# Patient Record
Sex: Female | Born: 1935 | Race: White | Hispanic: No | Marital: Married | Smoking: Never smoker
Health system: Southern US, Community
[De-identification: ages and names within clinical notes are randomized; demographics above are authoritative.]

## PROBLEM LIST (undated history)

## (undated) DIAGNOSIS — N2581 Secondary hyperparathyroidism of renal origin: Secondary | ICD-10-CM

## (undated) DIAGNOSIS — G2581 Restless legs syndrome: Secondary | ICD-10-CM

## (undated) DIAGNOSIS — I209 Angina pectoris, unspecified: Secondary | ICD-10-CM

## (undated) DIAGNOSIS — D631 Anemia in chronic kidney disease: Secondary | ICD-10-CM

## (undated) DIAGNOSIS — D509 Iron deficiency anemia, unspecified: Secondary | ICD-10-CM

## (undated) DIAGNOSIS — Z860100 Personal history of colon polyps, unspecified: Secondary | ICD-10-CM

## (undated) DIAGNOSIS — M1990 Unspecified osteoarthritis, unspecified site: Secondary | ICD-10-CM

## (undated) DIAGNOSIS — N039 Chronic nephritic syndrome with unspecified morphologic changes: Secondary | ICD-10-CM

## (undated) DIAGNOSIS — K5730 Diverticulosis of large intestine without perforation or abscess without bleeding: Secondary | ICD-10-CM

## (undated) DIAGNOSIS — Z951 Presence of aortocoronary bypass graft: Secondary | ICD-10-CM

## (undated) DIAGNOSIS — F329 Major depressive disorder, single episode, unspecified: Secondary | ICD-10-CM

## (undated) DIAGNOSIS — N186 End stage renal disease: Secondary | ICD-10-CM

## (undated) DIAGNOSIS — F419 Anxiety disorder, unspecified: Secondary | ICD-10-CM

## (undated) DIAGNOSIS — I120 Hypertensive chronic kidney disease with stage 5 chronic kidney disease or end stage renal disease: Secondary | ICD-10-CM

## (undated) DIAGNOSIS — N200 Calculus of kidney: Secondary | ICD-10-CM

## (undated) DIAGNOSIS — C801 Malignant (primary) neoplasm, unspecified: Secondary | ICD-10-CM

## (undated) DIAGNOSIS — F32A Depression, unspecified: Secondary | ICD-10-CM

## (undated) DIAGNOSIS — F319 Bipolar disorder, unspecified: Secondary | ICD-10-CM

## (undated) DIAGNOSIS — I2510 Atherosclerotic heart disease of native coronary artery without angina pectoris: Secondary | ICD-10-CM

## (undated) HISTORY — PX: ABDOMINAL HYSTERECTOMY: SHX81

## (undated) HISTORY — DX: Anxiety disorder, unspecified: F41.9

## (undated) HISTORY — DX: Major depressive disorder, single episode, unspecified: F32.9

## (undated) HISTORY — DX: Depression, unspecified: F32.A

## (undated) HISTORY — PX: EYE SURGERY: SHX253

---

## 2000-09-17 ENCOUNTER — Emergency Department (HOSPITAL_COMMUNITY): Admission: EM | Admit: 2000-09-17 | Discharge: 2000-09-17 | Payer: Self-pay

## 2000-09-17 ENCOUNTER — Encounter: Payer: Self-pay | Admitting: Emergency Medicine

## 2000-11-12 ENCOUNTER — Encounter (HOSPITAL_COMMUNITY): Admission: RE | Admit: 2000-11-12 | Discharge: 2001-02-10 | Payer: Self-pay | Admitting: Nephrology

## 2002-01-18 ENCOUNTER — Encounter: Payer: Self-pay | Admitting: Vascular Surgery

## 2002-01-19 ENCOUNTER — Ambulatory Visit (HOSPITAL_COMMUNITY): Admission: RE | Admit: 2002-01-19 | Discharge: 2002-01-19 | Payer: Self-pay | Admitting: Vascular Surgery

## 2002-01-19 HISTORY — PX: AV FISTULA PLACEMENT: SHX1204

## 2002-03-11 ENCOUNTER — Ambulatory Visit (HOSPITAL_COMMUNITY): Admission: RE | Admit: 2002-03-11 | Discharge: 2002-03-11 | Payer: Self-pay | Admitting: Gastroenterology

## 2002-03-11 ENCOUNTER — Encounter (INDEPENDENT_AMBULATORY_CARE_PROVIDER_SITE_OTHER): Payer: Self-pay | Admitting: *Deleted

## 2003-07-01 ENCOUNTER — Ambulatory Visit (HOSPITAL_COMMUNITY): Admission: RE | Admit: 2003-07-01 | Discharge: 2003-07-01 | Payer: Self-pay | Admitting: Nephrology

## 2004-10-21 DIAGNOSIS — K5730 Diverticulosis of large intestine without perforation or abscess without bleeding: Secondary | ICD-10-CM

## 2004-10-21 HISTORY — DX: Diverticulosis of large intestine without perforation or abscess without bleeding: K57.30

## 2005-04-01 ENCOUNTER — Ambulatory Visit (HOSPITAL_COMMUNITY): Admission: RE | Admit: 2005-04-01 | Discharge: 2005-04-01 | Payer: Self-pay | Admitting: Gastroenterology

## 2005-06-14 ENCOUNTER — Ambulatory Visit (HOSPITAL_COMMUNITY): Admission: RE | Admit: 2005-06-14 | Discharge: 2005-06-14 | Payer: Self-pay | Admitting: Nephrology

## 2005-07-01 ENCOUNTER — Encounter (HOSPITAL_COMMUNITY): Admission: RE | Admit: 2005-07-01 | Discharge: 2005-09-29 | Payer: Self-pay | Admitting: Nephrology

## 2005-10-09 ENCOUNTER — Encounter (HOSPITAL_COMMUNITY): Admission: RE | Admit: 2005-10-09 | Discharge: 2006-01-07 | Payer: Self-pay | Admitting: Nephrology

## 2006-01-20 ENCOUNTER — Encounter (HOSPITAL_COMMUNITY): Admission: RE | Admit: 2006-01-20 | Discharge: 2006-04-20 | Payer: Self-pay | Admitting: Critical Care Medicine

## 2006-12-10 ENCOUNTER — Emergency Department (HOSPITAL_COMMUNITY): Admission: EM | Admit: 2006-12-10 | Discharge: 2006-12-10 | Payer: Self-pay | Admitting: Emergency Medicine

## 2007-02-16 ENCOUNTER — Emergency Department (HOSPITAL_COMMUNITY): Admission: EM | Admit: 2007-02-16 | Discharge: 2007-02-16 | Payer: Self-pay | Admitting: *Deleted

## 2008-12-16 ENCOUNTER — Ambulatory Visit: Payer: Self-pay | Admitting: Vascular Surgery

## 2009-07-21 HISTORY — PX: AV FISTULA PLACEMENT: SHX1204

## 2009-07-28 ENCOUNTER — Ambulatory Visit: Payer: Self-pay | Admitting: Vascular Surgery

## 2009-08-10 ENCOUNTER — Ambulatory Visit: Payer: Self-pay | Admitting: Vascular Surgery

## 2009-08-10 ENCOUNTER — Ambulatory Visit (HOSPITAL_COMMUNITY): Admission: RE | Admit: 2009-08-10 | Discharge: 2009-08-10 | Payer: Self-pay | Admitting: Vascular Surgery

## 2009-09-08 ENCOUNTER — Ambulatory Visit: Payer: Self-pay | Admitting: Vascular Surgery

## 2009-12-08 ENCOUNTER — Ambulatory Visit: Payer: Self-pay | Admitting: Vascular Surgery

## 2009-12-19 HISTORY — PX: ARTERIOVENOUS GRAFT PLACEMENT: SUR1029

## 2010-01-09 ENCOUNTER — Ambulatory Visit (HOSPITAL_COMMUNITY): Admission: RE | Admit: 2010-01-09 | Discharge: 2010-01-09 | Payer: Self-pay | Admitting: Vascular Surgery

## 2010-01-09 ENCOUNTER — Ambulatory Visit: Payer: Self-pay | Admitting: Vascular Surgery

## 2010-02-06 ENCOUNTER — Ambulatory Visit: Payer: Self-pay | Admitting: Vascular Surgery

## 2010-03-02 ENCOUNTER — Ambulatory Visit: Payer: Self-pay | Admitting: Vascular Surgery

## 2010-11-20 ENCOUNTER — Ambulatory Visit: Admit: 2010-11-20 | Payer: Self-pay | Admitting: Vascular Surgery

## 2010-11-20 ENCOUNTER — Ambulatory Visit
Admission: RE | Admit: 2010-11-20 | Discharge: 2010-11-20 | Payer: Self-pay | Source: Home / Self Care | Attending: Vascular Surgery | Admitting: Vascular Surgery

## 2010-11-21 HISTORY — PX: ARTERIOVENOUS GRAFT PLACEMENT: SUR1029

## 2010-11-21 NOTE — Assessment & Plan Note (Addendum)
OFFICE VISIT  Diane [name], Diane [name] DOB:  [birth year]                                       11/20/2010 ZOXWR#:[id number]  The patient presents today for continued discussion regarding AV access. I last saw her in May 2011.  At that time, she had a failed left forearm loop graft.  She has continued to have progressive renal failure and is now approaching hemodialysis, and we are asked to see her for access placement.  She has had prior failed right lower and upper AV fistula, and left forearm loop artificial graft.  She has had prior vein mapping in October 2010 showing that she does not have any other veins suitable for a fistula.  I discussed options with the patient and her husband, and have recommend that we proceed with left upper arm AV graft placement.  She understands that this will fail and average duration is 6 to 9 months between failure.  She remains quite tearful and very emotional regarding this decision to proceed with hemodialysis.  I explained that the access would not hasten her need for dialysis but would hopefully be available if she has initiation of hemodialysis.  We have scheduled this at her convenience on 2/10 at [facility] as an outpatient.    [name], M.D. Electronically Signed  TFE/MEDQ  D:  11/20/2010  T:  11/21/2010  Job:  [id number]  cc:   Fayrene Fearing L. Deterding, M.D. Barry Dienes Eloise Harman, M.D.

## 2010-11-30 ENCOUNTER — Ambulatory Visit (HOSPITAL_COMMUNITY): Payer: Medicare Other

## 2010-11-30 ENCOUNTER — Ambulatory Visit (HOSPITAL_COMMUNITY)
Admission: RE | Admit: 2010-11-30 | Discharge: 2010-11-30 | Disposition: A | Discharge status: Home / Self Care | Payer: Medicare Other | Source: Ambulatory Visit | Attending: Vascular Surgery | Admitting: Vascular Surgery

## 2010-11-30 DIAGNOSIS — I120 Hypertensive chronic kidney disease with stage 5 chronic kidney disease or end stage renal disease: Secondary | ICD-10-CM

## 2010-11-30 DIAGNOSIS — N186 End stage renal disease: Secondary | ICD-10-CM

## 2010-11-30 DIAGNOSIS — N189 Chronic kidney disease, unspecified: Secondary | ICD-10-CM | POA: Insufficient documentation

## 2010-11-30 LAB — POCT I-STAT 4, (NA,K, GLUC, HGB,HCT)
Hemoglobin: 12.2 g/dL (ref 12.0–15.0)
Potassium: 5 mEq/L (ref 3.5–5.1)
Sodium: 145 mEq/L (ref 135–145)

## 2010-11-30 LAB — SURGICAL PCR SCREEN
MRSA, PCR: NEGATIVE
Staphylococcus aureus: NEGATIVE

## 2010-12-03 NOTE — Op Note (Addendum)
  NAMEVALERIE, Diane [name]           ACCOUNT NO.:  [id number]  MEDICAL RECORD NO.:  [id number]           PATIENT TYPE:  O  LOCATION:  SDSC                         FACILITY:  [facility]  PHYSICIAN:  [name], M.D.    DATE OF BIRTH:  08-28-1935  DATE OF PROCEDURE:  11/30/2010 DATE OF DISCHARGE:  11/30/2010                              OPERATIVE REPORT   PREOPERATIVE DIAGNOSIS:  Chronic renal insufficiency.  POSTOPERATIVE DIAGNOSIS:  Chronic renal insufficiency.  PROCEDURE:  Left upper arm arteriovenous Gore-Tex graft placement.  SURGEON:  [name], MD  ASSISTANT:  Nurse.  ANESTHESIA:  MAC.  COMPLICATIONS:  None.  DISPOSITION:  To recovery room stable.  PROCEDURE IN DETAIL:  The patient was taken to the operating room and placed supine on the operating table.  The area of left arm and left axilla were prepped and draped in a usual sterile fashion.  An incision was made over the axillary pulse, carried down to isolate the axillary vein which was of good caliber.  A separate incision was made using local anesthesia just above the antecubital space over the brachial pulse.  The artery was of small caliber, did have good pulse and the patient did have a 1 to 2+ radial pulse at the left wrist.  A tunnel was created in the subcutaneous tissue from the antecubital space to the axilla.  Due to the small caliber of the artery, 4-7 tapered graft was chosen.  The artery was occluded proximal and distally, was opened with blade, and extended longitudinally with Potts scissors.  A small arteriotomy was created.  The graft was spatulated and a 4-mm portion of the graft was sewn end-to-side of the artery with a running 6-0 Prolene suture.  Clamps were removed from the artery.  The graft was flushed with heparinized saline and reoccluded.  Next, the axillary artery was occluded proximally and distally, opened with a 11 blade, and extended longitudinally with Potts scissors.  The graft  was cut to appropriate length and sewn end-to-side of the vein with a running 6-0 Prolene suture.  Clamps were removed and good flow was noted through the graft. The patient did have diminished, but palpable pulse at the left wrist with the graft open and listening with Doppler, there was Doppler flow which augmented with occlusion of the graft.  Wounds were irrigated with saline.  Hemostasis was obtained with electrocautery.  Wounds were closed with 3-0 Vicryl in the subcutaneous and subcuticular tissue.  Benzoin and Steri-Strips were applied.     [name], M.D.     TFE/MEDQ  D:  11/30/2010  T:  12/01/2010  Job:  [id number]  Electronically Signed by Oletha Tolson M.D. on 12/03/2010 07:41:47 PM

## 2010-12-25 ENCOUNTER — Ambulatory Visit (INDEPENDENT_AMBULATORY_CARE_PROVIDER_SITE_OTHER): Payer: MEDICARE | Admitting: Vascular Surgery

## 2010-12-25 DIAGNOSIS — N186 End stage renal disease: Secondary | ICD-10-CM

## 2010-12-26 NOTE — Assessment & Plan Note (Signed)
OFFICE VISIT  Diane, [name] DOB:  1934/11/16                                       12/25/2010 ZOXWR#:[id number]  The patient presents today for follow-up of her left upper arm AV Gore- Tex graft placement on December 02, 2010.  She looks quite good today. She has no steal symptoms.  She has had minimal discomfort with her upper arm graft.  She does report occasional episodes of a numb sensation in her entire left arm.  This is not related to her left hand.  PHYSICAL EXAMINATION:  She does have a 2+ radial pulse.  Her axillary and antecubital incisions are healed quite nicely.  Her graft has an excellent thrill and bruit.  I am quite pleased with her initial follow-up, recommend we see her again on a p.r.n. basis.    [name], M.D. Electronically Signed  TFE/MEDQ  D:  12/25/2010  T:  12/26/2010  Job:  [id number]  cc:   Fayrene Fearing L. Deterding, M.D. Barry Dienes Eloise Harman, M.D.

## 2011-01-11 LAB — POCT I-STAT 4, (NA,K, GLUC, HGB,HCT): HCT: 37 % (ref 36.0–46.0)

## 2011-01-24 LAB — POCT I-STAT 4, (NA,K, GLUC, HGB,HCT)
Glucose, Bld: 136 mg/dL — ABNORMAL HIGH (ref 70–99)
Potassium: 4.2 mEq/L (ref 3.5–5.1)
Sodium: 148 mEq/L — ABNORMAL HIGH (ref 135–145)

## 2011-03-05 NOTE — Procedures (Signed)
LOWER EXTREMITY ARTERIAL EVALUATION-SINGLE LEVEL   INDICATION:  Followup evaluation of bilateral burning and leg pain at  night.   HISTORY:  Diabetes:  Yes.  Cardiac:  No.  Hypertension:  Yes.  Smoking:  No.  Previous Surgery:  Occluded right arm AV fistula.  The patient is not  currently on dialysis.   RESTING SYSTOLIC PRESSURES: (ABI)                          RIGHT                LEFT  Brachial:               124                  118  Anterior tibial:        124                  122  Posterior tibial:       128 (>1.0)           126 (>1.0)  Peroneal:  DOPPLER WAVEFORM ANALYSIS:  Anterior tibial:        Triphasic            Triphasic  Posterior tibial:       Triphasic            Triphasic  Peroneal:   PREVIOUS ABI'S:  Date:  11/26/2005  RIGHT:  >1.0  LEFT:  >1.0   IMPRESSION:  1. Doppler waveforms are triphasic in the tibial arteries bilaterally.  2. ABIs suggest no significant arterial occlusive disease bilaterally.   ___________________________________________  [name], M.D.   MC/MEDQ  D:  12/16/2008  T:  12/16/2008  Job:  [id number]

## 2011-03-05 NOTE — Assessment & Plan Note (Signed)
OFFICE VISIT   CAREN, GARSKE  DOB:  1935-02-22                                       12/08/2009  ZOXWR#:[id number]   Patient presents today for continued follow-up of AV access.  She had a  right upper arm AV fistula creation by myself in October 2010.  She had  a prior Cimino fistula done by myself in 2003.  She fortunately does not  need hemodialysis.  On my last visit in November, she did have a good  flow through the proximal portion of her vein at the antecubital space  and a smaller vein above the elbow.  Since that time, she has gone on to  occlude her upper arm AV fistula.  She pretends to have brachial pulses  bilaterally.  She is right-handed.  She does not know her specific  laboratory data but reports that she is holding stable from a renal  insufficiency standpoint.   PHYSICAL EXAMINATION:  A well-developed and well-nourished white female  appearing stated age.  Blood pressure is 128/75, pulse 78, temperature  97.6.  HEENT is normal.  Her brachial pulse is 2+.  She has 1+ radial  pulses bilaterally.  She has small surface veins bilaterally.  Musculoskeletal is without major deformities or cyanosis.  Neurologic:  No focal weakness or paresthesias.  Skin without ulcers or rashes.   I discussed the options with the patient and her husband.  I feel that  the next option would be a prosthetic Gore-Tex graft.  I would reserve  this for imminent need for hemodialysis.  I would recommend a left  forearm loop graft placement should she progress.     [name], M.D.  Electronically Signed   TFE/MEDQ  D:  12/08/2009  T:  12/11/2009  Job:  [id number]   cc:   Fayrene Fearing L. Deterding, M.D.

## 2011-03-05 NOTE — Assessment & Plan Note (Signed)
OFFICE VISIT   Diane [name]  DOB:  [birth year]                                       02/06/2010  ZOXWR#:[id number]   DATE OF SURGERY:  January 10, 2003   CHIEF COMPLAINT:  Follow-up left forearm loop AV Gore-Tex graft  placement.   HISTORY OF PRESENT ILLNESS:  Patient is a 76 year old woman who is not  yet on hemodialysis, who had a left forearm loop graft placed at the end  of March, approximately 4 weeks ago.  She has been doing well with  occasional numbness in her hand at night, which goes away fairly quickly  with motion of her hand.  She has had some swelling on the medial aspect  of the forearm, which is also hypersensitive.  Otherwise her wounds are  healing well without signs of infection.  There is some swelling around  the medial aspect of the graft, and this area is sensitive but not  tender.  She has been on antibiotics for an infection of the left great  toe and denies any fever or chills.   ASSESSMENT/PLAN:  Functioning arteriovenous Gore-Tex graft in a patient  who is non-hemodialysis with some swelling around 1 limb of the graft,  which does not appear to be infected.  She has an appointment to see Dr.  Darrick Penna in a couple of weeks.  If the swelling is not resolving or she  has any area of redness or increased pain in the area of the Gore-Tex  graft, we will see her back and check for signs of infection.   Diane Goo, PA-C   Quita Skye. Hart Rochester, [name].D.  Electronically Signed   RR/MEDQ  D:  02/06/2010  T:  02/06/2010  Job:  [id number]

## 2011-03-05 NOTE — Procedures (Signed)
CEPHALIC VEIN MAPPING   INDICATION:  Preop evaluation.   HISTORY:  Stage 4 chronic kidney disease, history of right wrist Cimino AV  fistula.   EXAM:  The right cephalic vein is compressible with diameter measurements  ranging from 0.16 to 0.37 cm.   The left cephalic vein is compressible with diameter measurements  ranging from 0.13 to 0.37 cm.   The left basilic vein is compressible with diameter measurements ranging  from 0.24 to 0.59 cm.   See attached worksheet for all measurements.   IMPRESSION:  Patent left basilic vein and bilateral cephalic veins with  diameter measurements described above and on the attached worksheet.   ___________________________________________  [name], M.D.   CH/MEDQ  D:  07/28/2009  T:  07/29/2009  Job:  [id number]

## 2011-03-05 NOTE — Assessment & Plan Note (Signed)
OFFICE VISIT   Diane [name], Diane [name]  DOB:  Apr 30, 1935                                       03/02/2010  ZOXWR#:[id number]   The patient presents today for continued discussion regarding AV access  for hemodialysis.  She is well-known to me from multiple prior  procedures.  This dates back in 2003 when she initially had a right  wrist Cimino AV fistula placement.  She had a long-standing stability of  her renal insufficiency and underwent right upper arm AV fistula  creation by myself in October 2010.  This failed to mature and  subsequently 2 months ago in March she underwent placement of a right  forearm loop graft.  She was seen in our office by our physician  assistant approximately a month ago with some mild steal symptoms which  she was tolerating in her left hand.  She subsequently was noted on  office visit with Dr. Darrick Penna to have an occlusion of her graft.  She  is here today for further discussion.   I had a very long discussion with the patient and her husband present.  As always, she has multiple concerns with multiple prior psychiatric  issues, concern regarding the treatment for iron therapy in the past and  multiple somatic complaints.  She is not on hemodialysis.  I do not have  her current renal function studies, but she reports that her creatinine  is in the low 3s and is stable.   On physical exam, she has obviously occluded her left forearm loop  graft.  I cannot palpate her radial pulse.  She reports that over the  past several weeks the numbness in her hand has resolved and I suspect  this is related to the occlusion of her graft.  I discussed options with  the patient and her husband present.  I do not feel there is any role  for attempted salvage or thrombectomy of her forearm loop graft for  several reasons.  She had early failure of this despite not being used  for access and I suspect she would have ongoing failure of a  forearm  loop graft.  It has been an indeterminant period of time that this has  occluded so it would be difficult to open the graft and she did have  moderate steal when this graft was patent.  I would suggest her next  option would be a left upper arm graft.  She is very emotional and does  not think that she wants to be initiated on hemodialysis and is not  willing to proceed with the graft at this time.  I did explain that if  she went on to severe renal insufficiency acutely she would require  short-term placement of a catheter and then would place a left upper arm  graft as well.  She understands and will continue her discussion with  Dr. Darrick Penna and see [place] on an as-needed basis.     [name], M.D.  Electronically Signed   TFE/MEDQ  D:  03/02/2010  T:  03/02/2010  Job:  [id number]   cc:   Fayrene Fearing L. Deterding, M.D.

## 2011-03-05 NOTE — Assessment & Plan Note (Signed)
OFFICE VISIT   KORRYN, PANCOAST  DOB:  12/28/1934                                       09/08/2009  VHQIO#:[id number]   Patient presents for followup today of her right upper arm AV fistula  creation on 08/10/09.   Her incision is quite well healed.  She had minimal discomfort following  the procedure.  She does have a good early maturation at 1 month out  from her procedure.  The vein at the antecubital space is quite large.  It becomes a smaller caliber just above the elbow.  When compressing the  vein at the upper arm, she does have a good early maturation in size as  well.  She has some very superficial tributary branches coming off this,  but nothing that looks to have competitive flow.   She will continue her arm exercise and see [place] again on an as-needed  basis.   [name], M.D.  Electronically Signed   TFE/MEDQ  D:  09/08/2009  T:  09/11/2009  Job:  [id number]   cc:   Fayrene Fearing L. Deterding, M.D.  Barry Dienes Eloise Harman, M.D.

## 2011-03-05 NOTE — Consult Note (Signed)
NEW PATIENT CONSULTATION   Diane [name], Diane [name]  DOB:  03-01-1935                                       12/16/2008  ZOXWR#:[id number]   The patient presents today for evaluation of itching, burning, stinging,  numbness, and pain in both lower extremities.  This has been quite  difficult for her and has been present for quite some time and is  progressive.  She has been frustrated that she has not been able to have  treatment for this or determine an etiology.  She does not have any  history of tissue loss.  She has had ingrown toenails over the past year  and she has had treatment for these with podiatrist with partial toenail  removal, and no difficulty healing these lesions.  She has many medical  problems.  She does have chronic renal insufficiency and actually had an  AV fistula placed by me with concern regarding imminent dialysis need in  2003.  This has subsequently occluded and she has never been on  dialysis.  She reports that she has had iron toxicity.  Multiple  positive review of systems for constipation, urinary frequency,  dizziness, nervousness, depression, bipolar disorder, joint pain, eye  sight changes, anemia.  She reports allergies to sulfa and Ambien.   PHYSICAL EXAM:  Well-developed, well-nourished white female appearing  stated age of 39.  She has 2+ radial and 2+ dorsalis pedis and posterior  tibial pulses bilaterally.  She has palpable popliteal pulses.  She has  some scattered telangiectasia over both lower extremities, more so at  the level of her ankles.   She underwent noninvasive vascular laboratory studies in our office and  this reveals normal ankle/arm index bilaterally and normal triphasic  wave forms bilaterally.  I had an extremely long discussion with the  patient and her husband present.  She was very frustrated and tearful in  not being able to determine the etiology of her pain.  I explained that,  it does appear to be  neuropathic-type pain, and certainly does not have  any arterial component.  She will continue discussion with Dr. Eloise Harman  and see me again on an as needed basis.   [name], [name].D.  Electronically Signed   TFE/MEDQ  D:  12/16/2008  T:  12/19/2008  Job:  [id number]   cc:   Barry Dienes. Eloise Harman, [name].D.  James L. Deterding, [name].D.

## 2011-03-05 NOTE — Assessment & Plan Note (Signed)
OFFICE VISIT   Diane [name], Diane [name]  DOB:  Dec 29, 1934                                       07/28/2009  WGNFA#:[id number]   This patient presents today for evaluation of AV access.  She has had  progressive renal insufficiency.  We are seeing her to discuss  initiation of access for potential planned dialysis.  She does have a  history of hypertension, anemia, bipolar disease, hyperparathyroidism,  hyperlipidemia.  She had undergone placement of a right wrist Cimino  fistula in 2003 when it was felt that she may be progressing to renal  failure.  Fortunately, her renal function stabilized and she has not  been on hemodialysis.  Her right wrist fistula did not mature and has  occluded.   I had a very long discussion with the patient and her husband present.  I explained options of AV graft, AV fistula and hemodialysis catheter.  She underwent vein mapping today and this showed inadequate vein for  fistula in her left arm.  Her upper arm cephalic vein is of moderate  size by ultrasound.  By physical exam, she does have palpable radial  pulses and moderate-sized antecubital vein.  I have recommend that we  proceed with right upper arm AV fistula creation.  She understands the  potential for non-maturation of this and need for potential additional  procedures to include AV graft.  She understands we will proceed with  this as an outpatient on 08/03/2009 at [facility].   [name], M.D.  Electronically Signed   TFE/MEDQ  D:  07/28/2009  T:  08/01/2009  Job:  [id number]

## 2011-03-08 NOTE — Consult Note (Signed)
NAMEBOYD, LITAKER           ACCOUNT NO.:  [id number]   MEDICAL RECORD NO.:  [id number]          PATIENT TYPE:  EMS   LOCATION:  MAJO                         FACILITY:  [facility]   PHYSICIAN:  Thornton Park. Daphine Deutscher, MD  DATE OF BIRTH:  [birth year]   DATE OF CONSULTATION:  DATE OF DISCHARGE:  02/16/2007                                 CONSULTATION   EMERGENCY ROOM CONSULTATION NOTE:   CHIEF COMPLAINT:  Perirectal abscess.   HISTORY OF PRESENT ILLNESS:  A 76 year old white female with about a  three-day history of evolving pain in her bottom.  She was seen by Dr.  Jarold Motto, or at least contacted his office, and was referred to the  emergency department.  I was called by Dr. Deretha Emory to see the patient.  Patient was seen in Room 17 in the ED and found to have a right  fluctuant area consistent with a right perirectal abscess.  It was about  to start draining.  I went ahead and painted it with Betadine and  infiltrated it with 1% lidocaine.  An 11 blade was then used to cut a  stellate opening in this and then the abscess cavity was probed with  this blade, and pus poured out.  The fluctuant area and the indurated  area went down considerably and I then probed it with a Hemostat.  No  further pus was found and I went ahead and irrigated this with hydrogen  peroxide.   The patient has been given ampicillin and Diflucan to take by Dr. Ivery Quale and I did discuss the followup, and since he has begun  antibiotics, I asked her to contact his office for followup on Friday.  If she is no better on Wednesday, she should contact us or his office.  In addition, told her to sit in a bath tub with warm Epsom salt water  three to four times a day.   IMPRESSION:  Right perirectal abscess, status post incision and  drainage.      Thornton Park Daphine Deutscher, MD  Electronically Signed     MBM/MEDQ  D:  02/16/2007  T:  02/17/2007  Job:  [id number]   cc:   Dr. Ivery Quale

## 2011-03-08 NOTE — Op Note (Signed)
[place]  Patient:    Diane [name], Diane [name] Visit Number: [id number] MRN: [id number]          Service Type: DSU Location: [facility] 2899 22 Attending Physician:  Alyson Locket Dictated by:   [name], M.D. Proc. Date: 01/19/02 Admit Date:  01/19/2002 Discharge Date: 01/19/2002                             Operative Report  PREOPERATIVE DIAGNOSIS:  Progressive renal insufficiency.  POSTOPERATIVE DIAGNOSIS:  Progressive renal insufficiency.  PROCEDURE:  Creation of right wrist Cimino arteriovenous fistula.  SURGEON:  [name], M.D.  ASSISTANT:  Tollie Pizza. Collins, P.A.-C.  ANESTHESIA:  MAC.  COMPLICATIONS:  None.  DISPOSITION:  To recovery room stable.  DESCRIPTION OF PROCEDURE:  The patient was taken to the operating room and placed in the supine position, where the area of the right arm was prepped and draped in the usual sterile fashion.  Using local anesthesia, an incision made between the level of the cephalic vein and the radial artery at the wrist. The radial artery was isolated and was of moderate size.  The cephalic vein was isolated and was ligated distally and was generally divided.  The vein was small to moderate size.  The vein was brought into approximation with the radial artery.  The artery was occluded proximally and distally and was opened with a #11 blade and extended longitudinally with Potts scissors.  The radial artery to cephalic vein anastomosis was then accomplished end-to-side with a running 6-0 Prolene suture.  Clamps were removed, and good thrill was noted. The wounds were irrigated with saline, hemostased with electrocautery.  Wounds were closed with 3-0 Vicryl in the subcutaneous and subcuticular tissue, benzoin and Steri-Strips were applied. Dictated by:   [name], M.D. Attending Physician:  Alyson Locket DD:  01/19/02 TD:  01/20/02 Job: [id number]

## 2011-03-08 NOTE — Procedures (Signed)
[place]  Patient:    Diane [name], Diane [name] Visit Number: [id number] MRN: [id number]          Service Type: END Location: ENDO Attending Physician:  Orland Mustard Dictated by:   Llana Aliment. Randa Evens, M.D. Proc. Date: 03/11/02 Admit Date:  03/11/2002 Discharge Date: 03/11/2002   CC:         Barry Dienes. Eloise Harman, M.D.   Procedure Report  PROCEDURE:  Colonoscopy and coagulation of polyps.  MEDICATIONS:  Fentanyl 150 mcg, Versed 12 mg IV.  SCOPE:  Olympus pediatric video colonoscope.  INDICATION:  Strong family history of colon cancer, two sisters had polyps, one has had cancer.  There is also Crohns disease in the family.  This is done as a test due to the strong family history.  DESCRIPTION OF PROCEDURE:  The procedure had been explained to the patient and consent obtained.  With the patient in the left lateral decubitus position, a digital exam was performed and the pediatric Olympus video colonoscope was inserted and advanced under direct visualization.  The prep was quite good. The patient had extensive diverticular disease.  Some time was taken to pass this area.  Once we passed it we were able to advance rapidly to the cecum. The ileocecal valve and the appendiceal orifice were seen.  The scope was withdrawn and the colon carefully examined.  The ascending colon and descending colon were seen well and were free of polyps.  In the sigmoid colon at 40 cm from the anal verge in the middle of extensive diverticular disease, a 3 mm sessile polyp was encountered.  It was cauterized and removed.  No other polyps were seen in the sigmoid colon and the rectum was free of polyps.  ASSESSMENT: 1. Sigmoid colon polyp removed. 2. Severe diverticular disease.  PLAN:  Will check pathology.  In view of family history, definitely need a repeat in three years as this is likely to be an adenomatous polyp.  Will plan on doing this when the patient so,  of course, the patient can receive Propofol due to her difficulty with sedation.  Routine postpolypectomy instructions. Dictated by:   Llana Aliment. Randa Evens, M.D. Attending Physician:  Orland Mustard DD:  03/11/02 TD:  03/13/02 Job: [id number]

## 2011-03-08 NOTE — Op Note (Signed)
NAME:  Diane [name], Diane [name]           ACCOUNT NO.:  [id number]   MEDICAL RECORD NO.:  [id number]          PATIENT TYPE:  AMB   LOCATION:  ENDO                         FACILITY:  [facility]   PHYSICIAN:  James L. Malon Kindle., M.D.DATE OF BIRTH:  [birth year]   DATE OF PROCEDURE:  04/01/2005  DATE OF DISCHARGE:                                 OPERATIVE REPORT   PROCEDURE:  Colonoscopy.   MEDICATIONS:  1.  Fentanyl 100 mcg.  2.  Versed 10 mg IV.   INDICATIONS:  The patient has a very strong family history of colon cancer,  two sisters who have had polyps, one who has had cancer.  She had a polyp  herself removed three years ago, as well as extensive diverticular disease.  This is done as a three-year followup.   DESCRIPTION OF PROCEDURE:  The procedure was explained to the patient and  consent obtained.  In the left lateral decubitus position, a digital exam  was performed and the scope inserted.  The prep was excellent.  Extensive  diverticular disease of the sigmoid colon.  Finally, we were able to pass  with the patient in the supine position, able to advanced fairly easily to  the cecum.  The ileocecal valve was quite prominent.  Appendiceal orifice  and ileocecal valve seen.  The scope was withdrawn, and the cecum, ascending  colon, transverse, and descending colon were seen well.  Extensive  diverticular disease in the sigmoid colon, but no polyps seen.  The rectum  was free of polyps.  The scope was withdrawn.  The patient tolerated the  procedure well.   ASSESSMENT:  1.  History of colon polyps, with negative colonoscopy at this time.      V12.72.  2.  Diverticulosis, extensive, sigmoid colon.  562.10.   I will recommend yearly hemoccults and repeat colonoscopy in five years.       JLE/MEDQ  D:  04/01/2005  T:  04/01/2005  Job:  [id number]   cc:   Barry Dienes. Eloise Harman, M.D.  [address]  [place]  [place] [address]  Fax: [phone]   Llana Aliment. Malon Kindle., M.D.  [address], [address]  [place]  [place] [address]  Fax: [phone]

## 2012-05-25 ENCOUNTER — Other Ambulatory Visit: Payer: Self-pay | Admitting: Gastroenterology

## 2012-08-07 ENCOUNTER — Ambulatory Visit
Admission: RE | Admit: 2012-08-07 | Discharge: 2012-08-07 | Disposition: A | Discharge status: Home / Self Care | Payer: Medicare Other | Source: Ambulatory Visit | Attending: Cardiovascular Disease | Admitting: Cardiovascular Disease

## 2012-08-07 ENCOUNTER — Other Ambulatory Visit: Payer: Self-pay | Admitting: Cardiovascular Disease

## 2012-08-07 DIAGNOSIS — R079 Chest pain, unspecified: Secondary | ICD-10-CM

## 2012-08-07 DIAGNOSIS — Z01818 Encounter for other preprocedural examination: Secondary | ICD-10-CM

## 2012-08-10 ENCOUNTER — Other Ambulatory Visit: Payer: Self-pay | Admitting: Cardiovascular Disease

## 2012-08-10 ENCOUNTER — Encounter (HOSPITAL_COMMUNITY): Payer: Self-pay | Admitting: Pharmacy Technician

## 2012-08-12 ENCOUNTER — Encounter (HOSPITAL_COMMUNITY)
Admission: RE | Disposition: A | Discharge status: Skilled Nursing Facility | Payer: Self-pay | Source: Ambulatory Visit | Attending: Surgery

## 2012-08-12 ENCOUNTER — Other Ambulatory Visit: Payer: Self-pay

## 2012-08-12 ENCOUNTER — Encounter (HOSPITAL_COMMUNITY): Payer: Self-pay | Admitting: Nephrology

## 2012-08-12 ENCOUNTER — Inpatient Hospital Stay (HOSPITAL_COMMUNITY)
Admission: RE | Admit: 2012-08-12 | Discharge: 2012-08-21 | DRG: 233 | Disposition: A | Discharge status: Skilled Nursing Facility | Payer: Medicare Other | Source: Ambulatory Visit | Attending: Surgery | Admitting: Surgery

## 2012-08-12 DIAGNOSIS — N186 End stage renal disease: Secondary | ICD-10-CM | POA: Diagnosis present

## 2012-08-12 DIAGNOSIS — M25569 Pain in unspecified knee: Secondary | ICD-10-CM | POA: Diagnosis present

## 2012-08-12 DIAGNOSIS — I2510 Atherosclerotic heart disease of native coronary artery without angina pectoris: Secondary | ICD-10-CM | POA: Diagnosis present

## 2012-08-12 DIAGNOSIS — R5381 Other malaise: Secondary | ICD-10-CM | POA: Diagnosis present

## 2012-08-12 DIAGNOSIS — F319 Bipolar disorder, unspecified: Secondary | ICD-10-CM | POA: Diagnosis present

## 2012-08-12 DIAGNOSIS — R9439 Abnormal result of other cardiovascular function study: Secondary | ICD-10-CM | POA: Diagnosis present

## 2012-08-12 DIAGNOSIS — G609 Hereditary and idiopathic neuropathy, unspecified: Secondary | ICD-10-CM | POA: Diagnosis present

## 2012-08-12 DIAGNOSIS — E870 Hyperosmolality and hypernatremia: Secondary | ICD-10-CM | POA: Diagnosis present

## 2012-08-12 DIAGNOSIS — Z992 Dependence on renal dialysis: Secondary | ICD-10-CM

## 2012-08-12 DIAGNOSIS — I959 Hypotension, unspecified: Secondary | ICD-10-CM | POA: Diagnosis present

## 2012-08-12 DIAGNOSIS — R079 Chest pain, unspecified: Principal | ICD-10-CM | POA: Diagnosis present

## 2012-08-12 DIAGNOSIS — D649 Anemia, unspecified: Secondary | ICD-10-CM | POA: Diagnosis present

## 2012-08-12 DIAGNOSIS — I120 Hypertensive chronic kidney disease with stage 5 chronic kidney disease or end stage renal disease: Secondary | ICD-10-CM | POA: Diagnosis present

## 2012-08-12 DIAGNOSIS — Z951 Presence of aortocoronary bypass graft: Secondary | ICD-10-CM

## 2012-08-12 HISTORY — DX: Presence of aortocoronary bypass graft: Z95.1

## 2012-08-12 HISTORY — DX: Bipolar disorder, unspecified: F31.9

## 2012-08-12 HISTORY — DX: Restless legs syndrome: G25.81

## 2012-08-12 HISTORY — DX: Secondary hyperparathyroidism of renal origin: N25.81

## 2012-08-12 HISTORY — DX: Iron deficiency anemia, unspecified: D50.9

## 2012-08-12 HISTORY — DX: Hypertensive chronic kidney disease with stage 5 chronic kidney disease or end stage renal disease: I12.0

## 2012-08-12 HISTORY — DX: Chronic nephritic syndrome with unspecified morphologic changes: N03.9

## 2012-08-12 HISTORY — PX: CARDIAC CATHETERIZATION: SHX172

## 2012-08-12 HISTORY — PX: LEFT AND RIGHT HEART CATHETERIZATION WITH CORONARY/GRAFT ANGIOGRAM: SHX5448

## 2012-08-12 HISTORY — DX: Atherosclerotic heart disease of native coronary artery without angina pectoris: I25.10

## 2012-08-12 HISTORY — DX: Personal history of colonic polyps: Z86.010

## 2012-08-12 HISTORY — DX: Anemia in chronic kidney disease: D63.1

## 2012-08-12 HISTORY — DX: End stage renal disease: N18.6

## 2012-08-12 HISTORY — DX: Personal history of colon polyps, unspecified: Z86.0100

## 2012-08-12 HISTORY — DX: Diverticulosis of large intestine without perforation or abscess without bleeding: K57.30

## 2012-08-12 HISTORY — DX: Angina pectoris, unspecified: I20.9

## 2012-08-12 LAB — CBC
Platelets: 141 10*3/uL — ABNORMAL LOW (ref 150–400)
RBC: 4.15 MIL/uL (ref 3.87–5.11)
WBC: 6.2 10*3/uL (ref 4.0–10.5)

## 2012-08-12 LAB — PROTIME-INR
INR: 1.01 (ref 0.00–1.49)
Prothrombin Time: 13.2 seconds (ref 11.6–15.2)

## 2012-08-12 LAB — APTT: aPTT: 29 seconds (ref 24–37)

## 2012-08-12 LAB — GLUCOSE, CAPILLARY: Glucose-Capillary: 105 mg/dL — ABNORMAL HIGH (ref 70–99)

## 2012-08-12 SURGERY — LEFT AND RIGHT HEART CATHETERIZATION WITH CORONARY/GRAFT ANGIOGRAM

## 2012-08-12 MED ORDER — SODIUM CHLORIDE 0.9 % IV SOLN
125.0000 mg | Freq: Once | INTRAVENOUS | Status: AC
  Administered 2012-08-13: 125 mg via INTRAVENOUS
  Filled 2012-08-12 (×2): qty 10

## 2012-08-12 MED ORDER — SODIUM CHLORIDE 0.9 % IV SOLN: 100.0000 mL | INTRAVENOUS | Status: DC | PRN

## 2012-08-12 MED ORDER — HYDROXYZINE HCL 25 MG PO TABS
25.0000 mg | ORAL_TABLET | Freq: Three times a day (TID) | ORAL | Status: DC | PRN
  Filled 2012-08-12: qty 1

## 2012-08-12 MED ORDER — ACETAMINOPHEN 325 MG PO TABS: 650.0000 mg | ORAL_TABLET | ORAL | Status: DC | PRN

## 2012-08-12 MED ORDER — SORBITOL 70 % SOLN
30.0000 mL | Status: DC | PRN
  Filled 2012-08-12: qty 30

## 2012-08-12 MED ORDER — PARICALCITOL 5 MCG/ML IV SOLN
1.0000 ug | INTRAVENOUS | Status: DC
  Administered 2012-08-13 – 2012-08-20 (×3): 1 ug via INTRAVENOUS
  Filled 2012-08-12 (×5): qty 0.2

## 2012-08-12 MED ORDER — ACETAMINOPHEN 650 MG RE SUPP: 650.0000 mg | Freq: Four times a day (QID) | RECTAL | Status: DC | PRN

## 2012-08-12 MED ORDER — ZOLPIDEM TARTRATE 5 MG PO TABS: 5.0000 mg | ORAL_TABLET | Freq: Every evening | ORAL | Status: DC | PRN

## 2012-08-12 MED ORDER — RENA-VITE PO TABS
1.0000 | ORAL_TABLET | Freq: Every day | ORAL | Status: DC
  Administered 2012-08-12 – 2012-08-20 (×8): 1 via ORAL
  Filled 2012-08-12 (×10): qty 1

## 2012-08-12 MED ORDER — ~~LOC~~ CARDIAC SURGERY, PATIENT & FAMILY EDUCATION
Freq: Once | Status: AC
  Administered 2012-08-12: 21:00:00
  Filled 2012-08-12: qty 1

## 2012-08-12 MED ORDER — ALPRAZOLAM 0.25 MG PO TABS
0.2500 mg | ORAL_TABLET | Freq: Three times a day (TID) | ORAL | Status: DC | PRN
  Administered 2012-08-12 – 2012-08-13 (×4): 0.25 mg via ORAL
  Filled 2012-08-12 (×5): qty 1

## 2012-08-12 MED ORDER — FENTANYL CITRATE 0.05 MG/ML IJ SOLN
INTRAMUSCULAR | Status: AC
  Filled 2012-08-12: qty 2

## 2012-08-12 MED ORDER — ONDANSETRON HCL 4 MG PO TABS: 4.0000 mg | ORAL_TABLET | Freq: Four times a day (QID) | ORAL | Status: DC | PRN

## 2012-08-12 MED ORDER — PENTAFLUOROPROP-TETRAFLUOROETH EX AERO
1.0000 "application " | INHALATION_SPRAY | CUTANEOUS | Status: DC | PRN
  Filled 2012-08-12: qty 103.5

## 2012-08-12 MED ORDER — ALTEPLASE 2 MG IJ SOLR
2.0000 mg | Freq: Once | INTRAMUSCULAR | Status: AC | PRN
  Filled 2012-08-12: qty 2

## 2012-08-12 MED ORDER — ACETAMINOPHEN 325 MG PO TABS: 650.0000 mg | ORAL_TABLET | Freq: Four times a day (QID) | ORAL | Status: DC | PRN

## 2012-08-12 MED ORDER — HEPARIN (PORCINE) IN NACL 2-0.9 UNIT/ML-% IJ SOLN
INTRAMUSCULAR | Status: AC
  Filled 2012-08-12: qty 1000

## 2012-08-12 MED ORDER — LIDOCAINE HCL (PF) 1 % IJ SOLN
INTRAMUSCULAR | Status: AC
  Filled 2012-08-12: qty 30

## 2012-08-12 MED ORDER — GABAPENTIN 100 MG PO CAPS
200.0000 mg | ORAL_CAPSULE | Freq: Every day | ORAL | Status: DC
  Administered 2012-08-12 – 2012-08-20 (×8): 200 mg via ORAL
  Filled 2012-08-12 (×10): qty 2

## 2012-08-12 MED ORDER — MIDAZOLAM HCL 2 MG/2ML IJ SOLN
INTRAMUSCULAR | Status: AC
  Filled 2012-08-12: qty 2

## 2012-08-12 MED ORDER — KETOTIFEN FUMARATE 0.025 % OP SOLN: 1.0000 [drp] | Freq: Two times a day (BID) | OPHTHALMIC | Status: DC

## 2012-08-12 MED ORDER — NITROGLYCERIN 0.4 MG SL SUBL: 0.4000 mg | SUBLINGUAL_TABLET | SUBLINGUAL | Status: DC | PRN

## 2012-08-12 MED ORDER — ISOSORBIDE MONONITRATE ER 30 MG PO TB24
30.0000 mg | ORAL_TABLET | Freq: Every day | ORAL | Status: DC
  Administered 2012-08-12 – 2012-08-13 (×2): 30 mg via ORAL
  Filled 2012-08-12 (×3): qty 1

## 2012-08-12 MED ORDER — SODIUM CHLORIDE 0.9 % IV SOLN: 250.0000 mL | INTRAVENOUS | Status: DC | PRN

## 2012-08-12 MED ORDER — SODIUM CHLORIDE 0.9 % IV SOLN
INTRAVENOUS | Status: DC
  Administered 2012-08-12: 12:00:00 via INTRAVENOUS

## 2012-08-12 MED ORDER — OLOPATADINE HCL 0.1 % OP SOLN
1.0000 [drp] | Freq: Two times a day (BID) | OPHTHALMIC | Status: DC | PRN
  Filled 2012-08-12: qty 5

## 2012-08-12 MED ORDER — LIDOCAINE HCL (PF) 1 % IJ SOLN: 5.0000 mL | INTRAMUSCULAR | Status: DC | PRN

## 2012-08-12 MED ORDER — ONDANSETRON HCL 4 MG/2ML IJ SOLN: 4.0000 mg | Freq: Four times a day (QID) | INTRAMUSCULAR | Status: DC | PRN

## 2012-08-12 MED ORDER — HEPARIN (PORCINE) IN NACL 100-0.45 UNIT/ML-% IJ SOLN
800.0000 [IU]/h | INTRAMUSCULAR | Status: DC
  Administered 2012-08-12: 650 [IU]/h via INTRAVENOUS
  Filled 2012-08-12 (×2): qty 250

## 2012-08-12 MED ORDER — HYDROXYZINE PAMOATE 50 MG PO CAPS
50.0000 mg | ORAL_CAPSULE | Freq: Every day | ORAL | Status: DC
  Administered 2012-08-12 – 2012-08-20 (×5): 50 mg via ORAL
  Filled 2012-08-12 (×10): qty 1

## 2012-08-12 MED ORDER — SODIUM CHLORIDE 0.9 % IV SOLN: INTRAVENOUS | Status: DC

## 2012-08-12 MED ORDER — CALCIUM CARBONATE 1250 MG/5ML PO SUSP
500.0000 mg | Freq: Four times a day (QID) | ORAL | Status: DC | PRN
  Filled 2012-08-12: qty 5

## 2012-08-12 MED ORDER — METOPROLOL TARTRATE 25 MG PO TABS
25.0000 mg | ORAL_TABLET | Freq: Two times a day (BID) | ORAL | Status: DC
  Administered 2012-08-12 – 2012-08-13 (×3): 25 mg via ORAL
  Filled 2012-08-12 (×5): qty 1

## 2012-08-12 MED ORDER — DOCUSATE SODIUM 283 MG RE ENEM
1.0000 | ENEMA | RECTAL | Status: DC | PRN
  Filled 2012-08-12: qty 1

## 2012-08-12 MED ORDER — LIDOCAINE-PRILOCAINE 2.5-2.5 % EX CREA
1.0000 "application " | TOPICAL_CREAM | CUTANEOUS | Status: DC | PRN
  Filled 2012-08-12: qty 5

## 2012-08-12 MED ORDER — HEPARIN SODIUM (PORCINE) 1000 UNIT/ML DIALYSIS: 1000.0000 [IU] | INTRAMUSCULAR | Status: DC | PRN

## 2012-08-12 MED ORDER — TRAMADOL HCL 50 MG PO TABS: 50.0000 mg | ORAL_TABLET | Freq: Four times a day (QID) | ORAL | Status: DC | PRN

## 2012-08-12 MED ORDER — POLYETHYLENE GLYCOL 3350 17 G PO PACK
17.0000 g | PACK | Freq: Every day | ORAL | Status: DC | PRN
  Filled 2012-08-12: qty 1

## 2012-08-12 MED ORDER — ASPIRIN 81 MG PO CHEW
324.0000 mg | CHEWABLE_TABLET | ORAL | Status: AC
  Administered 2012-08-12: 324 mg via ORAL
  Filled 2012-08-12: qty 4

## 2012-08-12 MED ORDER — NITROGLYCERIN 0.2 MG/ML ON CALL CATH LAB
INTRAVENOUS | Status: AC
  Filled 2012-08-12: qty 1

## 2012-08-12 MED ORDER — ZOLPIDEM TARTRATE 5 MG PO TABS
5.0000 mg | ORAL_TABLET | Freq: Every evening | ORAL | Status: DC | PRN
  Filled 2012-08-12: qty 1

## 2012-08-12 MED ORDER — HEPARIN SODIUM (PORCINE) 1000 UNIT/ML DIALYSIS
20.0000 [IU]/kg | INTRAMUSCULAR | Status: DC | PRN
  Filled 2012-08-12: qty 1

## 2012-08-12 MED ORDER — DIAZEPAM 5 MG PO TABS
5.0000 mg | ORAL_TABLET | ORAL | Status: AC
  Administered 2012-08-12: 5 mg via ORAL
  Filled 2012-08-12: qty 1

## 2012-08-12 MED ORDER — SODIUM CHLORIDE 0.9 % IJ SOLN: 3.0000 mL | Freq: Two times a day (BID) | INTRAMUSCULAR | Status: DC

## 2012-08-12 MED ORDER — DEXTROSE-NACL 5-0.45 % IV SOLN
INTRAVENOUS | Status: DC
  Administered 2012-08-12: 09:00:00 via INTRAVENOUS

## 2012-08-12 MED ORDER — SODIUM CHLORIDE 0.9 % IJ SOLN: 3.0000 mL | INTRAMUSCULAR | Status: DC | PRN

## 2012-08-12 MED ORDER — SEVELAMER CARBONATE 800 MG PO TABS
800.0000 mg | ORAL_TABLET | Freq: Every day | ORAL | Status: DC
  Administered 2012-08-13 – 2012-08-21 (×8): 800 mg via ORAL
  Filled 2012-08-12 (×10): qty 1

## 2012-08-12 MED ORDER — CAMPHOR-MENTHOL 0.5-0.5 % EX LOTN
1.0000 "application " | TOPICAL_LOTION | Freq: Three times a day (TID) | CUTANEOUS | Status: DC | PRN
  Filled 2012-08-12: qty 222

## 2012-08-12 NOTE — Progress Notes (Signed)
Full consult note pending. She has severe 3 vessel CAD and I agree it is most amenable to CABG. I spent over an hour talking with her and husband about surgery and she can't decide if she wants to have it done. I told her I would return tomorrow to see what she thinks. I can do it Friday if she wants to proceed.

## 2012-08-12 NOTE — Cardiovascular Report (Signed)
NAMELINDSEE, LABARRE NO.:  [id number]  MEDICAL RECORD NO.:  [id number]  LOCATION:  6529                         FACILITY:  [facility]  PHYSICIAN:  Nicki Guadalajara, M.D.     DATE OF BIRTH:  March 31, 1935  DATE OF PROCEDURE:  08/12/2012 DATE OF DISCHARGE:                           CARDIAC CATHETERIZATION   INDICATIONS:  Ms. Diane [name] is a 77 year old female who has end- stage renal disease, on dialysis, currently receiving this on Tuesday, Thursday, and Saturdays.  She had recently experienced episodes of chest pain as well as tachycardia.  Nuclear perfusion study revealed mild-to- moderate ischemia in the midanterior to anterolateral wall towards the apex suggesting of mid-distal LAD ischemia.  She has also been noted to have heart rates up to 150 beats per minute.  She now presents for definitive diagnostic cardiac catheterization following initiation of antianginal treatment, which has reduced her chest pain symptomatology. She has had difficulty with hypotension during dialysis.  PROCEDURE:  After premedication with Versed 2 mg plus fentanyl 25 mcg, the patient was prepped and draped in usual fashion.  Her right femoral artery was punctured anteriorly and a 5-French sheath was inserted without difficulty.  Diagnostic catheterization was done utilizing 5- [place] Judkins 4 left and right coronary catheters.  The right catheter was also used in attempt to selectively engage the left subclavian artery.  A pigtail catheter, 5-French, was used for RAO ventriculography.  Aortic arch injection was then made to identify the great vessels to reassess potential subclavian occlusion and also this was then pan down to visualize the abdominal aorta.  Hemostasis was obtained by direct manual pressure.  The patient tolerated the procedure well.  HEMODYNAMIC DATA:  Central aortic pressure was 80/44, left ventricular pressure 80/9, post A-wave 15.  ANGIOGRAPHIC DATA:  Left  main coronary artery was angiographically normal and bifurcating into an LAD and left circumflex system.  The LAD had ostial smooth 30-40% narrowing before the first septal perforating artery.  After the septal perforating artery, the LAD gave rise to bifurcating diagonal vessel.  The superficial branch of this diagonal vessel had 99% stenosis with suggestion of possible thrombus with TIMI 2 flow into the superior branch beyond this lesion.  The LAD right after the diagonal takeoff had 90% stenosis.  The LAD extended to and wrapped around the LV apex and had TIMI 3 flow beyond the stenosis.  The circumflex vessel had 70-80% narrowing at the proximal portion of the marginal vessel just after small AV groove takeoff.  The right coronary artery was a large caliber dominant vessel that had 70-80% proximal stenosis followed by 50% mid-stenosis.  There was also 80% ostial PDA stenosis.  An initial scout injection into the subclavian artery seemed to indicate that this was totally occluded and a nubbin.  However, with pigtail catheter in the aortic root, angiography did reveal a patent right innominate, left carotid, and there did appear to be filling of subclavian vessel.  It was hard to discern the previous "nubbin" which was selectively engaged with the right coronary catheter.  RAO ventriculography revealed moderate left ventricular hypertrophy. Ejection fraction was at least 60%.  There was no significant wall motion abnormalities without significant hypokinesis in  the mid distal anterolateral wall concordant with viable myocardium in this ischemic territory documented on nuclear imaging.  IMPRESSION: 1. Moderate left ventricular hypertrophy with normal systolic     function.  Ejection fraction at least 60%. 2. Multivessel coronary artery disease with 30-40% ostial left     anterior descending artery stenosis, 99% stenosis in the superior     branch of the first diagonal vessel with  TIMI 2 flow beyond this     subtotal stenosis, a 90% left anterior descending artery stenosis     just after the septal perforator and first diagonal vessel; 70-80%     circumflex marginal stenosis; 70-80%% proximal right coronary     artery stenosis followed by 50% mid-stenosis, and 80% ostial PDA     stenosis on the dominant right coronary artery. 3. Peripheral vascular disease with vessel tortuosity and question     occlusion/diverticulum region of the aortic arch as described     above.  RECOMMENDATION:  Cardiothoracic surgical consultation will be obtained. The patient does have multivessel CAD.  She is on dialysis, currently getting treatments on Tuesday, Thursday, and Friday.  She does have TIMI 2 flow with subtotal occlusion of her superior branch of her diagonal 1 vessel.  She will be started on anticoagulation pending surgical evaluation.  If the patient is turned down for CABG surgery, consideration for multivessel percutaneous coronary intervention will be undertaken.          ______________________________ Nicki Guadalajara, M.D.     TK/MEDQ  D:  08/12/2012  T:  08/12/2012  Job:  [id number]  cc:   Barry Dienes. Eloise Harman, M.D. Dyke Maes, M.D.

## 2012-08-12 NOTE — Consult Note (Signed)
[facility] Renal Consultation Note    Indication for Consultation:  Management of ESRD/hemodialysis; anemia, hypertension/volume and secondary hyperparathyroidism  HPI: Diane [name] is a 77 y.o. 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 Tresa Endo for several years regarding cardiac issues, but has been recently seen for chest pressure/burning on HD at times and also chest heaviness.  This may happen on dialysis but also at home it occurred several weeks ago while vacuuming.  She also has at times had DOE and orthopnea, though not consistently, tachy arhythmias and palpitations.. These symptoms have improved with the addition of imdur. As part of her evaluation by Dr. Tresa Endo she had an abnormal nuclear perfusion scan 08/05/12 compared with 04/2010 which prompted cardiac cath today (with results as described below.).  She also has occasional dizziness, weakness, severe LE burning and pins/needles.  Her appetite has been good and no N, V, D, fever or chlls.  Past Medical History  Diagnosis Date  . Anemia in chronic kidney disease(285.21)   . Bipolar disorder, unspecified   . Iron deficiency anemia, unspecified   . Secondary hyperparathyroidism (of renal origin)   . Restless legs syndrome (RLS)   . Unspecified hypertensive kidney disease with chronic kidney disease stage V or end stage renal disease   . 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 Personal history of colonic polyps   . Diverticulosis of colon (without mention of hemorrhage) 2006    Colonoscopy Dr. Randa Evens   Past Surgical History  Procedure Date  . Arteriovenous graft placement 11/2010    left upper - AVGG Dr. Arbie Cookey  . Arteriovenous graft placement 12/2009    left lower - AVGG Dr. Arbie Cookey  . Av fistula placement 07/2009    right upper AVF Dr. Arbie Cookey  . Av fistula placement 01/2002   right lower Dr. Arbie Cookey   Family History: Mother died from CVA; Father died from MI.  All brothers and sisters have heart disease; Sister also had CVA  Social History: Married x 55 years with 3 adult children; 6 grandchildren and 5 great grand childrent. She does not smoke or use etoh.   Allergies  Allergen Reactions  . Sulfa Antibiotics Other (See Comments)    Migraine   . Tape Rash    Can only use paper tape    Prior to Admission medications   Medication Sig Start Date End Date Taking? Authorizing Provider  acetaminophen (TYLENOL) 500 MG tablet Take 1,000 mg by mouth every 6 (six) hours as needed. For pain   Yes Historical Provider, MD  b complex-vitamin c-folic acid (NEPHRO-VITE) 0.8 MG TABS Take 0.8 mg by mouth daily.   Yes Historical Provider, MD  clorazepate (TRANXENE) 3.75 MG tablet Take 3.75 mg by mouth daily as needed. For anxiety   Yes Historical Provider, MD  gabapentin (NEURONTIN) 100 MG capsule Take 200 mg by mouth at bedtime.   Yes Historical Provider, MD  hydrOXYzine (VISTARIL) 25 MG capsule Take 50 mg by mouth at bedtime.   Yes Historical Provider, MD  isosorbide mononitrate (IMDUR) 30 MG 24 hr tablet Take 30 mg by mouth daily.   Yes Historical Provider, MD  Ketotifen Fumarate (ALAWAY OP) Place 1 drop into both eyes 2 (two) times daily as needed. For allergies   Yes Historical Provider, MD  metoprolol tartrate (LOPRESSOR) 25 MG tablet Take 25 mg by  mouth 2 (two) times daily. Non dialysis days 2 tabs twice a day. tue thur sat are dialysis days 2 twice daily   Yes Historical Provider, MD  polyethylene glycol (MIRALAX / GLYCOLAX) packet Take 17 g by mouth daily as needed. For constipation   Yes Historical Provider, MD  sevelamer (RENAGEL) 800 MG tablet Take 800 mg by mouth 2 (two) times daily.   Yes Historical Provider, MD  nitroGLYCERIN (NITROSTAT) 0.4 MG SL tablet Place 0.4 mg under the tongue every 5 (five) minutes as needed. For chest pain    Historical Provider, MD    Current Facility-Administered Medications  Medication Dose Route Frequency Provider Last Rate Last Dose  . 0.9 %  sodium chloride infusion   Intravenous Continuous Lennette Bihari, MD 50 mL/hr at 08/12/12 1130    . acetaminophen (TYLENOL) tablet 650 mg  650 mg Oral Q4H PRN Lennette Bihari, MD      . ALPRAZolam Prudy Feeler) tablet 0.25 mg  0.25 mg Oral TID PRN Abelino Derrick, PA   0.25 mg at 08/12/12 1238  . aspirin chewable tablet 324 mg  324 mg Oral Pre-Cath Lennette Bihari, MD   324 mg at 08/12/12 [phone]  . diazepam (VALIUM) tablet 5 mg  5 mg Oral On Call Lennette Bihari, MD   5 mg at 08/12/12 [phone]  . fentaNYL (SUBLIMAZE) 0.05 MG/ML injection           . heparin 2-0.9 UNIT/ML-% infusion           . heparin ADULT infusion 100 units/mL (25000 units/250 mL)  650 Units/hr Intravenous Continuous Dannielle Karvonen Dulaney, PHARMD      . lidocaine (XYLOCAINE) 1 % injection           . midazolam (VERSED) 2 MG/2ML injection           . nitroGLYCERIN (NTG ON-CALL) 0.2 mg/mL injection           . ondansetron (ZOFRAN) injection 4 mg  4 mg Intravenous Q6H PRN Lennette Bihari, MD      . traMADol Janean Sark) tablet 50 mg  50 mg Oral Q6H PRN Abelino Derrick, PA      . zolpidem (AMBIEN) tablet 5 mg  5 mg Oral QHS PRN Abelino Derrick, PA       Labs: no other labs  CBG:  Lab 08/12/12 0731  GLUCAP 105*  Studies/Results: No results found.  ROS: As per HPI otherwise negative  Physical Exam: Filed Vitals:   08/12/12 0726 08/12/12 0854 08/12/12 1115  BP: 101/52  80/31  Pulse: 63 57 54  Temp: 97.4 F (36.3 C)  97.4 F (36.3 C)  TempSrc: Oral  Oral  Resp: 18  11  Height: 5\' 2"  (1.575 m)    Weight: 52.164 kg (115 lb)    SpO2: 98%  98%     General: Talkative, slender, in no acute distress eating lunch. Head: Normocephalic, atraumatic, sclera non-icteric, mucus membranes are moist Neck: Supple. JVD not elevated. Lungs: Clear bilaterally to auscultation without wheezes, rales, or rhonchi. Breathing is  unlabored. Heart: RRR with S1 S2.  Abdomen: Soft, non-tender, non-distended with normoactive bowel sounds. No rebound/guarding. No obvious abdominal masses. Skin:  Warm and dry; no rash. Lower extremities: puffiness of toes; no ischemic changes, no open wounds  Neuro: Alert and oriented X 3. Moves all extremities spontaneously. Psych:  Responds to questions appropriately with a normal affect. Dialysis Access:left upper AVGG + bruit and thrill  Dialysis Orders:  Center: [facility] on TTS .Optilflux 180 EDW 52HD Bath 2K 2.25 Ca   Time 3.5 Heparin 5000. Access left upper AVGG  BFR 400 DFR a 1.5   Zemplar 1 mcg IV/HD Epogen none  Venofer  100 q 2 weeks Other   Hgb 11.5 10/17, iPTH 163 10/17 ferritin 746 36 % sat 10/17  Assessment/Plan: 1.  Multi-vessel CAD s/p cath by Dr. Tresa Endo; for surgical consultation; if not a candidate will multivessel PCI will be considered; on heparin drip. Has been on BB and imdur. 2.  ESRD -  TTS AF - HD orders written for Thursday - tight heparin - check labs in am; had HD yesterday. 3.  Hypertension/volume  - metoprolol decreased yesterday to 25 mg 2 tab bid non HD days and 1 BID on HD days; BP quite low today - hold if low. CXR 10/18 NAD except bronchitic changes.  Pre HD wt 11/19 was 53.1.  Post wt was 52.3 with BP drop into the 90s from sitting BP 113/69 and standing BP 133/65 pre HD with pulse in 70s during treatment. 4.  Anemia  - Hgb has been stable in the 11s and not warranted ESA.  She has been on q 2 week IV Fe - will dose x 1; check CBC in am. 5.  Metabolic bone disease -  Well controlled on low dose zemplar and renvela 6. Peripheral neuropathy - continue neurontin 7. Hx bipolar d/o - on chronic tranxene 8. Other - RN to contact Cardiology PA for med rec.  Sheffield Slider, PA-C [facility] Beeper [phone] 08/12/2012, 2:14 PM   Patient seen and examined and agree with assessment and plan as above. 77 yo with hx of CP with exertion and at  times on dialysis, also with abnormal cardiac radionuclide study, was admitted for elective heart catheterization which was done this morning.  Study showed significant 3 vessel CAD- surgery has been consulted and IV heparin started. For dialysis tomorrow. Recommendations as above. Vinson Moselle  MD [employer] [phone] pgr    [phone] cell 08/12/2012, 5:11 PM

## 2012-08-12 NOTE — CV Procedure (Signed)
Cardiac Catheterization  Diane [name], 77 y.o., female  Full note dictated; see diagram in chart  DICTATION # [id number], [id number]  Ao: 80/44 LV: 80/9/15  LM: nl LAD: 30 - 40% ostial, 99% superior branch of Dx1 with Timi 2 flow, 90% LAD post Dx1 LCX; 80% OM1 RCA: 70 - 80% prox, 50% mid, 80% PDA at ostium  Mod LVH, EF 60%  Aortic arch performed, see dictation.  Will obtain surgical consult for possible CABG, if turned down then consider multivessel PCI.  Will heparinize for Timi2 flow with subtotal DX.  Lennette Bihari, MD, [facility] 08/12/2012 10:15 AM

## 2012-08-12 NOTE — H&P (Signed)
  Updated H&P:  See complete dictated note from 08/07/2012. Pt now presents for cath and possible PCI. No recurrent chest pain on increased medical therapy since office visit, but low BP with dialysis yesterday. Labs reviewed. Discussed procedure with patient and family. Plan this am. Trisa Cranor A 08/12/2012 8:36 AM

## 2012-08-12 NOTE — Progress Notes (Signed)
ANTICOAGULATION CONSULT NOTE - Initial Consult  Pharmacy Consult for Heparin Indication: Multivessel CAD awaiting TCTS consult  Allergies  Allergen Reactions  . Sulfa Antibiotics Other (See Comments)    Migraine   . Tape Rash    Can only use paper tape     Patient Measurements: Height: 5\' 2"  (157.5 cm) Weight: 115 lb (52.164 kg) IBW/kg (Calculated) : 50.1  Heparin Dosing Weight: 52.2kg  Vital Signs: Temp: 97.4 F (36.3 C) (10/23 1115) Temp src: Oral (10/23 1115) BP: 80/31 mmHg (10/23 1115) Pulse Rate: 54  (10/23 1115)  Labs: No results found for this basename: HGB:2,HCT:3,PLT:3,APTT:3,LABPROT:3,INR:3,HEPARINUNFRC:3,CREATININE:3,CKTOTAL:3,CKMB:3,TROPONINI:3 in the last 72 hours  CrCl is unknown because no creatinine reading has been taken.   Medical History: No past medical history on file.  Medications:  Prescriptions prior to admission  Medication Sig Dispense Refill  . acetaminophen (TYLENOL) 500 MG tablet Take 1,000 mg by mouth every 6 (six) hours as needed. For pain      . b complex-vitamin c-folic acid (NEPHRO-VITE) 0.8 MG TABS Take 0.8 mg by mouth daily.      . clorazepate (TRANXENE) 3.75 MG tablet Take 3.75 mg by mouth daily as needed. For anxiety      . gabapentin (NEURONTIN) 100 MG capsule Take 200 mg by mouth at bedtime.      . hydrOXYzine (VISTARIL) 25 MG capsule Take 50 mg by mouth at bedtime.      . isosorbide mononitrate (IMDUR) 30 MG 24 hr tablet Take 30 mg by mouth daily.      Marland Kitchen Ketotifen Fumarate (ALAWAY OP) Place 1 drop into both eyes 2 (two) times daily as needed. For allergies      . metoprolol tartrate (LOPRESSOR) 25 MG tablet Take 25 mg by mouth 2 (two) times daily. Non dialysis days 2 tabs twice a day. tue thur sat are dialysis days 2 twice daily      . polyethylene glycol (MIRALAX / GLYCOLAX) packet Take 17 g by mouth daily as needed. For constipation      . sevelamer (RENAGEL) 800 MG tablet Take 800 mg by mouth 2 (two) times daily.      .  nitroGLYCERIN (NITROSTAT) 0.4 MG SL tablet Place 0.4 mg under the tongue every 5 (five) minutes as needed. For chest pain        Assessment: 77yof s/p cath to start heparin for multivessel CAD while awaiting TCTS consult for possible CABG. Per MD orders, start heparin 6 hours post sheath pull - sheath pulled ~ 1040. Patient reports no bleeding and not on any anticoagulants. - Baseline labs ordered - follow-up - Heparin weight: 52.2kg  Goal of Therapy:  Heparin level 0.3-0.7 units/ml Monitor platelets by anticoagulation protocol: Yes   Plan:  1. Start heparin drip 650 units/hr (6.5 ml/hr) @ 1645 tonight 2. Heparin level 8 hours after heparin initiation 3. Daily heparin level and CBC 4. Follow-up surgical vs PCI plans 5. CBC, INR, APTT now  Cleon Dew [phone] 08/12/2012,12:07 PM

## 2012-08-13 ENCOUNTER — Encounter (HOSPITAL_COMMUNITY): Payer: Medicare Other

## 2012-08-13 ENCOUNTER — Encounter (HOSPITAL_COMMUNITY): Payer: Self-pay

## 2012-08-13 ENCOUNTER — Inpatient Hospital Stay (HOSPITAL_COMMUNITY): Payer: Medicare Other

## 2012-08-13 DIAGNOSIS — Z01810 Encounter for preprocedural cardiovascular examination: Secondary | ICD-10-CM

## 2012-08-13 DIAGNOSIS — I2510 Atherosclerotic heart disease of native coronary artery without angina pectoris: Secondary | ICD-10-CM

## 2012-08-13 DIAGNOSIS — Z992 Dependence on renal dialysis: Secondary | ICD-10-CM | POA: Diagnosis present

## 2012-08-13 DIAGNOSIS — M25569 Pain in unspecified knee: Secondary | ICD-10-CM | POA: Diagnosis present

## 2012-08-13 DIAGNOSIS — N186 End stage renal disease: Secondary | ICD-10-CM | POA: Diagnosis present

## 2012-08-13 LAB — RENAL FUNCTION PANEL
Albumin: 3.6 g/dL (ref 3.5–5.2)
BUN: 47 mg/dL — ABNORMAL HIGH (ref 6–23)
CO2: 20 mEq/L (ref 19–32)
Calcium: 9.4 mg/dL (ref 8.4–10.5)
Chloride: 109 mEq/L (ref 96–112)
Creatinine, Ser: 4.23 mg/dL — ABNORMAL HIGH (ref 0.50–1.10)
GFR calc Af Amer: 11 mL/min — ABNORMAL LOW (ref >90)
GFR calc non Af Amer: 9 mL/min — ABNORMAL LOW (ref >90)
Glucose, Bld: 134 mg/dL — ABNORMAL HIGH (ref 70–99)
Phosphorus: 4.1 mg/dL (ref 2.3–4.6)
Potassium: 4.7 mEq/L (ref 3.5–5.1)
Sodium: 142 mEq/L (ref 135–145)

## 2012-08-13 LAB — CBC
HCT: 32.7 % — ABNORMAL LOW (ref 36.0–46.0)
MCHC: 33.3 g/dL (ref 30.0–36.0)
Platelets: 145 10*3/uL — ABNORMAL LOW (ref 150–400)
RDW: 13.8 % (ref 11.5–15.5)
WBC: 7.7 10*3/uL (ref 4.0–10.5)

## 2012-08-13 LAB — GLUCOSE, CAPILLARY: Glucose-Capillary: 127 mg/dL — ABNORMAL HIGH (ref 70–99)

## 2012-08-13 LAB — HEPARIN LEVEL (UNFRACTIONATED)
Heparin Unfractionated: 0.24 IU/mL — ABNORMAL LOW (ref 0.30–0.70)
Heparin Unfractionated: >2 IU/mL — ABNORMAL HIGH (ref 0.30–0.70)

## 2012-08-13 LAB — SURGICAL PCR SCREEN: Staphylococcus aureus: NEGATIVE

## 2012-08-13 LAB — HEPATITIS B SURFACE ANTIGEN: Hepatitis B Surface Ag: NEGATIVE

## 2012-08-13 MED ORDER — DARBEPOETIN ALFA-POLYSORBATE 60 MCG/0.3ML IJ SOLN
60.0000 ug | INTRAMUSCULAR | Status: DC
  Administered 2012-08-13 – 2012-08-20 (×2): 60 ug via INTRAVENOUS
  Filled 2012-08-13 (×2): qty 0.3

## 2012-08-13 MED ORDER — DIAZEPAM 5 MG PO TABS
5.0000 mg | ORAL_TABLET | Freq: Once | ORAL | Status: AC
  Administered 2012-08-14: 5 mg via ORAL
  Filled 2012-08-13: qty 1

## 2012-08-13 MED ORDER — DARBEPOETIN ALFA-POLYSORBATE 60 MCG/0.3ML IJ SOLN
INTRAMUSCULAR | Status: AC
  Filled 2012-08-13: qty 0.3

## 2012-08-13 MED ORDER — EPINEPHRINE HCL 1 MG/ML IJ SOLN
0.5000 ug/min | INTRAVENOUS | Status: DC
  Filled 2012-08-13: qty 4

## 2012-08-13 MED ORDER — SODIUM CHLORIDE 0.9 % IV SOLN
INTRAVENOUS | Status: DC
  Administered 2012-08-14 (×3): via INTRAVENOUS

## 2012-08-13 MED ORDER — DEXMEDETOMIDINE HCL IN NACL 400 MCG/100ML IV SOLN
0.1000 ug/kg/h | INTRAVENOUS | Status: AC
  Administered 2012-08-14: 0.2 ug/kg/h via INTRAVENOUS
  Filled 2012-08-13: qty 100

## 2012-08-13 MED ORDER — PLASMA-LYTE 148 IV SOLN
INTRAVENOUS | Status: AC
  Administered 2012-08-14: 09:00:00
  Filled 2012-08-13 (×2): qty 2.5

## 2012-08-13 MED ORDER — METOPROLOL TARTRATE 12.5 MG HALF TABLET
12.5000 mg | ORAL_TABLET | Freq: Once | ORAL | Status: DC
  Filled 2012-08-13: qty 1

## 2012-08-13 MED ORDER — NITROGLYCERIN IN D5W 200-5 MCG/ML-% IV SOLN
2.0000 ug/min | INTRAVENOUS | Status: AC
  Administered 2012-08-14: 16.66 ug/min via INTRAVENOUS
  Filled 2012-08-13: qty 250

## 2012-08-13 MED ORDER — METOPROLOL TARTRATE 12.5 MG HALF TABLET
12.5000 mg | ORAL_TABLET | Freq: Once | ORAL | Status: AC
  Administered 2012-08-14: 12.5 mg via ORAL
  Filled 2012-08-13: qty 1

## 2012-08-13 MED ORDER — PHENYLEPHRINE HCL 10 MG/ML IJ SOLN
30.0000 ug/min | INTRAVENOUS | Status: AC
  Administered 2012-08-14: 25 ug/min via INTRAVENOUS
  Filled 2012-08-13: qty 2

## 2012-08-13 MED ORDER — CHLORHEXIDINE GLUCONATE 4 % EX LIQD: 60.0000 mL | Freq: Once | CUTANEOUS | Status: DC

## 2012-08-13 MED ORDER — SODIUM CHLORIDE 0.9 % IV SOLN
INTRAVENOUS | Status: AC
  Administered 2012-08-14: 69.8 mL/h via INTRAVENOUS
  Filled 2012-08-13 (×2): qty 40

## 2012-08-13 MED ORDER — PARICALCITOL 5 MCG/ML IV SOLN
INTRAVENOUS | Status: AC
  Administered 2012-08-13: 10:00:00 1 ug via INTRAVENOUS
  Filled 2012-08-13: qty 1

## 2012-08-13 MED ORDER — POTASSIUM CHLORIDE 2 MEQ/ML IV SOLN
80.0000 meq | INTRAVENOUS | Status: DC
  Filled 2012-08-13: qty 40

## 2012-08-13 MED ORDER — MAGNESIUM SULFATE 50 % IJ SOLN
40.0000 meq | INTRAMUSCULAR | Status: DC
  Filled 2012-08-13: qty 10

## 2012-08-13 MED ORDER — DOPAMINE-DEXTROSE 3.2-5 MG/ML-% IV SOLN
2.0000 ug/kg/min | INTRAVENOUS | Status: DC
  Filled 2012-08-13: qty 250

## 2012-08-13 MED ORDER — DIAZEPAM 5 MG PO TABS: 5.0000 mg | ORAL_TABLET | Freq: Once | ORAL | Status: DC

## 2012-08-13 MED ORDER — CEFUROXIME SODIUM 1.5 G IJ SOLR
1.5000 g | INTRAMUSCULAR | Status: AC
  Administered 2012-08-14: .75 g via INTRAVENOUS
  Administered 2012-08-14: 1.5 g via INTRAVENOUS
  Filled 2012-08-13: qty 1.5

## 2012-08-13 MED ORDER — DEXTROSE 5 % IV SOLN
750.0000 mg | INTRAVENOUS | Status: DC
  Filled 2012-08-13: qty 750

## 2012-08-13 MED ORDER — BISACODYL 5 MG PO TBEC: 5.0000 mg | DELAYED_RELEASE_TABLET | Freq: Once | ORAL | Status: DC

## 2012-08-13 MED ORDER — CLORAZEPATE DIPOTASSIUM 3.75 MG PO TABS
3.7500 mg | ORAL_TABLET | Freq: Every day | ORAL | Status: DC
  Administered 2012-08-13 – 2012-08-20 (×6): 3.75 mg via ORAL
  Filled 2012-08-13 (×6): qty 1

## 2012-08-13 MED ORDER — SODIUM CHLORIDE 0.9 % IV SOLN
INTRAVENOUS | Status: AC
  Administered 2012-08-14: 2 [IU]/h via INTRAVENOUS
  Filled 2012-08-13 (×2): qty 1

## 2012-08-13 MED ORDER — BISACODYL 5 MG PO TBEC
5.0000 mg | DELAYED_RELEASE_TABLET | Freq: Once | ORAL | Status: AC
  Administered 2012-08-13: 5 mg via ORAL
  Filled 2012-08-13: qty 1

## 2012-08-13 MED ORDER — CHLORHEXIDINE GLUCONATE 4 % EX LIQD
60.0000 mL | Freq: Once | CUTANEOUS | Status: DC
  Filled 2012-08-13: qty 60

## 2012-08-13 MED ORDER — VANCOMYCIN HCL 1000 MG IV SOLR
1000.0000 mg | INTRAVENOUS | Status: AC
  Administered 2012-08-14: 1000 mg via INTRAVENOUS
  Filled 2012-08-13: qty 1000

## 2012-08-13 NOTE — Progress Notes (Signed)
Received patient from PFT department with Rapid Response nurse with patient.  Patient apparently went unresponsive in Respiratory dept. While waiting to do PFT's.  Patient currently awake, alert and oriented.  Very tearful at this time and anxious.  Dr. Rennis Golden made aware of patient in unit as well as Dr. Laneta Simmers and Dr. Lowell Guitar.  Orders received for patient to be in Stepdown unit per Dr. Rennis Golden.  Trnasfer placed in computer.  There currently are no SDU beds, so patient will remain on 2300 at this time.

## 2012-08-13 NOTE — Consult Note (Signed)
[address]            Diane [name] [address]          [phone]      Reason for Consult: Severe multi-vessel coronary disease Referring Physician:  Nicki Guadalajara, MD  Diane [name] is an 77 y.o. female.  HPI:  The patient is a 77 year old woman with end-stage renal disease due to interstitial nephritis apparently felt to be due to lithium toxicity used to treat her bipolar disorder. She has been on hemodialysis since February of 2013. She recently began having substernal chest pressure and hypotension on hemodialysis. She said she has also had some episodes of chest heaviness at home while working around her house. She was started on Imdur and has not had any chest discomfort since then. She had an abnormal nuclear perfusion scan on 08/05/2012 and therefore underwent cardiac catheterization showing severe multivessel coronary disease as mentioned below with a left ventricular ejection fraction of 60%.   Past Medical History  Diagnosis Date  . Anemia in chronic kidney disease(285.21)   . Bipolar disorder, unspecified   . Iron deficiency anemia, unspecified   . Secondary hyperparathyroidism (of renal origin)   . Restless legs syndrome (RLS)   . Unspecified hypertensive kidney disease with chronic kidney disease stage V or end stage renal disease   . 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 Personal history of colonic polyps   . Diverticulosis of colon (without mention of hemorrhage) 2006    Colonoscopy Dr. Randa Evens  . Coronary artery disease   . Anginal pain     Past Surgical History  Procedure Date  . Arteriovenous graft placement 11/2010    left upper - AVGG Dr. Arbie Cookey  . Arteriovenous graft placement 12/2009    left lower - AVGG Dr. Arbie Cookey  . Av fistula placement 07/2009    right upper AVF Dr. Arbie Cookey  . Av fistula placement 01/2002    right lower Dr. Arbie Cookey  . Cardiac catheterization  08/12/2012  . Abdominal hysterectomy     History reviewed. No pertinent family history.  Social History:  reports that she has never smoked. She has never used smokeless tobacco. She reports that she does not drink alcohol or use illicit drugs.  Allergies:  Allergies  Allergen Reactions  . Sulfa Antibiotics Other (See Comments)    Migraine   . Tape Rash    Can only use paper tape     Medications:  I have reviewed the patient's current medications. Prior to Admission:  Prescriptions prior to admission  Medication Sig Dispense Refill  . acetaminophen (TYLENOL) 500 MG tablet Take 1,000 mg by mouth every 6 (six) hours as needed. For pain      . b complex-vitamin c-folic acid (NEPHRO-VITE) 0.8 MG TABS Take 0.8 mg by mouth daily.      . clorazepate (TRANXENE) 3.75 MG tablet Take 3.75 mg by mouth daily as needed. For anxiety      . gabapentin (NEURONTIN) 100 MG capsule Take 200 mg by mouth at bedtime.      . hydrOXYzine (VISTARIL) 25 MG capsule Take 50 mg by mouth at bedtime.      . isosorbide mononitrate (IMDUR) 30 MG 24 hr tablet Take 30 mg by mouth daily.      Marland Kitchen Ketotifen Fumarate (ALAWAY OP) Place  1 drop into both eyes 2 (two) times daily as needed. For allergies      . metoprolol tartrate (LOPRESSOR) 25 MG tablet Take 25 mg by mouth 2 (two) times daily. Non dialysis days 2 tabs twice a day. tue thur sat are dialysis days 2 twice daily      . polyethylene glycol (MIRALAX / GLYCOLAX) packet Take 17 g by mouth daily as needed. For constipation      . sevelamer (RENAGEL) 800 MG tablet Take 800 mg by mouth 2 (two) times daily.      . nitroGLYCERIN (NITROSTAT) 0.4 MG SL tablet Place 0.4 mg under the tongue every 5 (five) minutes as needed. For chest pain       Scheduled:   . clorazepate  3.75 mg Oral QHS  . darbepoetin      . darbepoetin (ARANESP) injection - DIALYSIS  60 mcg Intravenous Q Thu-HD  . ferric gluconate (FERRLECIT/NULECIT) IV  125 mg Intravenous Once in dialysis  .  gabapentin  200 mg Oral QHS  . going for heart surgery book   Does not apply Once  . hydrOXYzine  50 mg Oral QHS  . isosorbide mononitrate  30 mg Oral Daily  . metoprolol tartrate  25 mg Oral BID  . multivitamin  1 tablet Oral QHS  . paricalcitol  1 mcg Intravenous Q T,Th,Sa-HD  . sevelamer  800 mg Oral Q breakfast  . DISCONTD: ketotifen  1 drop Both Eyes BID   Continuous:   . sodium chloride 50 mL/hr at 08/12/12 1130  . heparin 800 Units/hr (08/13/12 0333)   JYN:WGNFAO chloride, sodium chloride, acetaminophen, acetaminophen, ALPRAZolam, alteplase, calcium carbonate (dosed in mg elemental calcium), camphor-menthol, docusate sodium, heparin, hydrOXYzine, lidocaine, lidocaine-prilocaine, nitroGLYCERIN, olopatadine, ondansetron (ZOFRAN) IV, ondansetron, pentafluoroprop-tetrafluoroeth, polyethylene glycol, sorbitol, traMADol, zolpidem, DISCONTD: acetaminophen, DISCONTD: heparin DISCONTD: ondansetron (ZOFRAN) IV, DISCONTD: zolpidem  Results for orders placed during the hospital encounter of 08/12/12 (from the past 48 hour(s))  GLUCOSE, CAPILLARY     Status: Abnormal   Collection Time   08/12/12  7:31 AM      Component Value Range Comment   Glucose-Capillary 105 (*) 70 - 99 mg/dL   CBC     Status: Abnormal   Collection Time   08/12/12  2:13 PM      Component Value Range Comment   WBC 6.2  4.0 - 10.5 [name]/uL    RBC 4.15  3.87 - 5.11 MIL/uL    Hemoglobin 12.5  12.0 - 15.0 g/dL    HCT 13.0  86.5 - 78.4 %    MCV 90.8  78.0 - 100.0 fL    [name] 30.1  26.0 - 34.0 pg    MCHC 33.2  30.0 - 36.0 g/dL    RDW 69.6  29.5 - 28.4 %    Platelets 141 (*) 150 - 400 [name]/uL   PROTIME-INR     Status: Normal   Collection Time   08/12/12  2:13 PM      Component Value Range Comment   Prothrombin Time 13.2  11.6 - 15.2 seconds    INR 1.01  0.00 - 1.49   APTT     Status: Normal   Collection Time   08/12/12  2:13 PM      Component Value Range Comment   aPTT 29  24 - 37 seconds   HEPARIN LEVEL (UNFRACTIONATED)      Status: Abnormal   Collection Time   08/13/12  1:18 AM  Component Value Range Comment   Heparin Unfractionated 0.24 (*) 0.30 - 0.70 IU/mL   CBC     Status: Abnormal   Collection Time   08/13/12  1:18 AM      Component Value Range Comment   WBC 7.7  4.0 - 10.5 [name]/uL    RBC 3.62 (*) 3.87 - 5.11 MIL/uL    Hemoglobin 10.9 (*) 12.0 - 15.0 g/dL    HCT 78.2 (*) 95.6 - 46.0 %    MCV 90.3  78.0 - 100.0 fL    [name] 30.1  26.0 - 34.0 pg    MCHC 33.3  30.0 - 36.0 g/dL    RDW 21.3  08.6 - 57.8 %    Platelets 145 (*) 150 - 400 [name]/uL   RENAL FUNCTION PANEL     Status: Abnormal   Collection Time   08/13/12  7:07 AM      Component Value Range Comment   Sodium 142  135 - 145 mEq/L    Potassium 4.7  3.5 - 5.1 mEq/L    Chloride 109  96 - 112 mEq/L    CO2 20  19 - 32 mEq/L    Glucose, Bld 134 (*) 70 - 99 mg/dL    BUN 47 (*) 6 - 23 mg/dL    Creatinine, Ser 4.69 (*) 0.50 - 1.10 mg/dL    Calcium 9.4  8.4 - 62.9 mg/dL    Phosphorus 4.1  2.3 - 4.6 mg/dL    Albumin 3.6  3.5 - 5.2 g/dL    GFR calc non Af Amer 9 (*) >90 mL/min    GFR calc Af Amer 11 (*) >90 mL/min     No results found.  Review of Systems  Constitutional: Positive for malaise/fatigue. Negative for fever, chills, weight loss and diaphoresis.  Eyes: Negative.   Respiratory: Positive for shortness of breath.   Cardiovascular: Positive for chest pain, palpitations and orthopnea. Negative for claudication, leg swelling and PND.  Gastrointestinal: Negative.   Genitourinary: Negative.   Musculoskeletal: Positive for joint pain.       Right knee pain after hitting it recently.  Skin: Negative.   Neurological: Positive for tingling. Negative for dizziness, focal weakness, seizures, loss of consciousness and weakness.       She has what sounds like peripheral neuropathy in legs and feet with burning pain and tingling.  Endo/Heme/Allergies: Negative.   Psychiatric/Behavioral:       Bipolar disorder   Blood pressure 106/54, pulse 80,  temperature 97.7 F (36.5 C), temperature source Oral, resp. rate 12, height 5\' 2"  (1.575 m), weight 51.1 kg (112 lb 10.5 oz), SpO2 96.00%. Physical Exam  Constitutional: She is oriented to person, place, and time. She appears well-developed and well-nourished. No distress.  HENT:  Head: Normocephalic and atraumatic.  Mouth/Throat: Oropharynx is clear and moist. No oropharyngeal exudate.  Eyes: Conjunctivae normal and EOM are normal. Pupils are equal, round, and reactive to light.  Neck: Normal range of motion. Neck supple. No JVD present. No tracheal deviation present. No thyromegaly present.  Cardiovascular: Normal rate, regular rhythm and intact distal pulses.  Exam reveals gallop. Exam reveals no friction rub.   No murmur heard. Respiratory: Effort normal and breath sounds normal. No respiratory distress.  GI: Soft. Bowel sounds are normal. She exhibits no mass. There is no tenderness.  Musculoskeletal: Normal range of motion. She exhibits no edema and no tenderness.  Lymphadenopathy:    She has no cervical adenopathy.  Neurological: She is alert  and oriented to person, place, and time. She has normal strength. No cranial nerve deficit or sensory deficit.  Skin: Skin is warm and dry.  Psychiatric:       Anxious and emotional. Can't make a decision.   CARDIAC CATH:  Ao: 80/44 LV: 80/9/15  LM: nl LAD: 30 - 40% ostial, 99% superior branch of Dx1 with Timi 2 flow, 90% LAD post Dx1 LCX; 80% OM1 RCA: 70 - 80% prox, 50% mid, 80% PDA at ostium  Mod LVH, EF 60%  Aortic arch performed, see dictation.  Assessment/Plan:  She has severe multivessel coronary disease with anginal symptoms occurring with hemodialysis as well as with exertion. I think coronary bypass graft surgery is the best treatment to prevent further ischemia and infarction and improve her quality of life. I spent over one hour discussing the surgery with the patient and her husband including alternatives, benefits, and  risks. She has a hard time focusing and was very emotional and could not make a decision about surgery. I told her that I could do her surgery on Friday following dialysis on Thursday. She will think about that overnight and will let me know on Thursday.  Diane [name] 08/13/2012, 12:03 PM

## 2012-08-13 NOTE — Progress Notes (Addendum)
ANTICOAGULATION CONSULT NOTE - Follow Up Consult  Pharmacy Consult for Heparin Indication: Multivessel CAD awaiting TCTS consult  Allergies  Allergen Reactions  . Sulfa Antibiotics Other (See Comments)    Migraine   . Tape Rash    Can only use paper tape     Patient Measurements: Height: 5\' 2"  (157.5 cm) Weight: 112 lb 10.5 oz (51.1 kg) IBW/kg (Calculated) : 50.1  Heparin Dosing Weight: 52.2 kg  Vital Signs: Temp: 98.7 F (37.1 C) (10/24 1413) Temp src: Oral (10/24 1413) BP: 100/51 mmHg (10/24 1500) Pulse Rate: 87  (10/24 1500)  Labs:  Basename 08/13/12 1519 08/13/12 1246 08/13/12 0707 08/13/12 0118 08/12/12 1413  HGB -- -- -- 10.9* 12.5  HCT -- -- -- 32.7* 37.7  PLT -- -- -- 145* 141*  APTT -- -- -- -- 29  LABPROT -- -- -- -- 13.2  INR -- -- -- -- 1.01  HEPARINUNFRC 0.32 >2.00* -- 0.24* --  CREATININE -- -- 4.23* -- --  CKTOTAL -- -- -- -- --  CKMB -- -- -- -- --  TROPONINI -- -- -- -- --    Estimated Creatinine Clearance: 9.1 ml/min (by C-G formula based on Cr of 4.23).   Medications:  Infusions:    . sodium chloride 10 mL/hr (08/13/12 1545)  . heparin 800 Units/hr (08/13/12 1500)  . DISCONTD: sodium chloride 50 mL/hr at 08/12/12 1130   Assessment: Diane [name] on IV heparin awaiting CABG in AM. Heparin level upon repeat is therapeutic (unsure if prior level may have been skewed by hemodialysis and change in distribution level) on current rate of 800 units/hr.   Goal of Therapy:  Heparin level 0.3-0.7 units/ml Monitor platelets by anticoagulation protocol: Yes   Plan:  1. Continue heparin at 800 units/hr.  2. Follow up after CABG 10.25.13  Okey Regal, PharmD [phone]

## 2012-08-13 NOTE — Progress Notes (Signed)
Responded to code blue in the pulmonary lab.  Apparently in preparation for PFT's, Mrs. Diane [name] slumped over in the chair and was minimally responsive. She did have a pulse but a low blood pressure. She was transported over to 3300. Blood sugar was adequate. She seemed to recover with oxygen and trendelenburg positioning. Hopefully we can re-attempt PFT's later today. Plan for OR tomorrow may be delayed. I would favor her being on 2300 pending surgery.  Chrystie Nose, MD, [facility] Attending Cardiologist [facility]

## 2012-08-13 NOTE — Progress Notes (Signed)
1 Day Post-Op Procedure(s) (LRB): LEFT AND RIGHT HEART CATHETERIZATION WITH CORONARY/GRAFT ANGIOGRAM () Subjective: Had HD this am and still a little tired.  Objective: Vital signs in last 24 hours: Temp:  [97.2 F (36.2 C)-98.3 F (36.8 C)] 97.7 F (36.5 C) (10/24 1125) Pulse Rate:  [74-88] 80  (10/24 1125) Cardiac Rhythm:  [-] Normal sinus rhythm (10/24 1034) Resp:  [12-23] 12  (10/24 1125) BP: (84-149)/(40-88) 106/54 mmHg (10/24 1125) SpO2:  [94 %-99 %] 96 % (10/24 1125) Weight:  [51.1 kg (112 lb 10.5 oz)-52.1 kg (114 lb 13.8 oz)] 51.1 kg (112 lb 10.5 oz) (10/24 1033)  Hemodynamic parameters for last 24 hours:    Intake/Output from previous day: 10/23 0701 - 10/24 0700 In: 491.7 [P.O.:120; I.V.:371.7] Out: 1300 [Urine:1300] Intake/Output this shift: Total I/O In: -  Out: 701 [Urine:700; Stool:1]  General appearance: alert and cooperative Heart: regular rate and rhythm, S1, S2 normal, no murmur, click, rub or gallop Lungs: clear to auscultation bilaterally  Lab Results:  Basename 08/13/12 0118 08/12/12 1413  WBC 7.7 6.2  HGB 10.9* 12.5  HCT 32.7* 37.7  PLT 145* 141*   BMET:  Basename 08/13/12 0707  NA 142  K 4.7  CL 109  CO2 20  GLUCOSE 134*  BUN 47*  CREATININE 4.23*  CALCIUM 9.4    PT/INR:  Basename 08/12/12 1413  LABPROT 13.2  INR 1.01   ABG No results found for this basename: phart, pco2, po2, hco3, tco2, acidbasedef, o2sat   CBG (last 3)   Basename 08/12/12 0731  GLUCAP 105*    Assessment/Plan: S/P Procedure(s) (LRB): LEFT AND RIGHT HEART CATHETERIZATION WITH CORONARY/GRAFT ANGIOGRAM () She has decided to proceed with CABG tomorrow. I discussed the operative procedure with the patient and family including alternatives, benefits and risks; including but not limited to bleeding, blood transfusion, infection, stroke, myocardial infarction, graft failure, heart block requiring a permanent pacemaker, organ dysfunction, and death.  Cleda Daub understands and agrees to proceed.  We will schedule surgery for tomorrow.  [name] 08/13/2012

## 2012-08-13 NOTE — Progress Notes (Signed)
Subjective:  On hd, no cos/Considering CABG surgery=" I think I will have it" Objective Vital signs in last 24 hours: Filed Vitals:   08/13/12 0830 08/13/12 0900 08/13/12 0914 08/13/12 0916  BP: 104/51 93/50 99/51  104/52  Pulse: 75 82    Temp:      TempSrc:      Resp:      Height:      Weight:      SpO2:       Weight change:   Intake/Output Summary (Last 24 hours) at 08/13/12 0924 Last data filed at 08/13/12 0606  Gross per 24 hour  Intake 491.67 ml  Output   1300 ml  Net -808.33 ml   Labs: Basic Metabolic Panel:  Lab 08/13/12 1610  NA 142  K 4.7  CL 109  CO2 20  GLUCOSE 134*  BUN 47*  CREATININE 4.23*  CALCIUM 9.4  ALB --  PHOS 4.1   Liver Function Tests:  Lab 08/13/12 0707  AST --  ALT --  ALKPHOS --  BILITOT --  PROT --  ALBUMIN 3.6   No results found for this basename: LIPASE:3,AMYLASE:3 in the last 168 hours No results found for this basename: AMMONIA:3 in the last 168 hours CBC:  Lab 08/13/12 0118 08/12/12 1413  WBC 7.7 6.2  NEUTROABS -- --  HGB 10.9* 12.5  HCT 32.7* 37.7  MCV 90.3 90.8  PLT 145* 141*   Cardiac Enzymes: No results found for this basename: CKTOTAL:5,CKMB:5,CKMBINDEX:5,TROPONINI:5 in the last 168 hours CBG:  Lab 08/12/12 0731  GLUCAP 105*    Iron Studies: No results found for this basename: IRON,TIBC,TRANSFERRIN,FERRITIN in the last 72 hours Studies/Results: No results found. Medications:    . sodium chloride 50 mL/hr at 08/12/12 1130  . heparin 800 Units/hr (08/13/12 0333)  . DISCONTD: sodium chloride    . DISCONTD: dextrose 5 % and 0.45% NaCl 50 mL/hr at 08/12/12 0839      . darbepoetin (ARANESP) injection - DIALYSIS  60 mcg Intravenous Q Thu-HD  . ferric gluconate (FERRLECIT/NULECIT) IV  125 mg Intravenous Once in dialysis  . gabapentin  200 mg Oral QHS  . going for heart surgery book   Does not apply Once  . hydrOXYzine  50 mg Oral QHS  . isosorbide mononitrate  30 mg Oral Daily  . metoprolol tartrate  25  mg Oral BID  . multivitamin  1 tablet Oral QHS  . paricalcitol      . paricalcitol  1 mcg Intravenous Q T,Th,Sa-HD  . sevelamer  800 mg Oral Q breakfast  . DISCONTD: ketotifen  1 drop Both Eyes BID  . DISCONTD: sodium chloride  3 mL Intravenous Q12H   I  have reviewed scheduled and prn medications.  Physical Exam: General: Alert, , NAD on hd Heart: RRR, no rub, mo Murmur Lungs: CTA bilaterally Abdomen: soft, nontender Extremities: Dialysis Access: no pedal edema , L U A AVGG patent on hd   Dialysis Orders: Center: [facility] on TTS .Optilflux 180  EDW 52HD Bath 2K 2.25 Ca Time 3.5 Heparin 5000. Access left upper AVGG BFR 400 DFR a 1.5  Zemplar 1 mcg IV/HD Epogen none Venofer 100 q 2 weeks  Other Hgb 11.5 10/17, iPTH 163 10/17 ferritin 746 36 % sat 10/17   Assessment/Plan:  1. Multi-vessel CAD s/p cath by Dr. Tresa Endo; Dr. Laneta Simmers recommending CABG / Pt. Considering it , On heparin.Imdur 30 mg, Metoprolol 25 mg bid 2. ESRD - TTS AF -on  HD now-  tight heparin -4.7 k  3. Hypertension/volume - She is below her outpt. edw 52kg  This am 51.1 at 6;38 am with nad on cxr /  on hd this am changed to no uf /now on  metoprolol  25  tab bid / BP drop into the 80 s on hd  4. Anemia - Hgb  10.9 / with CAD start low dose Aranesp / She has been on q 2 week IV Fe - will dose x 1; check CBC in am. 5. Metabolic bone disease CA 9.4 and Phos 3.6 /on zemplar 1 mcg  And as outpt. renvela 800mg  breakfast and supper  , in hosp. continue  Only with breakfast  6. Peripheral neuropathy - continue neurontin 200mg  hs 7. Hx bipolar d/o - ON CHRONIC TRANXENE 3.75mg  "every night  And Sometimes Only  When I need to take extra tab in day if I need it  For Nervousness."    Lenny Pastel, PA-C [facility] Beeper [phone] 08/13/2012,9:24 AM  LOS: 1 day   Patient seen and examined and agree with assessment and plan as above.  Vinson Moselle  MD [facility] [phone] pgr    [phone]  cell 08/13/2012, 2:37 PM

## 2012-08-13 NOTE — Progress Notes (Signed)
Pt. Seen and examined. Agree with the NP/PA-C note as written.  Plan CABG per Dr. Laneta Simmers tomorrow. Appreciate TCTS assistance.   Chrystie Nose, MD, [facility] Attending Cardiologist [facility]

## 2012-08-13 NOTE — Progress Notes (Signed)
ANTICOAGULATION CONSULT NOTE  Pharmacy Consult for Heparin Indication: Multivessel CAD awaiting TCTS consult  Allergies  Allergen Reactions  . Sulfa Antibiotics Other (See Comments)    Migraine   . Tape Rash    Can only use paper tape     Patient Measurements: Height: 5\' 2"  (157.5 cm) Weight: 114 lb 13.8 oz (52.1 kg) IBW/kg (Calculated) : 50.1  Heparin Dosing Weight: 52.2kg  Vital Signs: Temp: 98.3 F (36.8 C) (10/24 0015) Temp src: Oral (10/24 0015) BP: 96/55 mmHg (10/24 0015) Pulse Rate: 80  (10/24 0015)  Labs:  Basename 08/13/12 0118 08/12/12 1413  HGB 10.9* 12.5  HCT 32.7* 37.7  PLT 145* 141*  APTT -- 29  LABPROT -- 13.2  INR -- 1.01  HEPARINUNFRC 0.24* --  CREATININE -- --  CKTOTAL -- --  CKMB -- --  TROPONINI -- --    CrCl is unknown because no creatinine reading has been taken.  Assessment: 77 yo female with CAD, awaiting possible CABG, for Heparin.  Goal of Therapy:  Heparin level 0.3-0.7 units/ml Monitor platelets by anticoagulation protocol: Yes   Plan:  Increase Heparin 800 units/hr Check heparin level in 8 hours.  Katyana Trolinger, Gary Fleet 08/13/2012,3:31 AM

## 2012-08-13 NOTE — Progress Notes (Signed)
[facility]  Subjective: No CP.  She states that she feel about a week and a half ago and injured her right knee.   She has been walking on it but does have some pain with bending.  Objective: Vital signs in last 24 hours: Temp:  [97.3 F (36.3 C)-98.3 F (36.8 C)] 97.3 F (36.3 C) (10/24 1610) Pulse Rate:  [54-88] 82  (10/24 0900) Resp:  [11-23] 16  (10/24 0730) BP: (80-149)/(31-88) 104/52 mmHg (10/24 0916) SpO2:  [94 %-99 %] 99 % (10/24 9604) Weight:  [51.1 kg (112 lb 10.5 oz)-52.1 kg (114 lb 13.8 oz)] 51.1 kg (112 lb 10.5 oz) (10/24 5409) Last BM Date: 08/11/12  Intake/Output from previous day: 10/23 0701 - 10/24 0700 In: 491.7 [P.O.:120; I.V.:371.7] Out: 1300 [Urine:1300] Intake/Output this shift:    Medications Current Facility-Administered Medications  Medication Dose Route Frequency Provider Last Rate Last Dose  . 0.9 %  sodium chloride infusion   Intravenous Continuous Lennette Bihari, MD 50 mL/hr at 08/12/12 1130    . 0.9 %  sodium chloride infusion  100 mL Intravenous PRN Sheffield Slider, PA      . 0.9 %  sodium chloride infusion  100 mL Intravenous PRN Sheffield Slider, PA      . acetaminophen (TYLENOL) tablet 650 mg  650 mg Oral Q6H PRN Sheffield Slider, PA       Or  . acetaminophen (TYLENOL) suppository 650 mg  650 mg Rectal Q6H PRN Sheffield Slider, PA      . ALPRAZolam Prudy Feeler) tablet 0.25 mg  0.25 mg Oral TID PRN Abelino Derrick, PA   0.25 mg at 08/12/12 2154  . alteplase (CATHFLO ACTIVASE) injection 2 mg  2 mg Intracatheter Once PRN Sheffield Slider, PA      . calcium carbonate (dosed in mg elemental calcium) suspension 500 mg of elemental calcium  500 mg of elemental calcium Oral Q6H PRN Sheffield Slider, PA      . camphor-menthol [facility]) lotion 1 application  1 application Topical Q8H PRN Sheffield Slider, PA       And  . hydrOXYzine (ATARAX/VISTARIL) tablet 25 mg  25 mg Oral Q8H PRN Sheffield Slider, PA      .  darbepoetin (ARANESP) injection 60 mcg  60 mcg Intravenous Q Thu-HD Donald Pore, PA      . docusate sodium [facility]) enema 283 mg  1 enema Rectal PRN Sheffield Slider, PA      . ferric gluconate (NULECIT) 125 mg in sodium chloride 0.9 % 100 mL IVPB  125 mg Intravenous Once in dialysis Sheffield Slider, PA   125 mg at 08/13/12 0755  . gabapentin (NEURONTIN) capsule 200 mg  200 mg Oral QHS Eda Paschal [place], [place]   200 mg at 08/12/12 2150  . going for heart surgery book   Does not apply Once Lennette Bihari, MD      . heparin ADULT infusion 100 units/mL (25000 units/250 mL)  800 Units/hr Intravenous Continuous Lennette Bihari, MD 8 mL/hr at 08/13/12 0333 800 Units/hr at 08/13/12 0333  . heparin injection 1,000 Units  20 Units/kg Dialysis PRN Sheffield Slider, PA      . hydrOXYzine (VISTARIL) capsule 50 mg  50 mg Oral QHS Eda Paschal [place], [place]   50 mg at 08/12/12 2152  . isosorbide mononitrate (IMDUR) 24 hr tablet 30 mg  30 mg Oral Daily Abelino Derrick,  PA   30 mg at 08/12/12 1730  . lidocaine (XYLOCAINE) 1 % injection 5 mL  5 mL Intradermal PRN Sheffield Slider, PA      . lidocaine-prilocaine (EMLA) cream 1 application  1 application Topical PRN Sheffield Slider, PA      . metoprolol tartrate (LOPRESSOR) tablet 25 mg  25 mg Oral BID Eda Paschal [place], [place]   25 mg at 08/12/12 2154  . multivitamin (RENA-VIT) tablet 1 tablet  1 tablet Oral QHS Sheffield Slider, PA   1 tablet at 08/12/12 2152  . nitroGLYCERIN (NITROSTAT) SL tablet 0.4 mg  0.4 mg Sublingual Q5 min PRN Abelino Derrick, PA      . olopatadine (PATANOL) 0.1 % ophthalmic solution 1 drop  1 drop Both Eyes BID PRN Lennette Bihari, MD      . ondansetron [facility]) tablet 4 mg  4 mg Oral Q6H PRN Sheffield Slider, PA       Or  . ondansetron (ZOFRAN) injection 4 mg  4 mg Intravenous Q6H PRN Sheffield Slider, PA      . paricalcitol (ZEMPLAR) 5 MCG/ML injection           . paricalcitol (ZEMPLAR) injection 1 mcg  1 mcg Intravenous Q T,Th,Sa-HD Sheffield Slider,  PA      . pentafluoroprop-tetrafluoroeth (GEBAUERS) aerosol 1 application  1 application Topical PRN Sheffield Slider, PA      . polyethylene glycol (MIRALAX / GLYCOLAX) packet 17 g  17 g Oral Daily PRN Abelino Derrick, PA      . sevelamer (RENVELA) tablet 800 mg  800 mg Oral Q breakfast Luke K [place], [place]      . sorbitol 70 % solution 30 mL  30 mL Oral PRN Sheffield Slider, PA      . traMADol Janean Sark) tablet 50 mg  50 mg Oral Q6H PRN Abelino Derrick, PA      . zolpidem (AMBIEN) tablet 5 mg  5 mg Oral QHS PRN Sheffield Slider, PA      . DISCONTD: 0.9 %  sodium chloride infusion  250 mL Intravenous PRN Lennette Bihari, MD      . DISCONTD: 0.9 %  sodium chloride infusion   Intravenous Continuous Lennette Bihari, MD      . DISCONTD: acetaminophen (TYLENOL) tablet 650 mg  650 mg Oral Q4H PRN Lennette Bihari, MD      . DISCONTD: dextrose 5 %-0.45 % sodium chloride infusion   Intravenous Continuous Lennette Bihari, MD 50 mL/hr at 08/12/12 9604    . DISCONTD: heparin injection 1,000 Units  1,000 Units Dialysis PRN Sheffield Slider, PA      . DISCONTD: ketotifen (ZADITOR) 0.025 % ophthalmic solution 1 drop  1 drop Both Eyes BID Eda Paschal [place], [place]      . DISCONTD: ondansetron (ZOFRAN) injection 4 mg  4 mg Intravenous Q6H PRN Lennette Bihari, MD      . DISCONTD: sodium chloride 0.9 % injection 3 mL  3 mL Intravenous Q12H Lennette Bihari, MD      . DISCONTD: sodium chloride 0.9 % injection 3 mL  3 mL Intravenous PRN Lennette Bihari, MD      . DISCONTD: zolpidem (AMBIEN) tablet 5 mg  5 mg Oral QHS PRN Abelino Derrick, PA        PE: General appearance: alert, cooperative and no distress Lungs: clear to auscultation bilaterally Heart: regular rate  and rhythm, S1, S2 normal, no murmur, click, rub or gallop Extremities: mild edema in the right knee. Pulses: 2+ and symmetric Skin: faint bruising in the right knee. Neurologic: Grossly normal  Lab Results:   Basename 08/13/12 0118 08/12/12 1413  WBC 7.7 6.2  HGB  10.9* 12.5  HCT 32.7* 37.7  PLT 145* 141*   BMET  Basename 08/13/12 0707  NA 142  K 4.7  CL 109  CO2 20  GLUCOSE 134*  BUN 47*  CREATININE 4.23*  CALCIUM 9.4   PT/INR  Basename 08/12/12 1413  LABPROT 13.2  INR 1.01   Studies/Results: Cardiac Catheterization  Cleda Daub, 77 y.o., female  Full note dictated; see diagram in chart  DICTATION # [id number], [id number]  Ao: 80/44  LV: 80/9/15  LM: nl  LAD: 30 - 40% ostial, 99% superior branch of Dx1 with Tim[place] 2 flow, 90% LAD post Dx1  LCX; 80% OM1  RCA: 70 - 80% prox, 50% mid, 80% PDA at ostium  Mod LVH, EF 60%  Aortic arch performed, see dictation.  Will obtain surgical consult for possible CABG, if turned down then consider multivessel PCI. Will heparinize for Timi2 flow with subtotal DX.  Lennette Bihari, MD, [facility]  08/12/2012  Assessment/Plan  Principal Problem:  *CAD (coronary artery disease): Severe three vessel Active Problems:  ESRD (end stage renal disease) on dialysis  Plan:  S/P left heart cath revealing severe three vessel disease.  Dr. Laneta Simmers discussing CABG with patient.  Mildly hypotensive this AM.  In HD.   The patient is very apprehensive about having the surgery.  It appears the that her husband is pushing for her.  I listened to her for about 20 min as she became tearful and explained how she has no help at home and even when she was having CP while washing the floor, her husband would not help finish.  Bottom line is she likely will need short term SNF after the procedure so she gets adequate rest.  Ice for knee.  I do not think it requires an xray but it may slow her ambulation after CABG.   LOS: 1 day    Erionna Strum 08/13/2012 9:33 AM

## 2012-08-13 NOTE — Significant Event (Signed)
Rapid Response Event Note  Overview: Time Called: 1330 Arrival Time: 1332 Event Type: Hypotension;Respiratory  Initial Focused Assessment: Called to Respiratory Department.  Patient waiting for Pulmonary Function Testing.  Per respiratory therapist.  Patient alert and oriented upon arrive, pt complained of being tired, hungry and cold.  A few minutes later patient slumped over in wheelchair.  Patient became unresponsive with periods of apnea.  RT began bagging pt and called Code Blue.  Upon arrival patient assisted to bed, pt mildly responsive and able to breath without assistance.  Placed on Zoll monitor: SR 70s.  RR 16 BP 90/50, O2 sat 100% on NRB.  NS started to PIV. 100cc infused. Attending notified.  Patient transferred to 2309.  Interventions:   Event Summary: Name of Physician Notified: Dr Rennis Golden at 1345    at    Outcome: Transferred (Comment) (2300)  Event End Time: 1445  Marcellina Millin

## 2012-08-13 NOTE — Progress Notes (Addendum)
VASCULAR LAB PRELIMINARY  PRELIMINARY  PRELIMINARY  PRELIMINARY  Pre-op Cardiac Surgery  Carotid Findings:  Right - No evidence of ICA stenosis. Left - There is a 40% to 59% proximal ICA stenosis lowest end of scale by velocity. Plaque morphology does not support the increase. Probably due to tortuosity. Bilateral vertebral artery flow is antegrade.     Upper Extremity Right Left  Brachial Pressures 99 Triphasic Restricted Limb Triphasic  Radial Waveforms Triphasic Triphasic  Ulnar Waveforms Triphasic Triphasic  Palmar Arch (Allen's Test) Abnormal Normal   Findings:  Doppler waveforms remained normal with radial compression and obliterated with ulnar compression on the right. Left Doppler waveforms remained normal with both radial and ulnar compressions.    Lower  Extremity Right Left  Dorsalis Pedis    Anterior Tibial    Posterior Tibial    Ankle/Brachial Indices      Findings:  Pedal pulses were palpable bilaterally.   Jacqualyn Sedgwick, RVS 08/13/2012, 4:20 PM  BIGGS, SANDRA, RVT 08/13/2012, 4:20 PM

## 2012-08-13 NOTE — Code Documentation (Signed)
Responded to Code in Respiratory.  During PFT's, Diane [name] slumped over in the chair and was minimally responsive.   Patient was actively being treated when I arrived.  Pulse was present and patient was in NSR.  Patient was bagged and subsequently improved.  Patient was then transported to cardiac ICU.

## 2012-08-14 ENCOUNTER — Inpatient Hospital Stay (HOSPITAL_COMMUNITY): Payer: Medicare Other | Admitting: Certified Registered Nurse Anesthetist

## 2012-08-14 ENCOUNTER — Inpatient Hospital Stay (HOSPITAL_COMMUNITY): Payer: Medicare Other

## 2012-08-14 ENCOUNTER — Encounter (HOSPITAL_COMMUNITY): Payer: Self-pay | Admitting: Certified Registered Nurse Anesthetist

## 2012-08-14 ENCOUNTER — Encounter (HOSPITAL_COMMUNITY)
Admission: RE | Disposition: A | Discharge status: Skilled Nursing Facility | Payer: Self-pay | Source: Ambulatory Visit | Attending: Surgery

## 2012-08-14 DIAGNOSIS — I2510 Atherosclerotic heart disease of native coronary artery without angina pectoris: Secondary | ICD-10-CM

## 2012-08-14 HISTORY — PX: CORONARY ARTERY BYPASS GRAFT: SHX141

## 2012-08-14 HISTORY — PX: ENDOVEIN HARVEST OF GREATER SAPHENOUS VEIN: SHX5059

## 2012-08-14 LAB — POCT I-STAT 4, (NA,K, GLUC, HGB,HCT)
Glucose, Bld: 128 mg/dL — ABNORMAL HIGH (ref 70–99)
Glucose, Bld: 118 mg/dL — ABNORMAL HIGH (ref 70–99)
Glucose, Bld: 95 mg/dL (ref 70–99)
Glucose, Bld: 92 mg/dL (ref 70–99)
HCT: 32 % — ABNORMAL LOW (ref 36.0–46.0)
HCT: 19 % — ABNORMAL LOW (ref 36.0–46.0)
HCT: 23 % — ABNORMAL LOW (ref 36.0–46.0)
Hemoglobin: 10.9 g/dL — ABNORMAL LOW (ref 12.0–15.0)
Hemoglobin: 6.5 g/dL — CL (ref 12.0–15.0)
Hemoglobin: 8.8 g/dL — ABNORMAL LOW (ref 12.0–15.0)
Hemoglobin: 10.9 g/dL — ABNORMAL LOW (ref 12.0–15.0)
Potassium: 3.5 mEq/L (ref 3.5–5.1)
Potassium: 4.5 mEq/L (ref 3.5–5.1)
Potassium: 5.3 mEq/L — ABNORMAL HIGH (ref 3.5–5.1)
Sodium: 145 mEq/L (ref 135–145)
Sodium: 142 mEq/L (ref 135–145)

## 2012-08-14 LAB — CBC
HCT: 31.3 % — ABNORMAL LOW (ref 36.0–46.0)
MCH: 31 pg (ref 26.0–34.0)
MCH: 31 pg (ref 26.0–34.0)
MCHC: 34.4 g/dL (ref 30.0–36.0)
MCHC: 34.8 g/dL (ref 30.0–36.0)
MCV: 91.1 fL (ref 78.0–100.0)
MCV: 88.9 fL (ref 78.0–100.0)
Platelets: 145 10*3/uL — ABNORMAL LOW (ref 150–400)
Platelets: 127 10*3/uL — ABNORMAL LOW (ref 150–400)
RBC: 3.92 MIL/uL (ref 3.87–5.11)
RBC: 3.9 MIL/uL (ref 3.87–5.11)
RDW: 13.9 % (ref 11.5–15.5)
RDW: 14.4 % (ref 11.5–15.5)
WBC: 12.9 10*3/uL — ABNORMAL HIGH (ref 4.0–10.5)

## 2012-08-14 LAB — ABO/RH: ABO/RH(D): A POS

## 2012-08-14 LAB — BASIC METABOLIC PANEL
BUN: 19 mg/dL (ref 6–23)
GFR calc Af Amer: 15 mL/min — ABNORMAL LOW (ref >90)
GFR calc non Af Amer: 13 mL/min — ABNORMAL LOW (ref >90)
Potassium: 3.1 mEq/L — ABNORMAL LOW (ref 3.5–5.1)
Sodium: 144 mEq/L (ref 135–145)

## 2012-08-14 LAB — URINALYSIS, ROUTINE W REFLEX MICROSCOPIC
Glucose, UA: 250 mg/dL — AB
Ketones, ur: NEGATIVE mg/dL
Nitrite: NEGATIVE
Protein, ur: 30 mg/dL — AB
pH: 7.5 (ref 5.0–8.0)

## 2012-08-14 LAB — HEPARIN LEVEL (UNFRACTIONATED): Heparin Unfractionated: 0.6 IU/mL (ref 0.30–0.70)

## 2012-08-14 LAB — POCT I-STAT 3, ART BLOOD GAS (G3+)
Bicarbonate: 19.7 mEq/L — ABNORMAL LOW (ref 20.0–24.0)
pCO2 arterial: 31 mmHg — ABNORMAL LOW (ref 35.0–45.0)
pH, Arterial: 7.411 (ref 7.350–7.450)
pH, Arterial: 7.408 (ref 7.350–7.450)
pO2, Arterial: 63 mmHg — ABNORMAL LOW (ref 80.0–100.0)

## 2012-08-14 LAB — URINE MICROSCOPIC-ADD ON

## 2012-08-14 LAB — PROTIME-INR: Prothrombin Time: 15.9 seconds — ABNORMAL HIGH (ref 11.6–15.2)

## 2012-08-14 LAB — GLUCOSE, CAPILLARY

## 2012-08-14 LAB — CREATININE, SERUM: Creatinine, Ser: 3.1 mg/dL — ABNORMAL HIGH (ref 0.50–1.10)

## 2012-08-14 LAB — PREPARE RBC (CROSSMATCH)

## 2012-08-14 LAB — PLATELET COUNT: Platelets: 90 10*3/uL — ABNORMAL LOW (ref 150–400)

## 2012-08-14 SURGERY — CORONARY ARTERY BYPASS GRAFTING (CABG)
Anesthesia: General | Site: Thigh | Wound class: Clean

## 2012-08-14 MED ORDER — METOPROLOL TARTRATE 12.5 MG HALF TABLET
12.5000 mg | ORAL_TABLET | Freq: Two times a day (BID) | ORAL | Status: DC
  Filled 2012-08-14 (×3): qty 1

## 2012-08-14 MED ORDER — THROMBIN 20000 UNITS EX KIT
PACK | CUTANEOUS | Status: DC | PRN
  Administered 2012-08-14: 20000 [IU] via TOPICAL

## 2012-08-14 MED ORDER — INSULIN REGULAR BOLUS VIA INFUSION
0.0000 [IU] | Freq: Three times a day (TID) | INTRAVENOUS | Status: DC
  Filled 2012-08-14: qty 10

## 2012-08-14 MED ORDER — PANTOPRAZOLE SODIUM 40 MG PO TBEC
40.0000 mg | DELAYED_RELEASE_TABLET | Freq: Every day | ORAL | Status: DC
  Administered 2012-08-16 – 2012-08-21 (×6): 40 mg via ORAL
  Filled 2012-08-14 (×7): qty 1

## 2012-08-14 MED ORDER — SODIUM CHLORIDE 0.9 % IJ SOLN: 3.0000 mL | INTRAMUSCULAR | Status: DC | PRN

## 2012-08-14 MED ORDER — HEMOSTATIC AGENTS (NO CHARGE) OPTIME
TOPICAL | Status: DC | PRN
  Administered 2012-08-14: 1 via TOPICAL

## 2012-08-14 MED ORDER — ALBUMIN HUMAN 5 % IV SOLN
250.0000 mL | INTRAVENOUS | Status: AC | PRN
  Administered 2012-08-14: 250 mL via INTRAVENOUS

## 2012-08-14 MED ORDER — PROPOFOL 10 MG/ML IV BOLUS
INTRAVENOUS | Status: DC | PRN
  Administered 2012-08-14: 20 mg via INTRAVENOUS

## 2012-08-14 MED ORDER — BISACODYL 10 MG RE SUPP: 10.0000 mg | Freq: Every day | RECTAL | Status: DC

## 2012-08-14 MED ORDER — ACETAMINOPHEN 500 MG PO TABS
1000.0000 mg | ORAL_TABLET | Freq: Four times a day (QID) | ORAL | Status: DC
  Administered 2012-08-15 – 2012-08-17 (×9): 1000 mg via ORAL
  Filled 2012-08-14 (×2): qty 2
  Filled 2012-08-14: qty 1
  Filled 2012-08-14 (×5): qty 2
  Filled 2012-08-14: qty 12
  Filled 2012-08-14 (×5): qty 2

## 2012-08-14 MED ORDER — 0.9 % SODIUM CHLORIDE (POUR BTL) OPTIME
TOPICAL | Status: DC | PRN
  Administered 2012-08-14: 6000 mL

## 2012-08-14 MED ORDER — HEMOSTATIC AGENTS (NO CHARGE) OPTIME
TOPICAL | Status: DC | PRN
  Administered 2012-08-14: 3 via TOPICAL

## 2012-08-14 MED ORDER — PHENYLEPHRINE HCL 10 MG/ML IJ SOLN
INTRAMUSCULAR | Status: DC | PRN
  Administered 2012-08-14 (×2): 40 ug via INTRAVENOUS

## 2012-08-14 MED ORDER — PHENYLEPHRINE HCL 10 MG/ML IJ SOLN
0.0000 ug/min | INTRAVENOUS | Status: DC
  Filled 2012-08-14: qty 2

## 2012-08-14 MED ORDER — MIDAZOLAM HCL 5 MG/5ML IJ SOLN
INTRAMUSCULAR | Status: DC | PRN
  Administered 2012-08-14: 2 mg via INTRAVENOUS
  Administered 2012-08-14: 4 mg via INTRAVENOUS
  Administered 2012-08-14: 5 mg via INTRAVENOUS
  Administered 2012-08-14: 4 mg via INTRAVENOUS
  Administered 2012-08-14: 5 mg via INTRAVENOUS

## 2012-08-14 MED ORDER — MORPHINE SULFATE 2 MG/ML IJ SOLN: 1.0000 mg | INTRAMUSCULAR | Status: AC | PRN

## 2012-08-14 MED ORDER — ACETAMINOPHEN 10 MG/ML IV SOLN
1000.0000 mg | Freq: Once | INTRAVENOUS | Status: AC
  Administered 2012-08-14: 1000 mg via INTRAVENOUS

## 2012-08-14 MED ORDER — ARTIFICIAL TEARS OP OINT
TOPICAL_OINTMENT | OPHTHALMIC | Status: DC | PRN
  Administered 2012-08-14: 1 via OPHTHALMIC

## 2012-08-14 MED ORDER — NITROGLYCERIN IN D5W 200-5 MCG/ML-% IV SOLN: 0.0000 ug/min | INTRAVENOUS | Status: DC

## 2012-08-14 MED ORDER — PROTAMINE SULFATE 10 MG/ML IV SOLN
INTRAVENOUS | Status: DC | PRN
  Administered 2012-08-14: 20 mg via INTRAVENOUS
  Administered 2012-08-14: 50 mg via INTRAVENOUS
  Administered 2012-08-14: 30 mg via INTRAVENOUS
  Administered 2012-08-14: 20 mg via INTRAVENOUS
  Administered 2012-08-14: 10 mg via INTRAVENOUS
  Administered 2012-08-14: 50 mg via INTRAVENOUS
  Administered 2012-08-14: 30 mg via INTRAVENOUS

## 2012-08-14 MED ORDER — SODIUM CHLORIDE 0.9 % IV SOLN: 250.0000 mL | INTRAVENOUS | Status: DC

## 2012-08-14 MED ORDER — VECURONIUM BROMIDE 10 MG IV SOLR
INTRAVENOUS | Status: DC | PRN
  Administered 2012-08-14: 5 mg via INTRAVENOUS
  Administered 2012-08-14: 10 mg via INTRAVENOUS
  Administered 2012-08-14: 5 mg via INTRAVENOUS

## 2012-08-14 MED ORDER — MAGNESIUM SULFATE 40 MG/ML IJ SOLN: 4.0000 g | Freq: Once | INTRAMUSCULAR | Status: DC

## 2012-08-14 MED ORDER — MORPHINE SULFATE 2 MG/ML IJ SOLN
2.0000 mg | INTRAMUSCULAR | Status: DC | PRN
  Administered 2012-08-15 (×2): 2 mg via INTRAVENOUS
  Filled 2012-08-14: qty 2
  Filled 2012-08-14: qty 1

## 2012-08-14 MED ORDER — SODIUM CHLORIDE 0.9 % IJ SOLN
3.0000 mL | Freq: Two times a day (BID) | INTRAMUSCULAR | Status: DC
  Administered 2012-08-15 – 2012-08-17 (×5): 3 mL via INTRAVENOUS

## 2012-08-14 MED ORDER — LACTATED RINGERS IV SOLN: INTRAVENOUS | Status: DC

## 2012-08-14 MED ORDER — ASPIRIN 81 MG PO CHEW: 324.0000 mg | CHEWABLE_TABLET | Freq: Every day | ORAL | Status: DC

## 2012-08-14 MED ORDER — SODIUM CHLORIDE 0.45 % IV SOLN: INTRAVENOUS | Status: DC

## 2012-08-14 MED ORDER — FAMOTIDINE IN NACL 20-0.9 MG/50ML-% IV SOLN
20.0000 mg | Freq: Two times a day (BID) | INTRAVENOUS | Status: AC
  Administered 2012-08-14 (×2): 20 mg via INTRAVENOUS
  Filled 2012-08-14: qty 50

## 2012-08-14 MED ORDER — FENTANYL CITRATE 0.05 MG/ML IJ SOLN
INTRAMUSCULAR | Status: DC | PRN
  Administered 2012-08-14: 100 ug via INTRAVENOUS
  Administered 2012-08-14: 250 ug via INTRAVENOUS
  Administered 2012-08-14 (×2): 100 ug via INTRAVENOUS
  Administered 2012-08-14: 250 ug via INTRAVENOUS
  Administered 2012-08-14: 150 ug via INTRAVENOUS
  Administered 2012-08-14: 250 ug via INTRAVENOUS
  Administered 2012-08-14 (×3): 100 ug via INTRAVENOUS

## 2012-08-14 MED ORDER — VANCOMYCIN HCL IN DEXTROSE 1-5 GM/200ML-% IV SOLN
1000.0000 mg | Freq: Once | INTRAVENOUS | Status: AC
  Administered 2012-08-14: 1000 mg via INTRAVENOUS
  Filled 2012-08-14: qty 200

## 2012-08-14 MED ORDER — OXYCODONE HCL 5 MG PO TABS
5.0000 mg | ORAL_TABLET | ORAL | Status: DC | PRN
  Administered 2012-08-17 – 2012-08-21 (×6): 5 mg via ORAL
  Filled 2012-08-14: qty 2
  Filled 2012-08-14 (×5): qty 1

## 2012-08-14 MED ORDER — DEXTROSE 5 % IV SOLN
1.5000 g | Freq: Two times a day (BID) | INTRAVENOUS | Status: DC
  Administered 2012-08-14: 1.5 g via INTRAVENOUS
  Filled 2012-08-14 (×2): qty 1.5

## 2012-08-14 MED ORDER — BISACODYL 5 MG PO TBEC
10.0000 mg | DELAYED_RELEASE_TABLET | Freq: Every day | ORAL | Status: DC
  Administered 2012-08-15 – 2012-08-17 (×3): 10 mg via ORAL
  Filled 2012-08-14 (×3): qty 2

## 2012-08-14 MED ORDER — SODIUM CHLORIDE 0.9 % IV SOLN
INTRAVENOUS | Status: DC
  Filled 2012-08-14: qty 1

## 2012-08-14 MED ORDER — SODIUM CHLORIDE 0.9 % IV SOLN: INTRAVENOUS | Status: DC

## 2012-08-14 MED ORDER — ROCURONIUM BROMIDE 100 MG/10ML IV SOLN
INTRAVENOUS | Status: DC | PRN
  Administered 2012-08-14: 40 mg via INTRAVENOUS

## 2012-08-14 MED ORDER — THROMBIN 20000 UNITS EX SOLR
CUTANEOUS | Status: AC
  Filled 2012-08-14: qty 20000

## 2012-08-14 MED ORDER — ONDANSETRON HCL 4 MG/2ML IJ SOLN: 4.0000 mg | Freq: Four times a day (QID) | INTRAMUSCULAR | Status: DC | PRN

## 2012-08-14 MED ORDER — POTASSIUM CHLORIDE 10 MEQ/50ML IV SOLN: 10.0000 meq | INTRAVENOUS | Status: AC

## 2012-08-14 MED ORDER — INSULIN ASPART 100 UNIT/ML ~~LOC~~ SOLN
0.0000 [IU] | SUBCUTANEOUS | Status: DC
  Administered 2012-08-14: 4 [IU] via SUBCUTANEOUS
  Administered 2012-08-14 – 2012-08-15 (×2): 2 [IU] via SUBCUTANEOUS
  Administered 2012-08-15: 4 [IU] via SUBCUTANEOUS
  Administered 2012-08-16: 2 [IU] via SUBCUTANEOUS

## 2012-08-14 MED ORDER — CEFUROXIME SODIUM 750 MG IJ SOLR
750.0000 mg | Freq: Two times a day (BID) | INTRAMUSCULAR | Status: DC
  Filled 2012-08-14 (×2): qty 750

## 2012-08-14 MED ORDER — LACTATED RINGERS IV SOLN: 500.0000 mL | Freq: Once | INTRAVENOUS | Status: AC | PRN

## 2012-08-14 MED ORDER — HEPARIN SODIUM (PORCINE) 1000 UNIT/ML IJ SOLN
INTRAMUSCULAR | Status: DC | PRN
  Administered 2012-08-14: 10000 [IU] via INTRAVENOUS
  Administered 2012-08-14: 5000 [IU] via INTRAVENOUS
  Administered 2012-08-14: 4000 [IU] via INTRAVENOUS

## 2012-08-14 MED ORDER — ACETAMINOPHEN 160 MG/5ML PO SOLN
975.0000 mg | Freq: Four times a day (QID) | ORAL | Status: DC
  Administered 2012-08-15: 975 mg
  Filled 2012-08-14: qty 40.6

## 2012-08-14 MED ORDER — ASPIRIN EC 325 MG PO TBEC
325.0000 mg | DELAYED_RELEASE_TABLET | Freq: Every day | ORAL | Status: DC
  Administered 2012-08-15 – 2012-08-17 (×3): 325 mg via ORAL
  Filled 2012-08-14 (×3): qty 1

## 2012-08-14 MED ORDER — MIDAZOLAM HCL 2 MG/2ML IJ SOLN
2.0000 mg | INTRAMUSCULAR | Status: DC | PRN
  Administered 2012-08-14: 2 mg via INTRAVENOUS
  Filled 2012-08-14: qty 2

## 2012-08-14 MED ORDER — METOPROLOL TARTRATE 25 MG/10 ML ORAL SUSPENSION
12.5000 mg | Freq: Two times a day (BID) | ORAL | Status: DC
  Filled 2012-08-14 (×3): qty 5

## 2012-08-14 MED ORDER — DEXMEDETOMIDINE HCL IN NACL 200 MCG/50ML IV SOLN: 0.1000 ug/kg/h | INTRAVENOUS | Status: DC

## 2012-08-14 MED ORDER — DOCUSATE SODIUM 100 MG PO CAPS
200.0000 mg | ORAL_CAPSULE | Freq: Every day | ORAL | Status: DC
  Administered 2012-08-15 – 2012-08-17 (×3): 200 mg via ORAL
  Filled 2012-08-14 (×3): qty 2

## 2012-08-14 MED ORDER — LACTATED RINGERS IV SOLN
INTRAVENOUS | Status: DC | PRN
  Administered 2012-08-14: 08:00:00 via INTRAVENOUS

## 2012-08-14 MED ORDER — METOPROLOL TARTRATE 1 MG/ML IV SOLN
2.5000 mg | INTRAVENOUS | Status: DC | PRN
  Administered 2012-08-16: 5 mg via INTRAVENOUS
  Administered 2012-08-16: 2.5 mg via INTRAVENOUS
  Filled 2012-08-14 (×2): qty 5

## 2012-08-14 MED FILL — Medication: Qty: 1 | Status: AC

## 2012-08-14 SURGICAL SUPPLY — 98 items
ATTRACTOMAT 16X20 MAGNETIC DRP (DRAPES) ×3 IMPLANT
BAG DECANTER FOR FLEXI CONT (MISCELLANEOUS) ×3 IMPLANT
BANDAGE ELASTIC 4 VELCRO ST LF (GAUZE/BANDAGES/DRESSINGS) ×3 IMPLANT
BANDAGE ELASTIC 6 VELCRO ST LF (GAUZE/BANDAGES/DRESSINGS) ×3 IMPLANT
BANDAGE GAUZE ELAST BULKY 4 IN (GAUZE/BANDAGES/DRESSINGS) ×3 IMPLANT
BASKET HEART (ORDER IN 25'S) (MISCELLANEOUS) ×1
BASKET HEART (ORDER IN 25S) (MISCELLANEOUS) ×2 IMPLANT
BLADE STERNUM SYSTEM 6 (BLADE) ×3 IMPLANT
CANISTER SUCTION 2500CC (MISCELLANEOUS) ×3 IMPLANT
CATH ROBINSON RED A/P 18FR (CATHETERS) ×6 IMPLANT
CATH THORACIC 28FR (CATHETERS) ×3 IMPLANT
CATH THORACIC 28FR RT ANG (CATHETERS) IMPLANT
CATH THORACIC 36FR (CATHETERS) ×3 IMPLANT
CATH THORACIC 36FR RT ANG (CATHETERS) ×3 IMPLANT
CLIP RETRACTION 3.0MM CORONARY (MISCELLANEOUS) ×1 IMPLANT
CLIP TI MEDIUM 24 (CLIP) IMPLANT
CLIP TI WIDE RED SMALL 24 (CLIP) IMPLANT
CLOTH BEACON ORANGE TIMEOUT ST (SAFETY) ×3 IMPLANT
COVER SURGICAL LIGHT HANDLE (MISCELLANEOUS) ×3 IMPLANT
CRADLE DONUT ADULT HEAD (MISCELLANEOUS) ×3 IMPLANT
DRAPE CARDIOVASCULAR INCISE (DRAPES) ×3
DRAPE SLUSH MACHINE 52X66 (DRAPES) IMPLANT
DRAPE SLUSH/WARMER DISC (DRAPES) ×1 IMPLANT
DRAPE SRG 135X102X78XABS (DRAPES) ×2 IMPLANT
DRSG COVADERM 4X14 (GAUZE/BANDAGES/DRESSINGS) ×3 IMPLANT
ELECT CAUTERY BLADE 6.4 (BLADE) ×3 IMPLANT
ELECT REM PT RETURN 9FT ADLT (ELECTROSURGICAL) ×6
ELECTRODE REM PT RTRN 9FT ADLT (ELECTROSURGICAL) ×4 IMPLANT
GLOVE BIO SURGEON STRL SZ 6 (GLOVE) ×1 IMPLANT
GLOVE BIO SURGEON STRL SZ 6.5 (GLOVE) ×1 IMPLANT
GLOVE BIO SURGEON STRL SZ7 (GLOVE) ×2 IMPLANT
GLOVE BIO SURGEON STRL SZ7.5 (GLOVE) IMPLANT
GLOVE BIOGEL PI IND STRL 6 (GLOVE) IMPLANT
GLOVE BIOGEL PI IND STRL 6.5 (GLOVE) IMPLANT
GLOVE BIOGEL PI IND STRL 7.0 (GLOVE) IMPLANT
GLOVE BIOGEL PI INDICATOR 6 (GLOVE)
GLOVE BIOGEL PI INDICATOR 6.5 (GLOVE)
GLOVE BIOGEL PI INDICATOR 7.0 (GLOVE)
GLOVE EUDERMIC 7 POWDERFREE (GLOVE) ×6 IMPLANT
GLOVE ORTHO TXT STRL SZ7.5 (GLOVE) IMPLANT
GOWN PREVENTION PLUS XLARGE (GOWN DISPOSABLE) ×3 IMPLANT
GOWN STRL NON-REIN LRG LVL3 (GOWN DISPOSABLE) ×12 IMPLANT
HEMOSTAT POWDER SURGIFOAM 1G (HEMOSTASIS) ×9 IMPLANT
HEMOSTAT SURGICEL 2X14 (HEMOSTASIS) ×3 IMPLANT
INSERT FOGARTY 61MM (MISCELLANEOUS) IMPLANT
INSERT FOGARTY XLG (MISCELLANEOUS) IMPLANT
KIT BASIN OR (CUSTOM PROCEDURE TRAY) ×3 IMPLANT
KIT CATH CPB BARTLE (MISCELLANEOUS) ×3 IMPLANT
KIT ROOM TURNOVER OR (KITS) ×3 IMPLANT
KIT SUCTION CATH 14FR (SUCTIONS) ×3 IMPLANT
KIT VASOVIEW W/TROCAR VH 2000 (KITS) ×3 IMPLANT
NS IRRIG 1000ML POUR BTL (IV SOLUTION) ×15 IMPLANT
PACK OPEN HEART (CUSTOM PROCEDURE TRAY) ×3 IMPLANT
PAD ARMBOARD 7.5X6 YLW CONV (MISCELLANEOUS) ×6 IMPLANT
PENCIL BUTTON HOLSTER BLD 10FT (ELECTRODE) ×3 IMPLANT
PUNCH AORTIC ROTATE 4.0MM (MISCELLANEOUS) IMPLANT
PUNCH AORTIC ROTATE 4.5MM 8IN (MISCELLANEOUS) ×3 IMPLANT
PUNCH AORTIC ROTATE 5MM 8IN (MISCELLANEOUS) IMPLANT
SPONGE GAUZE 4X4 12PLY (GAUZE/BANDAGES/DRESSINGS) ×6 IMPLANT
SPONGE INTESTINAL PEANUT (DISPOSABLE) IMPLANT
SPONGE LAP 18X18 X RAY DECT (DISPOSABLE) IMPLANT
SPONGE LAP 4X18 X RAY DECT (DISPOSABLE) ×3 IMPLANT
SUT BONE WAX W31G (SUTURE) ×3 IMPLANT
SUT MNCRL AB 4-0 PS2 18 (SUTURE) ×2 IMPLANT
SUT PROLENE 3 0 SH DA (SUTURE) IMPLANT
SUT PROLENE 3 0 SH1 36 (SUTURE) ×3 IMPLANT
SUT PROLENE 4 0 RB 1 (SUTURE)
SUT PROLENE 4 0 SH DA (SUTURE) IMPLANT
SUT PROLENE 4-0 RB1 .5 CRCL 36 (SUTURE) IMPLANT
SUT PROLENE 5 0 C 1 36 (SUTURE) IMPLANT
SUT PROLENE 6 0 C 1 30 (SUTURE) ×2 IMPLANT
SUT PROLENE 7 0 BV 1 (SUTURE) IMPLANT
SUT PROLENE 7 0 BV1 MDA (SUTURE) ×5 IMPLANT
SUT PROLENE 8 0 BV175 6 (SUTURE) IMPLANT
SUT SILK  1 MH (SUTURE)
SUT SILK 1 MH (SUTURE) IMPLANT
SUT STEEL 6MS V (SUTURE) ×2 IMPLANT
SUT STEEL STERNAL CCS#1 18IN (SUTURE) IMPLANT
SUT STEEL SZ 6 DBL 3X14 BALL (SUTURE) IMPLANT
SUT VIC AB 1 CTX 36 (SUTURE) ×6
SUT VIC AB 1 CTX36XBRD ANBCTR (SUTURE) ×4 IMPLANT
SUT VIC AB 2-0 CT1 27 (SUTURE) ×3
SUT VIC AB 2-0 CT1 TAPERPNT 27 (SUTURE) IMPLANT
SUT VIC AB 2-0 CTX 27 (SUTURE) IMPLANT
SUT VIC AB 3-0 SH 27 (SUTURE)
SUT VIC AB 3-0 SH 27X BRD (SUTURE) IMPLANT
SUT VIC AB 3-0 X1 27 (SUTURE) IMPLANT
SUT VICRYL 4-0 PS2 18IN ABS (SUTURE) IMPLANT
SUTURE E-PAK OPEN HEART (SUTURE) ×3 IMPLANT
SYSTEM SAHARA CHEST DRAIN ATS (WOUND CARE) ×3 IMPLANT
TAPE CLOTH SOFT 2X10 (GAUZE/BANDAGES/DRESSINGS) ×1 IMPLANT
TOWEL OR 17X24 6PK STRL BLUE (TOWEL DISPOSABLE) ×3 IMPLANT
TOWEL OR 17X26 10 PK STRL BLUE (TOWEL DISPOSABLE) ×3 IMPLANT
TRAY FOLEY IC TEMP SENS 14FR (CATHETERS) ×3 IMPLANT
TUBE SUCT INTRACARD DLP 20F (MISCELLANEOUS) ×3 IMPLANT
TUBING INSUFFLATION 10FT LAP (TUBING) ×3 IMPLANT
UNDERPAD 30X30 INCONTINENT (UNDERPADS AND DIAPERS) ×3 IMPLANT
WATER STERILE IRR 1000ML POUR (IV SOLUTION) ×6 IMPLANT

## 2012-08-14 NOTE — Anesthesia Postprocedure Evaluation (Signed)
  Anesthesia Post-op Note  Patient: Diane [name]  Procedure(s) Performed: Procedure(s) (LRB) with comments: CORONARY ARTERY BYPASS GRAFTING (CABG) (N/A) - times five, using left internal mammary ENDOVEIN HARVEST OF GREATER SAPHENOUS VEIN (Bilateral)  Patient Location: ICU  Anesthesia Type: General  Level of Consciousness: Patient remains intubated per anesthesia plan  Airway and Oxygen Therapy: Patient remains intubated per anesthesia plan and Patient placed on Ventilator (see vital sign flow sheet for setting)  Post-op Pain: none  Post-op Assessment: Post-op Vital signs reviewed, Patient's Cardiovascular Status Stable, Respiratory Function Stable, Patent Airway, No signs of Nausea or vomiting and Pain level controlled  Post-op Vital Signs: stable  Complications: No apparent anesthesia complications

## 2012-08-14 NOTE — Progress Notes (Signed)
Subjective:  SP CAGB x5  on Vent. Objective Vital signs in last 24 hours: Filed Vitals:   08/14/12 1415 08/14/12 1430 08/14/12 1445 08/14/12 1500  BP:      Pulse: 82 81 79 77  Temp: 96.3 F (35.7 C) 96.4 F (35.8 C) 96.4 F (35.8 C) 96.4 F (35.8 C)  TempSrc:      Resp: 12 12 12 12   Height:      Weight:      SpO2: 100% 100% 100% 100%   Weight change: -1.064 kg (-2 lb 5.5 oz)  Intake/Output Summary (Last 24 hours) at 08/14/12 1507 Last data filed at 08/14/12 1400  Gross per 24 hour  Intake 2949.31 ml  Output   4285 ml  Net -1335.69 ml   Labs: Basic Metabolic Panel:  Lab 08/14/12 8413 08/14/12 1146 08/14/12 1052 08/14/12 0445 08/13/12 0707  NA 146* 145 142 -- --  K 4.3 5.0 5.3* -- --  CL -- -- -- 107 109  CO2 -- -- -- 25 20  GLUCOSE 136* 150* 92 -- --  BUN -- -- -- 19 47*  CREATININE -- -- -- 3.27* 4.23*  CALCIUM -- -- -- 9.6 9.4  ALB -- -- -- -- --  PHOS -- -- -- -- 4.1   Liver Function Tests:  Lab 08/13/12 0707  AST --  ALT --  ALKPHOS --  BILITOT --  PROT --  ALBUMIN 3.6   No results found for this basename: LIPASE:3,AMYLASE:3 in the last 168 hours No results found for this basename: AMMONIA:3 in the last 168 hours CBC:  Lab 08/14/12 1300 08/14/12 1146 08/14/12 1052 08/14/12 1044 08/14/12 0445 08/13/12 0118 08/12/12 1413  WBC 17.0* -- -- -- 12.9* 7.7 --  NEUTROABS -- -- -- -- -- -- --  HGB 12.110.9* 8.8* 7.8* -- -- -- --  HCT 35.2*32.0* 26.0* 23.0* -- -- -- --  MCV 90.3 -- -- -- 91.1 90.3 90.8  PLT 127* -- -- 90* 145* -- --   Cardiac Enzymes: No results found for this basename: CKTOTAL:5,CKMB:5,CKMBINDEX:5,TROPONINI:5 in the last 168 hours CBG:  Lab 08/13/12 1345 08/12/12 0731  GLUCAP 127* 105*    Iron Studies: No results found for this basename: IRON,TIBC,TRANSFERRIN,FERRITIN in the last 72 hours Studies/Results: Dg Chest Portable 1 View  08/14/2012  *RADIOLOGY REPORT*  Clinical Data: Status post CABG.  PORTABLE CHEST - 1 VIEW   Comparison: Two-view chest 08/07/2012.  Findings: The patient is status post median sternotomy for CABG. Endotracheal tube terminates 4 cm above the carina, in satisfactory position.  The heart size is normal.  A Swan-Ganz catheter enters via a right IJ sheath.  The tip is in the main pulmonary outflow tract.  A left-sided chest tube in mediastinal drains are in place. A small left pleural effusion is evident.  There is no significant pneumothorax.  IMPRESSION:  1.  Status post median sternotomy for CABG. 2.  Small left pleural effusion. 3.  Support apparatus as above.   Original Report Authenticated By: Jamesetta Orleans. MATTERN, M.D.    Medications:    . sodium chloride 500 mL (08/14/12 1315)  . sodium chloride    . sodium chloride    . dexmedetomidine Stopped (08/14/12 1400)  . insulin (NOVOLIN-R) infusion 0.7 Units/hr (08/14/12 1315)  . lactated ringers    . nitroGLYCERIN Stopped (08/14/12 1315)  . phenylephrine (NEO-SYNEPHRINE) Adult infusion Stopped (08/14/12 1315)  . DISCONTD: sodium chloride    . DISCONTD: heparin 800 Units/hr (08/13/12 1900)      .  acetaminophen  1,000 mg Intravenous Once  . acetaminophen  1,000 mg Oral Q6H   Or  . acetaminophen (TYLENOL) oral liquid 160 mg/5 mL  975 mg Per Tube Q6H  . aminocaproic acid (AMICAR) for OHS   Intravenous To OR  . aspirin EC  325 mg Oral Daily   Or  . aspirin  324 mg Per Tube Daily  . bisacodyl  10 mg Oral Daily   Or  . bisacodyl  10 mg Rectal Daily  . bisacodyl  5 mg Oral Once  . cefUROXime (ZINACEF)  IV  1.5 g Intravenous To OR  . cefUROXime (ZINACEF)  IV  1.5 g Intravenous Q12H  . clorazepate  3.75 mg Oral QHS  . darbepoetin (ARANESP) injection - DIALYSIS  60 mcg Intravenous Q Thu-HD  . dexmedetomidine  0.1-0.7 mcg/kg/hr Intravenous To OR  . diazepam  5 mg Oral Once  . docusate sodium  200 mg Oral Daily  . famotidine (PEPCID) IV  20 mg Intravenous Q12H  . gabapentin  200 mg Oral QHS  . heparin-papaverine-plasmalyte  irrigation   Irrigation To OR  . hydrOXYzine  50 mg Oral QHS  . insulin (NOVOLIN-R) infusion   Intravenous To OR  . insulin regular  0-10 Units Intravenous TID WC  . magnesium sulfate  4 g Intravenous Once  . metoprolol tartrate  12.5 mg Oral BID   Or  . metoprolol tartrate  12.5 mg Per Tube BID  . metoprolol tartrate  12.5 mg Oral Once  . multivitamin  1 tablet Oral QHS  . nitroGLYCERIN  2-200 mcg/min Intravenous To OR  . pantoprazole  40 mg Oral Daily  . paricalcitol  1 mcg Intravenous Q T,Th,Sa-HD  . phenylephrine (NEO-SYNEPHRINE) Adult infusion  30-200 mcg/min Intravenous To OR  . potassium chloride  10 mEq Intravenous Q1 Hr x 3  . sevelamer  800 mg Oral Q breakfast  . sodium chloride  3 mL Intravenous Q12H  . vancomycin  1,000 mg Intravenous To OR  . vancomycin  1,000 mg Intravenous Once  . DISCONTD: cefUROXime (ZINACEF)  IV  750 mg Intravenous To OR  . DISCONTD: chlorhexidine  60 mL Topical Once  . DISCONTD: DOPamine  2-20 mcg/kg/min Intravenous To OR  . DISCONTD: epinephrine  0.5-20 mcg/min Intravenous To OR  . DISCONTD: isosorbide mononitrate  30 mg Oral Daily  . DISCONTD: magnesium sulfate  40 mEq Other To OR  . DISCONTD: metoprolol tartrate  25 mg Oral BID  . DISCONTD: potassium chloride  80 mEq Other To OR   I  have reviewed scheduled and prn medications.  Physical Exam: General: Intubated no response to voice Heart: RRR, no rub or murmur, Sternal dressing dry and clean Lungs: CTA Abdomen: bs SL decr. soft Extremities: Dialysis Access: no pedal edema, surgical dressing dry bilat. Lower legs/  Pos. Bruit  L U A AVF  Dialysis Orders: Center: [facility] on TTS .Optilflux 180  EDW 52HD Bath 2K 2.25 Ca Time 3.5 Heparin 5000. Access left upper AVGG BFR 400 DFR a 1.5  Zemplar 1 mcg IV/HD Epogen none Venofer 100 q 2 weeks  Other Hgb 11.5 10/17, iPTH 163 10/17 ferritin 746 36 % sat 10/17  Assessment/Plan:  1. Multi-vessel CAD s/p cath by Dr. Tresa Endo; and CABG X 5 today  (10/25) per Dr. Laneta Simmers stable post op 2. ESRD - TTS( [facility]) K 4.3 after replacement iv  Prior 3.1. Next HD as needed, evaluate daily.  3. Hypertension/volume -Yesterday post wt  51.1( below her outpt. edw 52kg) Hemodynamically stable    BP=108/61.On Metoprolol 12.5 mg bid po 4. Anemia - Hgb 10.9 / with CAD started low dose Aranesp yesterday with HD / She has been on q 2 week IV Fe - will dose x 1 q weekly in hospital 5. Metabolic bone disease CA 9.6 and Phos 4.1 /on zemplar 1 mcg q hd And as outpt. renvela 800mg  breakfast and supper , in hosp. continue Only with breakfast  When taking po meds again 6. Peripheral neuropathy - continue neurontin 200mg  hs 7. Hx bipolar d/o - ON CHRONIC TRANXENE 3.75mg  "every night And Sometimes during Day  if I need it For Nervousness." / continue later as needed when awake Lenny Pastel, PA-C [facility]per [phone] 08/14/2012,3:07 PM  LOS: 2 days   Patient seen and examined and agree with assessment and plan as above with additions as indicated. Pt underwent 5 vessel CABG today. Hemodynamically stable post-op, I/O 2533+/1200 - today so far (making urine which will help volume status). Evaluate daily as to need for next dialysis. Vinson Moselle  MD [employer] [phone] pgr    [phone] cell 08/14/2012, 5:35 PM

## 2012-08-14 NOTE — OR Nursing (Signed)
Off pump call to [employer] and 1st call to SICU charge nurse.

## 2012-08-14 NOTE — Anesthesia Procedure Notes (Addendum)
Procedure Name: Intubation Date/Time: 08/14/2012 7:50 AM Performed by: Margaree Mackintosh Pre-anesthesia Checklist: Patient identified, Timeout performed, Emergency Drugs available, Suction available and Patient being monitored Patient Re-evaluated:Patient Re-evaluated prior to inductionOxygen Delivery Method: Circle system utilized Preoxygenation: Pre-oxygenation with 100% oxygen Intubation Type: IV induction Ventilation: Mask ventilation without difficulty Laryngoscope Size: Mac and 3 Grade View: Grade I Tube type: Oral Tube size: 7.5 mm Number of attempts: 1 Airway Equipment and Method: Stylet Placement Confirmation: ETT inserted through vocal cords under direct vision,  positive ETCO2 and breath sounds checked- equal and bilateral Secured at: 20 cm Tube secured with: Tape Dental Injury: Teeth and Oropharynx as per pre-operative assessment    Procedures: Right IJ Theone Murdoch Catheter Insertion:[id number] The patient was identified and consent obtained.  TO was performed, and full barrier precautions were used.  The skin was anesthetized with lidocaine-4cc plain with 25g needle.  Once the vein was located with the 22 ga. needle using ultrasound guidance , the wire was inserted into the vein.  The wire location was confirmed with ultrasound.  The tissue was dilated and the 8.5 [place] cordis catheter was carefully inserted then Theone Murdoch catheter.  The patient tolerated the procedure well.   CE

## 2012-08-14 NOTE — Progress Notes (Signed)
When Rt entered room vent on 60%. Per RN, Dr Laneta Simmers increased fio2 due to desat. Pt sating 100%, decreased fio2  to 50%.

## 2012-08-14 NOTE — Anesthesia Preprocedure Evaluation (Signed)
Anesthesia Evaluation  Patient identified by MRN, date of birth, ID band Patient awake    Reviewed: Allergy & Precautions, H&P , NPO status , Patient's Chart, lab work & pertinent test results  Airway Mallampati: I TM Distance: >3 FB Neck ROM: Full    Dental   Pulmonary  breath sounds clear to auscultation        Cardiovascular hypertension, + angina + CAD Rhythm:Regular Rate:Normal     Neuro/Psych Bipolar Disorder    GI/Hepatic   Endo/Other    Renal/GU ESRF     Musculoskeletal   Abdominal   Peds  Hematology   Anesthesia Other Findings   Reproductive/Obstetrics                           Anesthesia Physical Anesthesia Plan  ASA: III  Anesthesia Plan: General   Post-op Pain Management:    Induction: Intravenous  Airway Management Planned: Oral ETT  Additional Equipment: Arterial line and PA Cath  Intra-op Plan:   Post-operative Plan: Post-operative intubation/ventilation  Informed Consent: I have reviewed the patients History and Physical, chart, labs and discussed the procedure including the risks, benefits and alternatives for the proposed anesthesia with the patient or authorized representative who has indicated his/her understanding and acceptance.     Plan Discussed with: Surgeon and CRNA  Anesthesia Plan Comments:         Anesthesia Quick Evaluation

## 2012-08-14 NOTE — Transfer of Care (Signed)
Immediate Anesthesia Transfer of Care Note  Patient: Diane [name]  Procedure(s) Performed: Procedure(s) (LRB) with comments: CORONARY ARTERY BYPASS GRAFTING (CABG) (N/A) - times five, using left internal mammary ENDOVEIN HARVEST OF GREATER SAPHENOUS VEIN (Bilateral)  Patient Location: ICU  Anesthesia Type: General  Level of Consciousness: sedated and Patient remains intubated per anesthesia plan  Airway & Oxygen Therapy: Patient remains intubated per anesthesia plan and Patient placed on Ventilator (see vital sign flow sheet for setting)  Post-op Assessment: report to 2309 RN   Post vital signs: Reviewed and stable  Complications: No apparent anesthesia complications

## 2012-08-14 NOTE — Brief Op Note (Signed)
08/12/2012 - 08/14/2012  10:51 AM  PATIENT:  Diane [name]  77 y.o. female  PRE-OPERATIVE DIAGNOSIS:  CAD  POST-OPERATIVE DIAGNOSIS:  coronary artery disease  PROCEDURE:  Procedure(s) (LRB) with comments:  CORONARY ARTERY BYPASS GRAFTING x5 -LIMA to LAD -SVG to Diagonal 1 -SVG to OM1 -Sequential SVG to PDA, PLB   ENDOVEIN HARVEST OF GREATER SAPHENOUS VEIN RIGHT AND LEFT THIGH  SURGEON:  Surgeon(s) and Role:    * [name], MD - Primary  PHYSICIAN ASSISTANT: Erin Barrett PA-C  ANESTHESIA:   general  EBL:  Total I/O In: 900 [I.V.:900] Out: 275 [Urine:275]  BLOOD ADMINISTERED:1 Unit CC PRBC and  CC CELLSAVER  DRAINS: Left pleural chest tube, mediastinal chest drains   LOCAL MEDICATIONS USED:  NONE  SPECIMEN:  No Specimen  DISPOSITION OF SPECIMEN:  N/A  COUNTS:  YES  TOURNIQUET:  * No tourniquets in log *  DICTATION: .Dragon Dictation  PLAN OF CARE: Admit to inpatient   PATIENT DISPOSITION:  ICU - intubated and hemodynamically stable.   Delay start of Pharmacological VTE agent (>24hrs) due to surgical blood loss or risk of bleeding: yes

## 2012-08-14 NOTE — Preoperative (Signed)
Beta Blockers   Reason not to administer Beta Blockers:Not Applicable 

## 2012-08-14 NOTE — Progress Notes (Signed)
Patient ID: Diane [name], female   DOB: 1935-10-19, 77 y.o.   MRN: [id number]   SICU Afternoon rounds:  Hemodynamically stable. CI = 2.1  Still asleep on vent  Urine output ok.  ESRD on HD  CT output low  CBC    Component Value Date/Time   WBC 17.0* 08/14/2012 1300   RBC 3.90 08/14/2012 1300   HGB 12.1 08/14/2012 1300   HGB 10.9* 08/14/2012 1300   HCT 35.2* 08/14/2012 1300   HCT 32.0* 08/14/2012 1300   PLT 127* 08/14/2012 1300   MCV 90.3 08/14/2012 1300   [name] 31.0 08/14/2012 1300   MCHC 34.4 08/14/2012 1300   RDW 14.1 08/14/2012 1300    BMET    Component Value Date/Time   NA 146* 08/14/2012 1300   K 4.3 08/14/2012 1300   CL 107 08/14/2012 0445   CO2 25 08/14/2012 0445   GLUCOSE 136* 08/14/2012 1300   BUN 19 08/14/2012 0445   CREATININE 3.27* 08/14/2012 0445   CALCIUM 9.6 08/14/2012 0445   GFRNONAA 13* 08/14/2012 0445   GFRAA 15* 08/14/2012 0445    A/P: stable. Continue present course.

## 2012-08-14 NOTE — OR Nursing (Signed)
2nd call to SICU charge phone, rang to desk, talked Bill. Going to room 2309

## 2012-08-14 NOTE — Consult Note (Signed)
ANTIBIOTIC CONSULT NOTE   Pharmacy Consult for Vancomycin, Zinacef Indication: Post-op CABG x 5 in patient with history of ESRD.  Allergen  . Sulfa Antibiotics  . Tape    Patient Measurements: Height: 5\' 2"  (157.5 cm) Weight: 112 lb 10.5 oz (51.1 kg) IBW/kg (Calculated) : 50.1    Vital Signs: Temp: 95.7 F (35.4 C) (10/25 1615) Temp src: Oral (10/25 1300) BP: 123/62 mmHg (10/25 1600) Pulse Rate: 72  (10/25 1615) Intake/Output from previous day: 10/24 0701 - 10/25 0700 In: 418.9 [I.V.:418.9] Out: 2551 [Urine:2550; Stool:1] Intake/Output from this shift: Total I/O In: 2533.8 [I.V.:1710.8; Blood:823] Out: 2855 [Urine:1200; Blood:1555; Chest Tube:100]  Labs:  Basename 08/14/12 1300 08/14/12 1146 08/14/12 1052 08/14/12 1044 08/14/12 0445 08/13/12 0707 08/13/12 0118  WBC 17.0* -- -- -- 12.9* -- 7.7  HGB 12.110.9* 8.8* 7.8* -- -- -- --  PLT 127* -- -- 90* 145* -- --  CREATININE -- -- -- -- 3.27* 4.23* --   Estimated Creatinine Clearance: 11.8 ml/min (by C-G formula based on Cr of 3.27).    Microbiology: Recent Results (from the past 720 hour(s))  SURGICAL PCR SCREEN     Status: Normal   Collection Time   08/13/12  3:08 PM      Component Value Range Status Comment   MRSA, PCR NEGATIVE  NEGATIVE Final    Staphylococcus aureus NEGATIVE  NEGATIVE Final     Anti-infectives     Start     Dose/Rate Route Frequency Ordered Stop   08/15/12 0300   cefUROXime (ZINACEF) injection 750 mg        750 mg Intramuscular Every 12 hours 08/14/12 1645 08/16/12 1459   08/14/12 1900   vancomycin (VANCOCIN) IVPB 1000 mg/200 mL premix        1,000 mg 200 mL/hr over 60 Minutes Intravenous  Once 08/14/12 1301     08/14/12 1500   cefUROXime (ZINACEF) 1.5 g in dextrose 5 % 50 mL IVPB  Status:  Discontinued        1.5 g 100 mL/hr over 30 Minutes Intravenous Every 12 hours 08/14/12 1301 08/14/12 1646   08/14/12 0400   vancomycin (VANCOCIN) 1,000 mg in sodium chloride 0.9 % 250 mL IVPB          1,000 mg 250 mL/hr over 60 Minutes Intravenous To Surgery 08/13/12 1444 08/14/12 0740   08/14/12 0400   cefUROXime (ZINACEF) 1.5 g in dextrose 5 % 50 mL IVPB        1.5 g 100 mL/hr over 30 Minutes Intravenous To Surgery 08/13/12 1443 08/14/12 1130   08/14/12 0400   cefUROXime (ZINACEF) 750 mg in dextrose 5 % 50 mL IVPB  Status:  Discontinued        750 mg 100 mL/hr over 30 Minutes Intravenous To Surgery 08/13/12 1443 08/14/12 1301          Assessment:  76 y/o female with a history of ESRD now Day of Surgery following CABG x 5.  Vancomycin and Zinacef have been ordered post-op.  Current doses Vancomycin 1 gm IV x 1 tonight.  Zinacef 1.5 gm IV q 12 hours x 4 doses total.  Goal of Therapy:   Empiric post-op antibiotic coverage  Renal adjustment of antibiotics.  Plan:   Continue Vancomycin x 1 as ordered.  Change Zinacef to 750 mg IV q 12 hours x 3 more doses.  [Patient already received 1st of 4 Zinacef doses post-op.]  Mckinlee Dunk, Elisha Headland, Pharm.D.  08/14/2012 4:58 PM

## 2012-08-15 ENCOUNTER — Inpatient Hospital Stay (HOSPITAL_COMMUNITY): Payer: Medicare Other

## 2012-08-15 LAB — CBC
HCT: 32.9 % — ABNORMAL LOW (ref 36.0–46.0)
MCH: 30.7 pg (ref 26.0–34.0)
MCHC: 34.6 g/dL (ref 30.0–36.0)
MCHC: 33.4 g/dL (ref 30.0–36.0)
MCV: 88.8 fL (ref 78.0–100.0)
Platelets: 105 10*3/uL — ABNORMAL LOW (ref 150–400)
Platelets: 97 10*3/uL — ABNORMAL LOW (ref 150–400)
RDW: 14.7 % (ref 11.5–15.5)
RDW: 15.8 % — ABNORMAL HIGH (ref 11.5–15.5)
WBC: 13.1 10*3/uL — ABNORMAL HIGH (ref 4.0–10.5)

## 2012-08-15 LAB — CREATININE, SERUM
Creatinine, Ser: 4.05 mg/dL — ABNORMAL HIGH (ref 0.50–1.10)
GFR calc Af Amer: 11 mL/min — ABNORMAL LOW (ref >90)
GFR calc non Af Amer: 10 mL/min — ABNORMAL LOW (ref >90)

## 2012-08-15 LAB — GLUCOSE, CAPILLARY: Glucose-Capillary: 181 mg/dL — ABNORMAL HIGH (ref 70–99)

## 2012-08-15 LAB — URINE CULTURE: Colony Count: NO GROWTH

## 2012-08-15 LAB — BASIC METABOLIC PANEL
CO2: 18 mEq/L — ABNORMAL LOW (ref 19–32)
Calcium: 9.2 mg/dL (ref 8.4–10.5)
Chloride: 122 mEq/L — ABNORMAL HIGH (ref 96–112)
Creatinine, Ser: 3.57 mg/dL — ABNORMAL HIGH (ref 0.50–1.10)
GFR calc non Af Amer: 12 mL/min — ABNORMAL LOW (ref >90)
GFR calc non Af Amer: 11 mL/min — ABNORMAL LOW (ref >90)
Glucose, Bld: 132 mg/dL — ABNORMAL HIGH (ref 70–99)
Potassium: 4.8 mEq/L (ref 3.5–5.1)
Sodium: 152 mEq/L — ABNORMAL HIGH (ref 135–145)
Sodium: 153 mEq/L — ABNORMAL HIGH (ref 135–145)

## 2012-08-15 LAB — POCT I-STAT, CHEM 8
BUN: 19 mg/dL (ref 6–23)
Calcium, Ion: 1.14 mmol/L (ref 1.13–1.30)
Calcium, Ion: 1.36 mmol/L — ABNORMAL HIGH (ref 1.13–1.30)
Chloride: 121 mEq/L — ABNORMAL HIGH (ref 96–112)
Chloride: 125 mEq/L — ABNORMAL HIGH (ref 96–112)
Creatinine, Ser: 3.1 mg/dL — ABNORMAL HIGH (ref 0.50–1.10)
Creatinine, Ser: 3.7 mg/dL — ABNORMAL HIGH (ref 0.50–1.10)
Glucose, Bld: 133 mg/dL — ABNORMAL HIGH (ref 70–99)
Glucose, Bld: 140 mg/dL — ABNORMAL HIGH (ref 70–99)
Potassium: 5 mEq/L (ref 3.5–5.1)

## 2012-08-15 LAB — MAGNESIUM
Magnesium: 2.5 mg/dL (ref 1.5–2.5)
Magnesium: 2.7 mg/dL — ABNORMAL HIGH (ref 1.5–2.5)

## 2012-08-15 LAB — POCT I-STAT 3, ART BLOOD GAS (G3+)
Bicarbonate: 17.9 mEq/L — ABNORMAL LOW (ref 20.0–24.0)
TCO2: 19 mmol/L (ref 0–100)
pCO2 arterial: 32.1 mmHg — ABNORMAL LOW (ref 35.0–45.0)
pH, Arterial: 7.355 (ref 7.350–7.450)
pO2, Arterial: 127 mmHg — ABNORMAL HIGH (ref 80.0–100.0)

## 2012-08-15 LAB — PREPARE PLATELET PHERESIS

## 2012-08-15 MED ORDER — MIDAZOLAM HCL 2 MG/2ML IJ SOLN
1.0000 mg | Freq: Once | INTRAMUSCULAR | Status: AC
  Administered 2012-08-15: 1 mg via INTRAVENOUS

## 2012-08-15 MED ORDER — INSULIN ASPART 100 UNIT/ML ~~LOC~~ SOLN: 0.0000 [IU] | SUBCUTANEOUS | Status: DC

## 2012-08-15 MED ORDER — MIDAZOLAM HCL 2 MG/2ML IJ SOLN
INTRAMUSCULAR | Status: AC
  Filled 2012-08-15: qty 2

## 2012-08-15 MED ORDER — SODIUM CHLORIDE 0.9 % IJ SOLN: 10.0000 mL | INTRAMUSCULAR | Status: DC | PRN

## 2012-08-15 MED ORDER — DEXTROSE 5 % IV SOLN
INTRAVENOUS | Status: DC
  Administered 2012-08-15: 12:00:00 via INTRAVENOUS
  Administered 2012-08-16: 60 mL via INTRAVENOUS

## 2012-08-15 MED ORDER — METOPROLOL TARTRATE 25 MG/10 ML ORAL SUSPENSION
12.5000 mg | Freq: Two times a day (BID) | ORAL | Status: DC
  Filled 2012-08-15 (×6): qty 5

## 2012-08-15 MED ORDER — METOPROLOL TARTRATE 25 MG PO TABS
25.0000 mg | ORAL_TABLET | Freq: Two times a day (BID) | ORAL | Status: DC
  Administered 2012-08-15: 25 mg via ORAL
  Filled 2012-08-15 (×6): qty 1

## 2012-08-15 MED ORDER — DEXTROSE 5 % IV SOLN
750.0000 mg | Freq: Two times a day (BID) | INTRAVENOUS | Status: AC
  Administered 2012-08-15 – 2012-08-16 (×3): 750 mg via INTRAVENOUS
  Filled 2012-08-15 (×3): qty 750

## 2012-08-15 MED ORDER — SODIUM CHLORIDE 0.9 % IJ SOLN
10.0000 mL | Freq: Two times a day (BID) | INTRAMUSCULAR | Status: DC
  Administered 2012-08-15 – 2012-08-16 (×2): 10 mL

## 2012-08-15 NOTE — Op Note (Signed)
Diane [name], Diane [name]           ACCOUNT NO.:  [id number]  MEDICAL RECORD NO.:  [id number]  LOCATION:  2309                         FACILITY:  [facility]  PHYSICIAN:  Evelene Croon, M.D.     DATE OF BIRTH:  02/14/1935  DATE OF PROCEDURE:  08/14/2012 DATE OF DISCHARGE:                              OPERATIVE REPORT   PREOPERATIVE DIAGNOSIS:  Severe multivessel coronary artery disease.  POSTOPERATIVE DIAGNOSIS:  Severe multivessel coronary artery disease.  OPERATIVE PROCEDURE:  Median sternotomy, extracorporeal circulation, coronary artery bypass graft surgery x5 using a left internal mammary artery graft to the left anterior descending coronary artery, with a saphenous vein graft to the diagonal branch of the LAD, a saphenous vein graft to the obtuse marginal branch of the left circumflex coronary artery, and a sequential saphenous vein graft to the posterior descending and posterolateral branches of the right coronary artery. Endoscopic vein harvesting from both sides.  ATTENDING SURGEON:  Evelene Croon, M.D.  ASSISTANT:  Pauline Good, PA-C.  ANESTHESIA:  General endotracheal.  CLINICAL HISTORY:  This patient is a 77 year old woman with end-stage renal disease due to interstitial nephritis on hemodialysis since February 2013.  She recently began having substernal chest pressure and hypertension on hemodialysis.  She has also had some episodes of chest heaviness at home while doing housework.  She was started on Imdur and has not had any further chest discomfort.  She had an abnormal nuclear perfusion scan on August 05, 2012, and therefore underwent cardiac catheterization showing severe multivessel coronary artery disease with ejection fraction of 60%.  The left main was normal.  The LAD had a 30% to 40% ostial stenosis and then a 90% stenosis after the diagonal branch.  Superior branch of the diagonal vessel that had subtotal occlusion with TIMI II flow.  The left circumflex had  80% first marginal stenosis.  The right coronary artery had 70% to 80% proximal and 50% mid vessel stenosis as well as an 80% posterior descending stenosis at the ostium.  After review of the catheterization, on examination of the patient, it was felt that coronary artery bypass graft surgery is the best treatment to prevent further ischemia and infarction and improve her quality of life.  We obtained preoperative Doppler examination, which showed 40% to 59% proximal left internal carotid artery stenosis. There was bilateral antegrade vertebral artery flow.  The brachial, radial, and ulnar waveforms were all triphasic bilaterally.  I discussed the operative procedure with the patient including alternatives, benefits, and risks including, but not limited to bleeding, blood transfusion, infection, stroke, myocardial infarction, graft failure, organ dysfunction, and death.  She understood all of this and agreed to proceed.  OPERATIVE PROCEDURE:  The patient was taken to the operating room and placed on table in supine position.  After induction of general endotracheal anesthesia, a Foley catheter was placed in the bladder using sterile technique.  Then, the chest, abdomen, and both lower extremities were prepped and draped in usual sterile manner.  The chest was entered through a median sternotomy incision and the pericardium opened in midline.  Examination of the heart showed good ventricular contractility.  There was a large amount of epicardial fat, which was surprising for this  thin woman.  The ascending aorta was of normal size and had no palpable plaques in it.  Then, the left internal mammary artery was harvested from the chest wall as a pedicle graft.  This was a small to medium-caliber vessel with good blood flow through it.  At the same time, segment of greater saphenous vein was harvested from the right thigh using endoscopic vein harvest technique.  Below the knee, this vein  became small and therefore another section of vein was harvested from the left thigh using endoscopic vein harvest technique.  This vein was of medium size and good quality.  Then the patient was heparinized and when an adequate ACT was obtained, the distal ascending aorta was cannulated using a 20-French aortic cannula for arterial inflow.  Venous outflow was achieved using a two- stage venous cannula for the right atrial appendage.  An antegrade cardioplegia and vent cannula was inserted in aortic root.  The patient was placed on cardiopulmonary bypass.  The distal coronary artery was identified.  The LAD was a large graftable vessel.  The medial branch of the diagonal vessel was a moderate-size graftable vessel, but had no distal disease in it.  The obtuse marginal was a small to medium-sized vessel.  It was suitable for grafting with no distal disease.  The posterior descending branch was small, but graftable and the posterolateral branch was a large vessel with no disease in it.  Then, the aorta was crossclamped and 800 mL of cold blood antegrade cardioplegia was administered in the aortic root with quick arrest of the heart.  Systemic hypothermia to 32 degrees centigrade and topical hypothermia with iced saline was used.  A temperature probe was placed in septum and insulating pad in the pericardium.  The first distal anastomosis was performed to the posterior descending coronary artery.  The internal diameter of this vessel was 1.5 mm.  The conduit used was a segment of greater saphenous vein and the anastomosis performed in a sequential side-to-side manner using continuous 7-0 Prolene suture.  Flow was noted through the graft and was excellent.  The second distal anastomosis was performed to the posterolateral branch.  The internal diameter of this vessel was 1.75 mm.  The conduit used was a same segment of greater saphenous vein and the anastomosis performed in a sequential  end-to-side manner using continuous 7-0 Prolene suture.  Flow was noted through the graft and was excellent. Then, another dose of cardioplegia was given down vein grafts and aortic root.  The third distal anastomosis was performed to the obtuse marginal branch.  The internal diameter was about 1.5 mm.  The conduit used was a second segment of greater saphenous vein and the anastomosis performed in an end-to-side manner using continuous 7-0 Prolene suture.  Flow was noted through the graft and was excellent.  The fourth distal anastomosis was performed to the diagonal branch.  The internal diameter of this vessel was 1.6 mm.  The conduit used was a third segment of greater saphenous vein and the anastomosis performed in an end-to-side manner using continuous 7-0 Prolene suture.  Flow was noted through the graft and was excellent.  Then, another dose of cardioplegia was given down the vein grafts and aortic root.  The fifth distal anastomosis was performed to the mid LAD.  The internal diameter was about 2 mm.  The conduit used was the left internal mammary graft, this was brought through an opening left pericardium anterior to the phrenic nerve.  It was  anastomosed to the LAD in end-to-side manner using continuous 8-0 Prolene suture.  The pedicle was sutured to the epicardium with 6-0 Prolene sutures.  The patient was then rewarmed to 37 degrees centigrade.  With crossclamp in place, the 3 proximal vein graft anastomoses were performed to the mid ascending aorta in end-to- side manner using continuous 6-0 Prolene suture.  Then, the clamp was removed from mammary pedicle.  There was rapid warming of the ventricular septum and return of spontaneous ventricular fibrillation. Crossclamp was removed with time of 94 minutes and the patient was cardioverted to sinus rhythm.  The proximal and distal anastomoses appeared hemostatic and allowed the grafts satisfactory.  Graft marker was placed  around the proximal anastomoses.  Two temporary right ventricular and right atrial pacing wires were placed and brought out through the skin.  When the patient had rewarmed to 37 degrees centigrade, she was weaned from cardiopulmonary bypass on no inotropic agents.  Total bypass time was 113 minutes.  Cardiac function appeared excellent with cardiac output of 5 L per minute.  Protamine was given and venous and aortic cannulas were removed without difficulty.  Hemostasis was achieved.  The patient was given 1 unit of platelets due to platelet count of 90,000. Then, 3 chest tubes were placed with a tube in the posterior pericardium, one in the left pleural space and one in the anterior mediastinum.  The sternum was reapproximated with #6 stainless steel wires.  The fascia was closed with continuous #1 Vicryl suture. Subcutaneous tissue was closed with continuous 2-0 Vicryl and the skin with a 3-0 Vicryl subcuticular closure.  The lower extremity vein harvest site was closed in layers in similar manner.  The sponge, needle, and instrument counts were correct according to the scrub nurse. Dry sterile dressing was applied over the incisions and around the chest tubes, which were hooked to Pleur-Evac suction.  The patient remained hemodynamically stable and was transported to the SICU in guarded, but stable condition.     Evelene Croon, M.D.     BB/MEDQ  D:  08/14/2012  T:  08/15/2012  Job:  [id number]

## 2012-08-15 NOTE — Progress Notes (Signed)
Subjective:  Off vent, in good spirits, post op chest discomfort. No SOB, CXR clear except LLL effusion. Good UOP w/o lasix Objective Vital signs in last 24 hours: Filed Vitals:   08/15/12 0607 08/15/12 0700 08/15/12 0752 08/15/12 0800  BP: 163/71     Pulse: 93 106  101  Temp: 98.6 F (37 C)  97.7 F (36.5 C)   TempSrc:   Oral   Resp: 18 21  21   Height:      Weight:      SpO2: 100% 100%  100%   Weight change: 2.3 kg (5 lb 1.1 oz)  Intake/Output Summary (Last 24 hours) at 08/15/12 1043 Last data filed at 08/15/12 0800  Gross per 24 hour  Intake 2477.76 ml  Output   5090 ml  Net -2612.24 ml   Labs: Basic Metabolic Panel:  Lab 08/15/12 4696 08/15/12 0335 08/14/12 1820 08/14/12 1300 08/14/12 0445 08/13/12 0707  NA 153* 152* -- 146* -- --  K 4.8 4.0 -- 4.3 -- --  CL 122* 123* -- -- 107 --  CO2 18* 15* -- -- 25 --  GLUCOSE 132* 138* -- 136* -- --  BUN 25* 24* -- -- 19 --  CREATININE 3.76* 3.57* 3.10* -- -- --  CALCIUM 9.2 9.2 -- -- 9.6 --  ALB -- -- -- -- -- --  PHOS -- -- -- -- -- 4.1   Liver Function Tests:  Lab 08/13/12 0707  AST --  ALT --  ALKPHOS --  BILITOT --  PROT --  ALBUMIN 3.6   No results found for this basename: LIPASE:3,AMYLASE:3 in the last 168 hours No results found for this basename: AMMONIA:3 in the last 168 hours CBC:  Lab 08/15/12 0335 08/14/12 1820 08/14/12 1300 08/14/12 0445 08/13/12 0118  WBC 12.1* 11.6* 17.0* -- --  NEUTROABS -- -- -- -- --  HGB 11.0* 10.9* 12.110.9* -- --  HCT 31.8* 31.3* 35.2*32.0* -- --  MCV 88.8 88.9 90.3 91.1 90.3  PLT 105* 106* 127* -- --   Cardiac Enzymes: No results found for this basename: CKTOTAL:5,CKMB:5,CKMBINDEX:5,TROPONINI:5 in the last 168 hours CBG:  Lab 08/15/12 0327 08/15/12 0027 08/14/12 1952 08/13/12 1345 08/12/12 0731  GLUCAP 130* 100* 182* 127* 105*    Iron Studies: No results found for this basename: IRON,TIBC,TRANSFERRIN,FERRITIN in the last 72 hours Studies/Results: Dg Chest Portable  1 View In Am  08/15/2012  *RADIOLOGY REPORT*  Clinical Data: Coronary bypass grafting, intubated  PORTABLE CHEST - 1 VIEW  Comparison:   the previous day's study  Findings: The patient has been extubated and the nasogastric tube removed.  Right IJ Swan-Ganz catheter and left chest tube remain in place.  No pneumothorax evident.  Mild elevation of the left diaphragmatic leaflet as before.  There is patchy atelectasis at the left lung base, stable.  Heart size upper limits normal for technique.  Previous CABG.  IMPRESSION:  1.  Extubation with otherwise stable appearance since previous exam   Original Report Authenticated By: Osa Craver, M.D.    Dg Chest Portable 1 View  08/14/2012  *RADIOLOGY REPORT*  Clinical Data: Status post CABG.  PORTABLE CHEST - 1 VIEW  Comparison: Two-view chest 08/07/2012.  Findings: The patient is status post median sternotomy for CABG. Endotracheal tube terminates 4 cm above the carina, in satisfactory position.  The heart size is normal.  A Swan-Ganz catheter enters via a right IJ sheath.  The tip is in the main pulmonary outflow tract.  A left-sided  chest tube in mediastinal drains are in place. A small left pleural effusion is evident.  There is no significant pneumothorax.  IMPRESSION:  1.  Status post median sternotomy for CABG. 2.  Small left pleural effusion. 3.  Support apparatus as above.   Original Report Authenticated By: Jamesetta Orleans. MATTERN, M.D.    Medications:    . sodium chloride 500 mL (08/14/12 1315)  . sodium chloride Stopped (08/14/12 1315)  . sodium chloride    . insulin (NOVOLIN-R) infusion 0.7 Units/hr (08/14/12 1315)  . lactated ringers 1,000 mL (08/14/12 1330)  . nitroGLYCERIN Stopped (08/14/12 1800)  . phenylephrine (NEO-SYNEPHRINE) Adult infusion Stopped (08/14/12 1315)  . DISCONTD: sodium chloride    . DISCONTD: dexmedetomidine Stopped (08/14/12 1400)  . DISCONTD: heparin 800 Units/hr (08/13/12 1900)      . acetaminophen   1,000 mg Intravenous Once  . acetaminophen  1,000 mg Oral Q6H   Or  . acetaminophen (TYLENOL) oral liquid 160 mg/5 mL  975 mg Per Tube Q6H  . aspirin EC  325 mg Oral Daily   Or  . aspirin  324 mg Per Tube Daily  . bisacodyl  10 mg Oral Daily   Or  . bisacodyl  10 mg Rectal Daily  . cefUROXime (ZINACEF)  IV  1.5 g Intravenous To OR  . cefUROXime (ZINACEF)  IV  750 mg Intravenous Q12H  . clorazepate  3.75 mg Oral QHS  . darbepoetin (ARANESP) injection - DIALYSIS  60 mcg Intravenous Q Thu-HD  . docusate sodium  200 mg Oral Daily  . famotidine (PEPCID) IV  20 mg Intravenous Q12H  . gabapentin  200 mg Oral QHS  . hydrOXYzine  50 mg Oral QHS  . insulin aspart  0-24 Units Subcutaneous Q4H  . insulin aspart  0-24 Units Subcutaneous Q4H  . insulin regular  0-10 Units Intravenous TID WC  . metoprolol tartrate  25 mg Oral BID   Or  . metoprolol tartrate  12.5 mg Per Tube BID  . midazolam      . midazolam  1 mg Intravenous Once  . multivitamin  1 tablet Oral QHS  . nitroGLYCERIN  2-200 mcg/min Intravenous To OR  . pantoprazole  40 mg Oral Daily  . paricalcitol  1 mcg Intravenous Q T,Th,Sa-HD  . potassium chloride  10 mEq Intravenous Q1 Hr x 3  . sevelamer  800 mg Oral Q breakfast  . sodium chloride  10-40 mL Intracatheter Q12H  . sodium chloride  3 mL Intravenous Q12H  . vancomycin  1,000 mg Intravenous Once  . DISCONTD: cefUROXime (ZINACEF)  IV  1.5 g Intravenous Q12H  . DISCONTD: cefUROXime (ZINACEF)  IV  750 mg Intravenous To OR  . DISCONTD: cefUROXime  750 mg Intramuscular Q12H  . DISCONTD: chlorhexidine  60 mL Topical Once  . DISCONTD: DOPamine  2-20 mcg/kg/min Intravenous To OR  . DISCONTD: epinephrine  0.5-20 mcg/min Intravenous To OR  . DISCONTD: isosorbide mononitrate  30 mg Oral Daily  . DISCONTD: magnesium sulfate  40 mEq Other To OR  . DISCONTD: magnesium sulfate  4 g Intravenous Once  . DISCONTD: metoprolol tartrate  12.5 mg Per Tube BID  . DISCONTD: metoprolol tartrate   12.5 mg Oral BID  . DISCONTD: metoprolol tartrate  25 mg Oral BID  . DISCONTD: potassium chloride  80 mEq Other To OR   I  have reviewed scheduled and prn medications.  Physical Exam: General: off vent, alert, talkative Heart: RRR, no rub or murmur,  Sternal dressing dry and clean Lungs: CTA Abdomen: bs SL decr. soft Extremities: Dialysis Access: no pedal edema, surgical dressing dry bilat. Lower legs/  Pos. Bruit  L U A AVF  Dialysis Orders: Center: [facility] on TTS .Optilflux 180  EDW 52HD Bath 2K 2.25 Ca Time 3.5 Heparin 5000. Access left upper AVGG BFR 400 DFR a 1.5 Zemplar 1 mcg IV/HD Epogen none Venofer 100 q 2 weeks Other Hgb 11.5 10/17, iPTH 163 10/17 ferritin 746 36 % sat 10/17  Assessment/Plan:  1. S/P CABG x 5 (08/14/12)- pod #1, stable off vent 2. Hypernatremia- water deficit of 2.9 L, will start D5W at 60cc/hr 3. CKD- usual hd tts. Hold dialysis this weekend, good UOP and no vol excess or K problems so far.  4. Htn/volume- 1.5kg over dry weight today. On Metoprolol 12.5 mg bid po. No vol excess by xray or exam. 5. Anemia - on q 2 week IV Fe, will dose x 1 q weekly in hospital. Giving Aranesp in place of outpt EPO.  6. Metabolic bone disease- cont binder w meals, no vit D 7. Peripheral neuropathy - continue neurontin 200mg  hs 8. Hx bipolar d/o - takes Tranxene 3.75 mg at night, "every night" per pt. Watch for signs of w/d.  Vinson Moselle  MD [facility] [phone] pgr    [phone] cell 08/15/2012, 10:53 AM  '

## 2012-08-15 NOTE — Progress Notes (Signed)
Pt fully awake, able to (+)FC and lift head off bed; ventilator weaning procedure started. Will cont' to monitor and assess.

## 2012-08-15 NOTE — Progress Notes (Signed)
Contacted Dr Arlean Hopping per Dr Laneta Simmers to advise him of Pt's afternoon lab results; Pt status updated and orders received.

## 2012-08-15 NOTE — Progress Notes (Signed)
Pt awoke extremely agitated, attempting to pull out ETT or tongue it out; re-assurence provided without success, attempt to verbally de-escalate Pt unsuccessful, Pt given Versed 1mg  IV prn and will attemt to proceed with extubation at a later time.

## 2012-08-15 NOTE — Progress Notes (Signed)
NIF and VC taken for extubation.  NIF  -40 VC  1.1L

## 2012-08-15 NOTE — Plan of Care (Signed)
Problem: Phase II Progression Outcomes Goal: Patient extubated within - Outcome: Completed/Met Date Met:  08/15/12 Pt extubated @ 0555 08-15-12, 17hrs post-op to 4L [place]

## 2012-08-15 NOTE — Progress Notes (Signed)
Patient ID: Diane [name], female   DOB: 1935-08-12, 77 y.o.   MRN: [id number]   SICU Evening rounds:  Hemodynamically stable  Excellent urine output  BMET    Component Value Date/Time   NA 155* 08/15/2012 1643   K 5.0 08/15/2012 1643   CL 125* 08/15/2012 1643   CO2 18* 08/15/2012 0905   GLUCOSE 140* 08/15/2012 1643   BUN 29* 08/15/2012 1643   CREATININE 3.70* 08/15/2012 1643   CALCIUM 9.2 08/15/2012 0905   GFRNONAA 10* 08/15/2012 1640   GFRAA 11* 08/15/2012 1640   CBC    Component Value Date/Time   WBC 13.1* 08/15/2012 1640   RBC 3.49* 08/15/2012 1640   HGB 10.2* 08/15/2012 1643   HCT 30.0* 08/15/2012 1643   PLT 97* 08/15/2012 1640   MCV 94.3 08/15/2012 1640   [name] 31.5 08/15/2012 1640   MCHC 33.4 08/15/2012 1640   RDW 15.8* 08/15/2012 1640      On D5W at 60cc/hr due to hypernatremia.

## 2012-08-15 NOTE — Progress Notes (Signed)
[facility] Progress Note  Subjective:  Alert, no cp or SOB  Objective:   Vital Signs in the last 24 hours: Temp:  [95.5 F (35.3 C)-100 F (37.8 C)] 97.7 F (36.5 C) (10/26 0752) Pulse Rate:  [68-106] 101  (10/26 0800) Resp:  [12-27] 21  (10/26 0800) BP: (123-163)/(58-71) 163/71 mmHg (10/26 0607) SpO2:  [95 %-100 %] 100 % (10/26 0800) Arterial Line BP: (99-165)/(48-78) 133/59 mmHg (10/26 0800) FiO2 (%):  [40 %-67.9 %] 67.9 % (10/26 0553) Weight:  [53.4 kg (117 lb 11.6 oz)] 53.4 kg (117 lb 11.6 oz) (10/26 0600)  Intake/Output from previous day: 10/25 0701 - 10/26 0700 In: 3337.8 [I.V.:2464.8; Blood:823; IV Piggyback:50] Out: 4965 [Urine:3140; Blood:1555; Chest Tube:270]  Scheduled:   . acetaminophen  1,000 mg Intravenous Once  . acetaminophen  1,000 mg Oral Q6H   Or  . acetaminophen (TYLENOL) oral liquid 160 mg/5 mL  975 mg Per Tube Q6H  . aspirin EC  325 mg Oral Daily   Or  . aspirin  324 mg Per Tube Daily  . bisacodyl  10 mg Oral Daily   Or  . bisacodyl  10 mg Rectal Daily  . cefUROXime (ZINACEF)  IV  1.5 g Intravenous To OR  . cefUROXime (ZINACEF)  IV  750 mg Intravenous Q12H  . clorazepate  3.75 mg Oral QHS  . darbepoetin (ARANESP) injection - DIALYSIS  60 mcg Intravenous Q Thu-HD  . docusate sodium  200 mg Oral Daily  . famotidine (PEPCID) IV  20 mg Intravenous Q12H  . gabapentin  200 mg Oral QHS  . hydrOXYzine  50 mg Oral QHS  . insulin aspart  0-24 Units Subcutaneous Q4H  . insulin aspart  0-24 Units Subcutaneous Q4H  . insulin regular  0-10 Units Intravenous TID WC  . metoprolol tartrate  25 mg Oral BID   Or  . metoprolol tartrate  12.5 mg Per Tube BID  . midazolam      . midazolam  1 mg Intravenous Once  . multivitamin  1 tablet Oral QHS  . nitroGLYCERIN  2-200 mcg/min Intravenous To OR  . pantoprazole  40 mg Oral Daily  . paricalcitol  1 mcg Intravenous Q T,Th,Sa-HD  . potassium chloride  10 mEq Intravenous Q1 Hr x 3    . sevelamer  800 mg Oral Q breakfast  . sodium chloride  10-40 mL Intracatheter Q12H  . sodium chloride  3 mL Intravenous Q12H  . vancomycin  1,000 mg Intravenous Once  . DISCONTD: cefUROXime (ZINACEF)  IV  1.5 g Intravenous Q12H  . DISCONTD: cefUROXime (ZINACEF)  IV  750 mg Intravenous To OR  . DISCONTD: cefUROXime  750 mg Intramuscular Q12H  . DISCONTD: chlorhexidine  60 mL Topical Once  . DISCONTD: DOPamine  2-20 mcg/kg/min Intravenous To OR  . DISCONTD: epinephrine  0.5-20 mcg/min Intravenous To OR  . DISCONTD: isosorbide mononitrate  30 mg Oral Daily  . DISCONTD: magnesium sulfate  40 mEq Other To OR  . DISCONTD: magnesium sulfate  4 g Intravenous Once  . DISCONTD: metoprolol tartrate  12.5 mg Per Tube BID  . DISCONTD: metoprolol tartrate  12.5 mg Oral BID  . DISCONTD: metoprolol tartrate  25 mg Oral BID  . DISCONTD: potassium chloride  80 mEq Other To OR    Physical Exam:   Alert, NAD No JVD Decreased BS, no wheezing RRR 1/6 SEM, soft rub BS +, abd soft No sig edema    Rate: 100  Rhythm: sinus tachycardia  Lab Results:    Basename 08/15/12 0905 08/15/12 0335  NA 153* 152*  K 4.8 4.0  CL 122* 123*  CO2 18* 15*  GLUCOSE 132* 138*  BUN 25* 24*  CREATININE 3.76* 3.57*   No results found for this basename: TROPONINI:2,CK,MB:2 in the last 72 hours Hepatic Function Panel  Basename 08/13/12 0707  PROT --  ALBUMIN 3.6  AST --  ALT --  ALKPHOS --  BILITOT --  BILIDIR --  IBILI --    Basename 08/14/12 1300  INR 1.30    Lipid Panel  No results found for this basename: chol, trig, hdl, cholhdl, vldl, ldlcalc     Imaging:  Dg Chest Portable 1 View In Am  08/15/2012  *RADIOLOGY REPORT*  Clinical Data: Coronary bypass grafting, intubated  PORTABLE CHEST - 1 VIEW  Comparison:   the previous day's study  Findings: The patient has been extubated and the nasogastric tube removed.  Right IJ Swan-Ganz catheter and left chest tube remain in place.  No  pneumothorax evident.  Mild elevation of the left diaphragmatic leaflet as before.  There is patchy atelectasis at the left lung base, stable.  Heart size upper limits normal for technique.  Previous CABG.  IMPRESSION:  1.  Extubation with otherwise stable appearance since previous exam   Original Report Authenticated By: Osa Craver, M.D.    Dg Chest Portable 1 View  08/14/2012  *RADIOLOGY REPORT*  Clinical Data: Status post CABG.  PORTABLE CHEST - 1 VIEW  Comparison: Two-view chest 08/07/2012.  Findings: The patient is status post median sternotomy for CABG. Endotracheal tube terminates 4 cm above the carina, in satisfactory position.  The heart size is normal.  A Swan-Ganz catheter enters via a right IJ sheath.  The tip is in the main pulmonary outflow tract.  A left-sided chest tube in mediastinal drains are in place. A small left pleural effusion is evident.  There is no significant pneumothorax.  IMPRESSION:  1.  Status post median sternotomy for CABG. 2.  Small left pleural effusion. 3.  Support apparatus as above.   Original Report Authenticated By: Jamesetta Orleans. MATTERN, M.D.       Assessment/Plan:   Principal Problem:  *CAD (coronary artery disease): Severe three vessel Active Problems:  ESRD (end stage renal disease) on dialysis  Knee pain, acute, right  Day 1 s/p CABG.  Doing well s/p CABGx5. F/u BMET pending. No dialysis this weekend per renal. Stable hemodynamics.    Lennette Bihari, MD, [facility] 08/15/2012, 9:56 AM

## 2012-08-15 NOTE — Progress Notes (Signed)
Pt awake and calm, able to follow simple commands for grip and moving feet, however unable to lift head off pillow or maintain eye contact. Will attempt to proceed with extubation at a later time.

## 2012-08-15 NOTE — Progress Notes (Signed)
Pt with c/o dysuria while urinating; c/o "it's burning down there when I go", RN aware and will pass on to O.R. Staff and admitting RN to be addressed with surgeon.

## 2012-08-15 NOTE — Progress Notes (Addendum)
1 Day Post-Op Procedure(s) (LRB): CORONARY ARTERY BYPASS GRAFTING (CABG) (N/A) ENDOVEIN HARVEST OF GREATER SAPHENOUS VEIN (Bilateral) Subjective: A little sore but otherwise no problems  Objective: Vital signs in last 24 hours: Temp:  [95.5 F (35.3 C)-100 F (37.8 C)] 97.7 F (36.5 C) (10/26 0752) Pulse Rate:  [68-106] 101  (10/26 0800) Cardiac Rhythm:  [-] Sinus tachycardia (10/26 0813) Resp:  [12-27] 21  (10/26 0800) BP: (123-163)/(58-71) 163/71 mmHg (10/26 0607) SpO2:  [95 %-100 %] 100 % (10/26 0800) Arterial Line BP: (99-165)/(48-78) 133/59 mmHg (10/26 0800) FiO2 (%):  [40 %-67.9 %] 67.9 % (10/26 0553) Weight:  [53.4 kg (117 lb 11.6 oz)] 53.4 kg (117 lb 11.6 oz) (10/26 0600)  Hemodynamic parameters for last 24 hours: PAP: (21-34)/(11-18) 27/12 mmHg CO:  [2.1 L/min-3.7 L/min] 3.3 L/min CI:  [1.4 L/min/m2-2.5 L/min/m2] 2.2 L/min/m2  Intake/Output from previous day: 10/25 0701 - 10/26 0700 In: 3337.8 [I.V.:2464.8; Blood:823; IV Piggyback:50] Out: 4965 [Urine:3140; Blood:1555; Chest Tube:270] Intake/Output this shift: Total I/O In: 40 [I.V.:40] Out: 350 [Urine:350]  General appearance: alert and cooperative Neurologic: intact Heart: regular rate and rhythm, S1, S2 normal, no murmur, click, rub or gallop Lungs: clear to auscultation bilaterally Extremities: extremities normal, atraumatic, no cyanosis or edema Wound: dressing dry  Lab Results:  Basename 08/15/12 0335 08/14/12 1820  WBC 12.1* 11.6*  HGB 11.0* 10.9*  HCT 31.8* 31.3*  PLT 105* 106*   BMET:  Basename 08/15/12 0335 08/14/12 1820 08/14/12 1300 08/14/12 0445  NA 152* -- 146* --  K 4.0 -- 4.3 --  CL 123* -- -- 107  CO2 15* -- -- 25  GLUCOSE 138* -- 136* --  BUN 24* -- -- 19  CREATININE 3.57* 3.10* -- --  CALCIUM 9.2 -- -- 9.6    PT/INR:  Basename 08/14/12 1300  LABPROT 15.9*  INR 1.30   ABG    Component Value Date/Time   PHART 7.355 08/15/2012 0540   HCO3 17.9* 08/15/2012 0540   TCO2 19  08/15/2012 0540   ACIDBASEDEF 7.0* 08/15/2012 0540   O2SAT 99.0 08/15/2012 0540   CBG (last 3)   Basename 08/15/12 0327 08/15/12 0027 08/14/12 1952  GLUCAP 130* 100* 182*   CXR:  Mild left base atelectasis  ECG:  NSR, nonspecific changes.  Assessment/Plan: S/P Procedure(s) (LRB): CORONARY ARTERY BYPASS GRAFTING (CABG) (N/A) ENDOVEIN HARVEST OF GREATER SAPHENOUS VEIN (Bilateral) Mobilize Diabetes control d/c tubes/lines Continue foley due to patient in ICU See progression orders BMET values seem odd this am so will repeat this. HD per nephrology.   LOS: 3 days    Johany Hansman K 08/15/2012

## 2012-08-15 NOTE — Procedures (Signed)
Extubation Procedure Note  Patient Details:   Name: CAMYA HAYDON DOB: November 10, 1934 MRN: [id number]   Airway Documentation:  Airway 7.5 mm (Active)  Secured at (cm) 20 cm 08/15/2012  3:20 AM  Measured From Lips 08/15/2012  3:20 AM  Secured Location Right 08/15/2012  3:20 AM  Secured By Pink Tape 08/15/2012  3:20 AM  Site Condition Dry 08/15/2012  3:20 AM    Evaluation  O2 sats: stable throughout Complications: No apparent complications Patient did tolerate procedure well. Bilateral Breath Sounds: Clear;Diminished Suctioning: Airway Yes   NIF and vital capacity measurements taken prior to extubation.  Patient produced a NIF of -40 and vital capacity of 1.1L.  Deep oral suction and ETT suction also performed prior to extubation.  Patient extubated to 3.5LNC.  Patient vocalizing and no stridor noted in cord region.  RT will monitor. Malachi Paradise 08/15/2012, 6:01 AM

## 2012-08-16 ENCOUNTER — Inpatient Hospital Stay (HOSPITAL_COMMUNITY): Payer: Medicare Other

## 2012-08-16 LAB — BASIC METABOLIC PANEL
CO2: 18 mEq/L — ABNORMAL LOW (ref 19–32)
Chloride: 116 mEq/L — ABNORMAL HIGH (ref 96–112)
Glucose, Bld: 134 mg/dL — ABNORMAL HIGH (ref 70–99)
Potassium: 4.8 mEq/L (ref 3.5–5.1)
Sodium: 147 mEq/L — ABNORMAL HIGH (ref 135–145)

## 2012-08-16 LAB — CBC
HCT: 30.2 % — ABNORMAL LOW (ref 36.0–46.0)
Hemoglobin: 9.8 g/dL — ABNORMAL LOW (ref 12.0–15.0)
MCV: 94.7 fL (ref 78.0–100.0)
Platelets: 87 10*3/uL — ABNORMAL LOW (ref 150–400)
RBC: 3.19 MIL/uL — ABNORMAL LOW (ref 3.87–5.11)
WBC: 12.4 10*3/uL — ABNORMAL HIGH (ref 4.0–10.5)

## 2012-08-16 LAB — GLUCOSE, CAPILLARY
Glucose-Capillary: 114 mg/dL — ABNORMAL HIGH (ref 70–99)
Glucose-Capillary: 120 mg/dL — ABNORMAL HIGH (ref 70–99)
Glucose-Capillary: 136 mg/dL — ABNORMAL HIGH (ref 70–99)
Glucose-Capillary: 93 mg/dL (ref 70–99)

## 2012-08-16 MED ORDER — INSULIN ASPART 100 UNIT/ML ~~LOC~~ SOLN
0.0000 [IU] | Freq: Three times a day (TID) | SUBCUTANEOUS | Status: DC
  Administered 2012-08-16 – 2012-08-17 (×2): 2 [IU] via SUBCUTANEOUS

## 2012-08-16 MED ORDER — INSULIN ASPART 100 UNIT/ML ~~LOC~~ SOLN: 0.0000 [IU] | SUBCUTANEOUS | Status: DC

## 2012-08-16 NOTE — Progress Notes (Signed)
Subjective:  Off vent, no complaints. No sob.   Objective Vital signs in last 24 hours: Filed Vitals:   08/16/12 1000 08/16/12 1100 08/16/12 1200 08/16/12 1232  BP: 105/51 104/44 120/53   Pulse: 105 89 94   Temp:    97.2 F (36.2 C)  TempSrc:    Axillary  Resp: 14 13 21    Height:      Weight:      SpO2: 97% 96% 96%    Weight change: 0.8 kg (1 lb 12.2 oz)  Intake/Output Summary (Last 24 hours) at 08/16/12 1242 Last data filed at 08/16/12 1200  Gross per 24 hour  Intake   2150 ml  Output   2235 ml  Net    -85 ml   Labs: Basic Metabolic Panel:  Lab 08/16/12 5284 08/15/12 1643 08/15/12 1640 08/15/12 0905 08/15/12 0335 08/13/12 0707  NA 147* 155* -- 153* -- --  K 4.8 5.0 -- 4.8 -- --  CL 116* 125* -- 122* -- --  CO2 18* -- -- 18* 15* --  GLUCOSE 134* 140* -- 132* -- --  BUN 35* 29* -- 25* -- --  CREATININE 4.27* 3.70* 4.05* -- -- --  CALCIUM 8.8 -- -- 9.2 9.2 --  ALB -- -- -- -- -- --  PHOS -- -- -- -- -- 4.1   Liver Function Tests:  Lab 08/13/12 0707  AST --  ALT --  ALKPHOS --  BILITOT --  PROT --  ALBUMIN 3.6   No results found for this basename: LIPASE:3,AMYLASE:3 in the last 168 hours No results found for this basename: AMMONIA:3 in the last 168 hours CBC:  Lab 08/16/12 0350 08/15/12 1643 08/15/12 1640 08/15/12 0335 08/14/12 1820 08/14/12 1300  WBC 12.4* -- 13.1* 12.1* -- --  NEUTROABS -- -- -- -- -- --  HGB 9.8* 10.2* 11.0* -- -- --  HCT 30.2* 30.0* 32.9* -- -- --  MCV 94.7 -- 94.3 88.8 88.9 90.3  PLT 87* -- 97* 105* -- --   Cardiac Enzymes: No results found for this basename: CKTOTAL:5,CKMB:5,CKMBINDEX:5,TROPONINI:5 in the last 168 hours CBG:  Lab 08/16/12 0746 08/16/12 0353 08/16/12 0017 08/15/12 1937 08/15/12 1637  GLUCAP 86 120* 93 181* 125*   I  have reviewed scheduled and prn medications.  Physical Exam: General: up in chair, alert, talkative Heart: RRR, no rub or murmur, sternal dressing dry and clean Lungs: CTA Abdomen: bs SL decr.  soft Extremities: Dialysis Access: no pedal edema, surgical dressing dry bilat. Lower legs/  Pos. Bruit  L U A AVF  Dialysis Orders: Center: [facility] on TTS .Optilflux 180  EDW 52HD Bath 2K 2.25 Ca Time 3.5 Heparin 5000. Access left upper AVGG BFR 400 DFR a 1.5 Zemplar 1 mcg IV/HD Epogen none Venofer 100 q 2 weeks Other Hgb 11.5 10/17, iPTH 163 10/17 ferritin 746 36 % sat 10/17  Assessment/Plan:  1. S/P CABG x 5 (08/14/12)- pod #2, stable off vent 2. Hypernatremia- improving on D5W, continue 3. CKD- usual hd tts. Plan dialysis tomorrow, minimal UF, no hep.  4. Htn/volume- 2 kg over dry weight today. On Metoprolol 12.5 mg bid po. No vol excess by xray or exam. 5. Anemia - on q 2 week IV Fe, will dose x 1 q weekly in hospital. Giving Aranesp in place of outpt EPO.  6. Metabolic bone disease- cont binder w meals, no vit D 7. Peripheral neuropathy - continue neurontin 200mg  hs 8. Hx bipolar d/o - takes Tranxene 3.75 mg  at night, "every night" per pt. Watch for signs of w/d.  Vinson Moselle  MD [facility] [phone] pgr    [phone] cell 08/16/2012, 12:42 PM  '

## 2012-08-16 NOTE — Progress Notes (Signed)
2 Days Post-Op Procedure(s) (LRB): CORONARY ARTERY BYPASS GRAFTING (CABG) (N/A) ENDOVEIN HARVEST OF GREATER SAPHENOUS VEIN (Bilateral) Subjective: No complaints  Objective: Vital signs in last 24 hours: Temp:  [97.2 F (36.2 C)-98.2 F (36.8 C)] 97.2 F (36.2 C) (10/27 1232) Pulse Rate:  [81-105] 94  (10/27 1200) Cardiac Rhythm:  [-] Normal sinus rhythm (10/27 1200) Resp:  [9-25] 18  (10/27 1300) BP: (86-129)/(38-79) 113/62 mmHg (10/27 1300) SpO2:  [96 %-100 %] 96 % (10/27 1200) Weight:  [54.2 kg (119 lb 7.8 oz)] 54.2 kg (119 lb 7.8 oz) (10/27 0636)  Hemodynamic parameters for last 24 hours:    Intake/Output from previous day: 10/26 0701 - 10/27 0700 In: 2000 [P.O.:720; I.V.:1230; IV Piggyback:50] Out: 2575 [Urine:2535; Chest Tube:40] Intake/Output this shift: Total I/O In: 480 [P.O.:120; I.V.:360] Out: 700 [Urine:700]  General appearance: alert and cooperative Neurologic: intact Heart: regular rate and rhythm, S1, S2 normal, no murmur, click, rub or gallop Lungs: diminished breath sounds base - left Extremities: extremities normal, atraumatic, no cyanosis or edema Wound: dressing dry  Lab Results:  Basename 08/16/12 0350 08/15/12 1643 08/15/12 1640  WBC 12.4* -- 13.1*  HGB 9.8* 10.2* --  HCT 30.2* 30.0* --  PLT 87* -- 97*   BMET:  Basename 08/16/12 0350 08/15/12 1643 08/15/12 0905  NA 147* 155* --  K 4.8 5.0 --  CL 116* 125* --  CO2 18* -- 18*  GLUCOSE 134* 140* --  BUN 35* 29* --  CREATININE 4.27* 3.70* --  CALCIUM 8.8 -- 9.2    PT/INR:  Basename 08/14/12 1300  LABPROT 15.9*  INR 1.30   ABG    Component Value Date/Time   PHART 7.355 08/15/2012 0540   HCO3 17.9* 08/15/2012 0540   TCO2 19 08/15/2012 1643   ACIDBASEDEF 7.0* 08/15/2012 0540   O2SAT 99.0 08/15/2012 0540   CBG (last 3)   Basename 08/16/12 1230 08/16/12 0746 08/16/12 0353  GLUCAP 114* 86 120*    Assessment/Plan: S/P Procedure(s) (LRB): CORONARY ARTERY BYPASS GRAFTING (CABG)  (N/A) ENDOVEIN HARVEST OF GREATER SAPHENOUS VEIN (Bilateral) Doing well. Plan HD tomorrow DC foley and central line. Her blood pressure runs on the low side so she may not tolerate BB.   LOS: 4 days    Aul Mangieri K 08/16/2012

## 2012-08-16 NOTE — Progress Notes (Signed)
[facility] Progress Note  Subjective:  Day 2 s/p CABGx5; complains of being "sore."  Objective:   Vital Signs in the last 24 hours: Temp:  [97.9 F (36.6 C)-98.3 F (36.8 C)] 98.2 F (36.8 C) (10/27 0751) Pulse Rate:  [81-105] 88  (10/27 0800) Resp:  [9-25] 12  (10/27 0800) BP: (86-129)/(38-58) 89/44 mmHg (10/27 0800) SpO2:  [97 %-100 %] 100 % (10/27 0800) Weight:  [54.2 kg (119 lb 7.8 oz)] 54.2 kg (119 lb 7.8 oz) (10/27 0636)  Intake/Output from previous day: 10/26 0701 - 10/27 0700 In: 2000 [P.O.:720; I.V.:1230; IV Piggyback:50] Out: 2575 [Urine:2535; Chest Tube:40]  Scheduled:   . acetaminophen  1,000 mg Oral Q6H   Or  . acetaminophen (TYLENOL) oral liquid 160 mg/5 mL  975 mg Per Tube Q6H  . aspirin EC  325 mg Oral Daily   Or  . aspirin  324 mg Per Tube Daily  . bisacodyl  10 mg Oral Daily   Or  . bisacodyl  10 mg Rectal Daily  . cefUROXime (ZINACEF)  IV  750 mg Intravenous Q12H  . clorazepate  3.75 mg Oral QHS  . darbepoetin (ARANESP) injection - DIALYSIS  60 mcg Intravenous Q Thu-HD  . docusate sodium  200 mg Oral Daily  . gabapentin  200 mg Oral QHS  . hydrOXYzine  50 mg Oral QHS  . insulin aspart  0-24 Units Subcutaneous Q4H  . insulin regular  0-10 Units Intravenous TID WC  . metoprolol tartrate  25 mg Oral BID   Or  . metoprolol tartrate  12.5 mg Per Tube BID  . midazolam      . midazolam  1 mg Intravenous Once  . multivitamin  1 tablet Oral QHS  . pantoprazole  40 mg Oral Daily  . paricalcitol  1 mcg Intravenous Q T,Th,Sa-HD  . sevelamer  800 mg Oral Q breakfast  . sodium chloride  10-40 mL Intracatheter Q12H  . sodium chloride  3 mL Intravenous Q12H  . DISCONTD: insulin aspart  0-24 Units Subcutaneous Q4H    Physical Exam:   General appearance: alert, cooperative and no distress Neck: no adenopathy, no carotid bruit and no JVD Lungs: decreased BS at bases, no wheezing Heart: S1, S2 normal, no rub Abdomen:  nontender Extremities: trace edema   Rate: 100  Rhythm: sinus tachycardia  Lab Results:    Basename 08/16/12 0350 08/15/12 1643 08/15/12 0905  NA 147* 155* --  K 4.8 5.0 --  CL 116* 125* --  CO2 18* -- 18*  GLUCOSE 134* 140* --  BUN 35* 29* --  CREATININE 4.27* 3.70* --   No results found for this basename: TROPONINI:2,CK,MB:2 in the last 72 hours Hepatic Function Panel No results found for this basename: PROT,ALBUMIN,AST,ALT,ALKPHOS,BILITOT,BILIDIR,IBILI in the last 72 hours  Basename 08/14/12 1300  INR 1.30    Lipid Panel  No results found for this basename: chol, trig, hdl, cholhdl, vldl, ldlcalc     Imaging:  Dg Chest Portable 1 View In Am  08/16/2012  *RADIOLOGY REPORT*  Clinical Data: Postop cardiac surgery  PORTABLE CHEST - 1 VIEW  Comparison: Chest radiograph 08/11/2012  Findings: Interval retraction of Swan-Ganz catheter and removal of left chest tube.  No pneumothorax.  There is dense left basilar atelectasis again demonstrated.  This is in part due to elevation left hemidiaphragm.  Mediastinal drain has also been removed.  IMPRESSION: 1.  Removal of left chest tube without complication. 2.  Persistent left  basilar atelectasis and elevation of the left hemidiaphragm.   Original Report Authenticated By: Genevive Bi, M.D.    Dg Chest Portable 1 View In Am  08/15/2012  *RADIOLOGY REPORT*  Clinical Data: Coronary bypass grafting, intubated  PORTABLE CHEST - 1 VIEW  Comparison:   the previous day's study  Findings: The patient has been extubated and the nasogastric tube removed.  Right IJ Swan-Ganz catheter and left chest tube remain in place.  No pneumothorax evident.  Mild elevation of the left diaphragmatic leaflet as before.  There is patchy atelectasis at the left lung base, stable.  Heart size upper limits normal for technique.  Previous CABG.  IMPRESSION:  1.  Extubation with otherwise stable appearance since previous exam   Original Report Authenticated By: Osa Craver, M.D.    Dg Chest Portable 1 View  08/14/2012  *RADIOLOGY REPORT*  Clinical Data: Status post CABG.  PORTABLE CHEST - 1 VIEW  Comparison: Two-view chest 08/07/2012.  Findings: The patient is status post median sternotomy for CABG. Endotracheal tube terminates 4 cm above the carina, in satisfactory position.  The heart size is normal.  A Swan-Ganz catheter enters via a right IJ sheath.  The tip is in the main pulmonary outflow tract.  A left-sided chest tube in mediastinal drains are in place. A small left pleural effusion is evident.  There is no significant pneumothorax.  IMPRESSION:  1.  Status post median sternotomy for CABG. 2.  Small left pleural effusion. 3.  Support apparatus as above.   Original Report Authenticated By: Jamesetta Orleans. MATTERN, M.D.       Assessment/Plan:   Principal Problem:  *CAD (coronary artery disease): Severe three vessel Active Problems:  ESRD (end stage renal disease) on dialysis  Knee pain, acute, right  Day 2 s/p CABG. Stable hemodynamics. Left basilar atelectasis with elevation of hemidiaphragm on CXR. Dialysis timing per renal.    Diane Bihari, MD, [facility] 08/16/2012, 9:08 AM

## 2012-08-17 ENCOUNTER — Encounter (HOSPITAL_COMMUNITY): Payer: Self-pay | Admitting: Surgery

## 2012-08-17 ENCOUNTER — Inpatient Hospital Stay (HOSPITAL_COMMUNITY): Payer: Medicare Other

## 2012-08-17 LAB — TYPE AND SCREEN
Antibody Screen: NEGATIVE
Unit division: 0
Unit division: 0

## 2012-08-17 LAB — GLUCOSE, CAPILLARY
Glucose-Capillary: 152 mg/dL — ABNORMAL HIGH (ref 70–99)
Glucose-Capillary: 103 mg/dL — ABNORMAL HIGH (ref 70–99)
Glucose-Capillary: 125 mg/dL — ABNORMAL HIGH (ref 70–99)

## 2012-08-17 LAB — BASIC METABOLIC PANEL
Calcium: 8.9 mg/dL (ref 8.4–10.5)
GFR calc Af Amer: 9 mL/min — ABNORMAL LOW (ref >90)
GFR calc non Af Amer: 7 mL/min — ABNORMAL LOW (ref >90)
Glucose, Bld: 117 mg/dL — ABNORMAL HIGH (ref 70–99)
Potassium: 4.9 mEq/L (ref 3.5–5.1)
Sodium: 144 mEq/L (ref 135–145)

## 2012-08-17 MED ORDER — SODIUM CHLORIDE 0.9 % IJ SOLN: 3.0000 mL | INTRAMUSCULAR | Status: DC | PRN

## 2012-08-17 MED ORDER — ATORVASTATIN CALCIUM 40 MG PO TABS
40.0000 mg | ORAL_TABLET | Freq: Every day | ORAL | Status: DC
  Administered 2012-08-17 – 2012-08-20 (×4): 40 mg via ORAL
  Filled 2012-08-17 (×6): qty 1

## 2012-08-17 MED ORDER — BISACODYL 5 MG PO TBEC
10.0000 mg | DELAYED_RELEASE_TABLET | Freq: Every day | ORAL | Status: DC | PRN
  Filled 2012-08-17: qty 2

## 2012-08-17 MED ORDER — ASPIRIN EC 325 MG PO TBEC
325.0000 mg | DELAYED_RELEASE_TABLET | Freq: Every day | ORAL | Status: DC
  Administered 2012-08-18 – 2012-08-21 (×4): 325 mg via ORAL
  Filled 2012-08-17 (×4): qty 1

## 2012-08-17 MED ORDER — SODIUM CHLORIDE 0.9 % IJ SOLN
3.0000 mL | Freq: Two times a day (BID) | INTRAMUSCULAR | Status: DC
  Administered 2012-08-17 – 2012-08-20 (×6): 3 mL via INTRAVENOUS

## 2012-08-17 MED ORDER — MOVING RIGHT ALONG BOOK
Freq: Once | Status: AC
  Administered 2012-08-17: 22:00:00
  Filled 2012-08-17: qty 1

## 2012-08-17 MED ORDER — DOCUSATE SODIUM 100 MG PO CAPS
200.0000 mg | ORAL_CAPSULE | Freq: Every day | ORAL | Status: DC
  Administered 2012-08-18 – 2012-08-21 (×4): 200 mg via ORAL
  Filled 2012-08-17 (×4): qty 2

## 2012-08-17 MED ORDER — BISACODYL 10 MG RE SUPP: 10.0000 mg | Freq: Every day | RECTAL | Status: DC | PRN

## 2012-08-17 MED ORDER — SODIUM CHLORIDE 0.9 % IV SOLN: 250.0000 mL | INTRAVENOUS | Status: DC | PRN

## 2012-08-17 NOTE — Progress Notes (Signed)
Patient ID: Diane [name], female   DOB: [birth year], 77 y.o.   MRN: [id number]  [name] Progress Note    Subjective:   Feels good except chest is sore   Objective:   BP 104/62  Pulse 108  Temp 97.7 F (36.5 C) (Oral)  Resp 21  Ht 5\' 2"  (1.575 m)  Wt 54.7 kg (120 lb 9.5 oz)  BMI 22.06 kg/m2  SpO2 93%  Physical Exam: Gen:WD WN elderly WF in NAD eating breakfast CVS:RRR no rub Resp:decreased BS at bases NWG:NFAOZH Ext:tr edema, LUE AVG +T/B  Labs: BMET  Lab 08/17/12 0545 08/16/12 0350 08/15/12 1643 08/15/12 1640 08/15/12 0905 08/15/12 0335 08/14/12 1820 08/14/12 1300 08/14/12 0445 08/13/12 0707  NA 144 147* 155* -- 153* 152* 149* 146* -- --  K 4.9 4.8 5.0 -- 4.8 4.0 4.1 4.3 -- --  CL 116* 116* 125* -- 122* 123* 121* -- 107 --  CO2 17* 18* -- -- 18* 15* -- -- 25 20  GLUCOSE 117* 134* 140* -- 132* 138* 133* 136* -- --  BUN 50* 35* 29* -- 25* 24* 19 -- 19 --  CREATININE 5.13* 4.27* 3.70* 4.05* 3.76* 3.57* 3.10*3.10* -- -- --  ALBUMIN -- -- -- -- -- -- -- -- -- 3.6  CALCIUM 8.9 8.8 -- -- 9.2 9.2 -- -- 9.6 9.4  PHOS -- -- -- -- -- -- -- -- -- 4.1   CBC  Lab 08/16/12 0350 08/15/12 1643 08/15/12 1640 08/15/12 0335 08/14/12 1820  WBC 12.4* -- 13.1* 12.1* 11.6*  NEUTROABS -- -- -- -- --  HGB 9.8* 10.2* 11.0* 11.0* --  HCT 30.2* 30.0* 32.9* 31.8* --  MCV 94.7 -- 94.3 88.8 88.9  PLT 87* -- 97* 105* 106*    @IMGRELPRIORS @ Medications:      . acetaminophen  1,000 mg Oral Q6H   Or  . acetaminophen (TYLENOL) oral liquid 160 mg/5 mL  975 mg Per Tube Q6H  . aspirin EC  325 mg Oral Daily   Or  . aspirin  324 mg Per Tube Daily  . bisacodyl  10 mg Oral Daily   Or  . bisacodyl  10 mg Rectal Daily  . clorazepate  3.75 mg Oral QHS  . darbepoetin (ARANESP) injection - DIALYSIS  60 mcg Intravenous Q Thu-HD  . docusate sodium  200 mg Oral Daily  . gabapentin  200 mg Oral QHS  . hydrOXYzine  50 mg Oral QHS  . insulin aspart  0-24 Units Subcutaneous TID  AC & HS  . metoprolol tartrate  25 mg Oral BID   Or  . metoprolol tartrate  12.5 mg Per Tube BID  . multivitamin  1 tablet Oral QHS  . pantoprazole  40 mg Oral Daily  . paricalcitol  1 mcg Intravenous Q T,Th,Sa-HD  . sevelamer  800 mg Oral Q breakfast  . sodium chloride  3 mL Intravenous Q12H  . DISCONTD: insulin aspart  0-24 Units Subcutaneous Q4H  . DISCONTD: insulin aspart  0-24 Units Subcutaneous Q4H  . DISCONTD: insulin regular  0-10 Units Intravenous TID WC  . DISCONTD: sodium chloride  10-40 mL Intracatheter Q12H     Assessment/ Plan:    1. S/P CABG x 5 (08/14/12)- pod #3, stable off vent 2. Hypernatremia- improved with D5W, now off and taking po.  Follow after HD. 3. ESRD/CKD- usual hd tts. Plan dialysis today, minimal UF, no hep.  4. Htn/volume- 2.7 kg over dry weight today. On Metoprolol 12.5 mg  bid po. No vol excess by xray or exam. 5. Anemia - on q 2 week IV Fe, will dose x 1 q weekly in hospital. Giving Aranesp in place of outpt EPO.  6. Metabolic bone disease- cont binder w meals, no vit D 7. Peripheral neuropathy - continue neurontin 200mg  hs 8. Hx bipolar d/o - takes Tranxene 3.75 mg at night, "every night" per pt. Watch for signs of w/d. 9. Dispo- pt reports that she would like written instructions on what she can do to help herself get better because she has trouble remembering things on her own. Sacora Hawbaker A 08/17/2012, 8:57 AM

## 2012-08-17 NOTE — Progress Notes (Signed)
[facility] Progress Note  Subjective:  Day 3 s/p CABG  Objective:   Vital Signs in the last 24 hours: Temp:  [97.2 F (36.2 C)-98.6 F (37 C)] 97.7 F (36.5 C) (10/28 0733) Pulse Rate:  [89-108] 108  (10/28 0800) Resp:  [13-26] 13  (10/28 1000) BP: (90-120)/(40-69) 100/53 mmHg (10/28 1000) SpO2:  [93 %-97 %] 93 % (10/28 0800) Weight:  [54.7 kg (120 lb 9.5 oz)] 54.7 kg (120 lb 9.5 oz) (10/28 0600)  Intake/Output from previous day: 10/27 0701 - 10/28 0700 In: 1400 [P.O.:1040; I.V.:360] Out: 2425 [Urine:2425]  Scheduled:   . acetaminophen  1,000 mg Oral Q6H   Or  . acetaminophen (TYLENOL) oral liquid 160 mg/5 mL  975 mg Per Tube Q6H  . aspirin EC  325 mg Oral Daily   Or  . aspirin  324 mg Per Tube Daily  . bisacodyl  10 mg Oral Daily   Or  . bisacodyl  10 mg Rectal Daily  . clorazepate  3.75 mg Oral QHS  . darbepoetin (ARANESP) injection - DIALYSIS  60 mcg Intravenous Q Thu-HD  . docusate sodium  200 mg Oral Daily  . gabapentin  200 mg Oral QHS  . hydrOXYzine  50 mg Oral QHS  . insulin aspart  0-24 Units Subcutaneous TID AC & HS  . metoprolol tartrate  25 mg Oral BID   Or  . metoprolol tartrate  12.5 mg Per Tube BID  . multivitamin  1 tablet Oral QHS  . pantoprazole  40 mg Oral Daily  . paricalcitol  1 mcg Intravenous Q T,Th,Sa-HD  . sevelamer  800 mg Oral Q breakfast  . sodium chloride  3 mL Intravenous Q12H  . DISCONTD: insulin aspart  0-24 Units Subcutaneous Q4H  . DISCONTD: insulin aspart  0-24 Units Subcutaneous Q4H  . DISCONTD: insulin regular  0-10 Units Intravenous TID WC  . DISCONTD: sodium chloride  10-40 mL Intracatheter Q12H    Physical Exam:  Alert; NAD No JVD Decreased BS, no wheezing RRR at 90 1/6 sem; no rub ABD: soft, BS+ Trivial edema    Rate: 95  Rhythm: normal sinus rhythm  Lab Results:    Basename 08/17/12 0545 08/16/12 0350  NA 144 147*  K 4.9 4.8  CL 116* 116*  CO2 17* 18*  GLUCOSE 117* 134*    BUN 50* 35*  CREATININE 5.13* 4.27*   No results found for this basename: TROPONINI:2,CK,MB:2 in the last 72 hours Hepatic Function Panel No results found for this basename: PROT,ALBUMIN,AST,ALT,ALKPHOS,BILITOT,BILIDIR,IBILI in the last 72 hours  Basename 08/14/12 1300  INR 1.30    Lipid Panel  No results found for this basename: chol, trig, hdl, cholhdl, vldl, ldlcalc     Imaging:  Dg Chest Portable 1 View In Am  08/16/2012  *RADIOLOGY REPORT*  Clinical Data: Postop cardiac surgery  PORTABLE CHEST - 1 VIEW  Comparison: Chest radiograph 08/11/2012  Findings: Interval retraction of Swan-Ganz catheter and removal of left chest tube.  No pneumothorax.  There is dense left basilar atelectasis again demonstrated.  This is in part due to elevation left hemidiaphragm.  Mediastinal drain has also been removed.  IMPRESSION: 1.  Removal of left chest tube without complication. 2.  Persistent left basilar atelectasis and elevation of the left hemidiaphragm.   Original Report Authenticated By: Genevive Bi, M.D.       Assessment/Plan:   Principal Problem:  *CAD (coronary artery disease): Severe three vessel Active Problems:  ESRD (  end stage renal disease) on dialysis  Knee pain, acute, right   Day 3 S/P CABGx5. For dialysis today. Stable hemodynamics.   Lennette Bihari, MD, [facility] 08/17/2012, 10:43 AM

## 2012-08-17 NOTE — Care Management Note (Signed)
    Page 1 of 1   08/21/2012     1:59:13 PM   CARE MANAGEMENT NOTE 08/21/2012  Patient:  Diane [name], Diane [name]   Account Number:  [id number]  Date Initiated:  08/17/2012  Documentation initiated by:  Avie Arenas  Subjective/Objective Assessment:   Thoracotomy on 10-24 -     Action/Plan:   PT REQUESTS SNF FOR ST REHAB AT DC; CSW CONSULTED TO FACILITATE DC TO SNF WHEN MEDICALLY STABLE.   Anticipated DC Date:  08/21/2012   Anticipated DC Plan:  SKILLED NURSING FACILITY  In-house referral  Clinical Social Worker      DC Planning Services  CM consult      Choice offered to / List presented to:             Status of service:  Completed, signed off Medicare Important Message given?   (If response is "NO", the following Medicare IM given date fields will be blank) Date Medicare IM given:   Date Additional Medicare IM given:    Discharge Disposition:  SKILLED NURSING FACILITY  Per UR Regulation:  Reviewed for med. necessity/level of care/duration of stay  If discussed at Long Length of Stay Meetings, dates discussed:    Comments:  Contact:  Beumer,Robert L Spouse [phone]  08/21/12 Angela Platner,RN,BSN [phone] PT DISCHARGED TO SNF TODAY, PER CSW ARRANGEMENTS.  08-17-12 1:40pm Avie Arenas, RNBSN [phone] Extubated but still requiring Venti mask - may need home oxygen.  tx to acute today.

## 2012-08-17 NOTE — Progress Notes (Signed)
3 Days Post-Op Procedure(s) (LRB): CORONARY ARTERY BYPASS GRAFTING (CABG) (N/A) ENDOVEIN HARVEST OF GREATER SAPHENOUS VEIN (Bilateral) Subjective: Sore  Objective: Vital signs in last 24 hours: Temp:  [97.2 F (36.2 C)-98.6 F (37 C)] 97.7 F (36.5 C) (10/28 0733) Pulse Rate:  [89-105] 94  (10/27 1200) Cardiac Rhythm:  [-] Normal sinus rhythm (10/27 1945) Resp:  [13-26] 14  (10/28 0700) BP: (90-120)/(40-79) 91/44 mmHg (10/28 0700) SpO2:  [93 %-99 %] 93 % (10/28 0800) Weight:  [54.7 kg (120 lb 9.5 oz)] 54.7 kg (120 lb 9.5 oz) (10/28 0600)  Hemodynamic parameters for last 24 hours:    Intake/Output from previous day: 10/27 0701 - 10/28 0700 In: 1400 [P.O.:1040; I.V.:360] Out: 2425 [Urine:2425] Intake/Output this shift: Total I/O In: -  Out: 750 [Urine:750]  General appearance: alert and cooperative Neurologic: intact Heart: regular rate and rhythm, S1, S2 normal, no murmur, click, rub or gallop Lungs: clear to auscultation bilaterally Extremities: edema minimal Wound: incisions ok  Lab Results:  Basename 08/16/12 0350 08/15/12 1643 08/15/12 1640  WBC 12.4* -- 13.1*  HGB 9.8* 10.2* --  HCT 30.2* 30.0* --  PLT 87* -- 97*   BMET:  Basename 08/17/12 0545 08/16/12 0350  NA 144 147*  K 4.9 4.8  CL 116* 116*  CO2 17* 18*  GLUCOSE 117* 134*  BUN 50* 35*  CREATININE 5.13* 4.27*  CALCIUM 8.9 8.8    PT/INR:  Basename 08/14/12 1300  LABPROT 15.9*  INR 1.30   ABG    Component Value Date/Time   PHART 7.355 08/15/2012 0540   HCO3 17.9* 08/15/2012 0540   TCO2 19 08/15/2012 1643   ACIDBASEDEF 7.0* 08/15/2012 0540   O2SAT 99.0 08/15/2012 0540   CBG (last 3)   Basename 08/16/12 2157 08/16/12 1628 08/16/12 1230  GLUCAP 136* 152* 114*    Assessment/Plan: S/P Procedure(s) (LRB): CORONARY ARTERY BYPASS GRAFTING (CABG) (N/A) ENDOVEIN HARVEST OF GREATER SAPHENOUS VEIN (Bilateral) Mobilize Plan for transfer to step-down: see transfer orders HD today   LOS: 5  days    Debbie Bellucci K 08/17/2012

## 2012-08-18 ENCOUNTER — Encounter (HOSPITAL_COMMUNITY): Payer: Self-pay | Admitting: Cardiology

## 2012-08-18 DIAGNOSIS — Z951 Presence of aortocoronary bypass graft: Secondary | ICD-10-CM

## 2012-08-18 HISTORY — DX: Presence of aortocoronary bypass graft: Z95.1

## 2012-08-18 LAB — BASIC METABOLIC PANEL
CO2: 26 mEq/L (ref 19–32)
Chloride: 106 mEq/L (ref 96–112)
Sodium: 145 mEq/L (ref 135–145)

## 2012-08-18 LAB — GLUCOSE, CAPILLARY: Glucose-Capillary: 114 mg/dL — ABNORMAL HIGH (ref 70–99)

## 2012-08-18 LAB — CBC
HCT: 31.9 % — ABNORMAL LOW (ref 36.0–46.0)
Hemoglobin: 10.3 g/dL — ABNORMAL LOW (ref 12.0–15.0)
MCV: 92.2 fL (ref 78.0–100.0)
RBC: 3.46 MIL/uL — ABNORMAL LOW (ref 3.87–5.11)
WBC: 9.1 10*3/uL (ref 4.0–10.5)

## 2012-08-18 MED FILL — Mannitol IV Soln 20%: INTRAVENOUS | Qty: 500 | Status: AC

## 2012-08-18 MED FILL — Sodium Bicarbonate IV Soln 8.4%: INTRAVENOUS | Qty: 50 | Status: AC

## 2012-08-18 MED FILL — Lidocaine HCl IV Inj 20 MG/ML: INTRAVENOUS | Qty: 5 | Status: AC

## 2012-08-18 MED FILL — Sodium Chloride Irrigation Soln 0.9%: Qty: 3000 | Status: AC

## 2012-08-18 MED FILL — Heparin Sodium (Porcine) Inj 1000 Unit/ML: INTRAMUSCULAR | Qty: 10 | Status: AC

## 2012-08-18 MED FILL — Sodium Chloride IV Soln 0.9%: INTRAVENOUS | Qty: 2000 | Status: AC

## 2012-08-18 MED FILL — Heparin Sodium (Porcine) Inj 1000 Unit/ML: INTRAMUSCULAR | Qty: 30 | Status: AC

## 2012-08-18 MED FILL — Electrolyte-R (PH 7.4) Solution: INTRAVENOUS | Qty: 2000 | Status: AC

## 2012-08-18 MED FILL — Potassium Chloride Inj 2 mEq/ML: INTRAVENOUS | Qty: 40 | Status: AC

## 2012-08-18 MED FILL — Magnesium Sulfate Inj 50%: INTRAMUSCULAR | Qty: 10 | Status: AC

## 2012-08-18 NOTE — Progress Notes (Addendum)
4 Days Post-Op Procedure(s) (LRB): CORONARY ARTERY BYPASS GRAFTING (CABG) (N/A) ENDOVEIN HARVEST OF GREATER SAPHENOUS VEIN (Bilateral)  Subjective:  Mrs. Dena states she feels a tiny bit better today.  She still has some pain which she expects from surgery.  She is ambulating some. No BM, +Flatus  Objective: Vital signs in last 24 hours: Temp:  [97 F (36.1 C)-98.9 F (37.2 C)] 98.3 F (36.8 C) (10/29 0603) Pulse Rate:  [83-113] 113  (10/29 0603) Cardiac Rhythm:  [-] Sinus tachycardia (10/28 2000) Resp:  [13-21] 16  (10/29 0603) BP: (93-127)/(42-66) 95/63 mmHg (10/29 0603) SpO2:  [93 %-99 %] 97 % (10/29 0603) Weight:  [114 lb 9.6 oz (51.982 kg)-123 lb 7.3 oz (56 kg)] 114 lb 9.6 oz (51.982 kg) (10/29 0603)   Intake/Output from previous day: 10/28 0701 - 10/29 0700 In: 423 [P.O.:420; I.V.:3] Out: 3450 [Urine:3450]  General appearance: alert, cooperative and no distress Heart: regular rate and rhythm Lungs: clear to auscultation bilaterally Abdomen: soft, non-tender; bowel sounds normal; no masses,  no organomegaly Extremities: edema + LE edema mainly in feet Wound: clean and dry  Lab Results:  [facility] 08/18/12 0615 08/16/12 0350  WBC 9.1 12.4*  HGB 10.3* 9.8*  HCT 31.9* 30.2*  PLT 128* 87*   BMET:  Basename 08/17/12 0545 08/16/12 0350  NA 144 147*  K 4.9 4.8  CL 116* 116*  CO2 17* 18*  GLUCOSE 117* 134*  BUN 50* 35*  CREATININE 5.13* 4.27*  CALCIUM 8.9 8.8    PT/INR: No results found for this basename: LABPROT,INR in the last 72 hours ABG    Component Value Date/Time   PHART 7.355 08/15/2012 0540   HCO3 17.9* 08/15/2012 0540   TCO2 19 08/15/2012 1643   ACIDBASEDEF 7.0* 08/15/2012 0540   O2SAT 99.0 08/15/2012 0540   CBG (last 3)   Basename 08/18/12 0629 08/17/12 2120 08/17/12 1701  GLUCAP 114* 125* 79    Assessment/Plan: S/P Procedure(s) (LRB): CORONARY ARTERY BYPASS GRAFTING (CABG) (N/A) ENDOVEIN HARVEST OF GREATER SAPHENOUS VEIN  (Bilateral)  1. CV- NSR, tachy rate 110s, hypotensive-patient was on Lopressor 25mg  BID at home, not on any currently, may not be able to tolerate due to blood pressure 2. Pulm- no acute issues, continue IS 3. Renal- CKD, on HD, creatinine rising up to 5.13- Nephrology following 4. Volume Overload- weight is stable, back to preop 5. Chronic Anemia- on Aranesp 6. Dispo- patient doing well, HD per Nephrology, will get social work consult, patient unsure if her husband will be of any help to her at discharge and she would likely benefit from assistance/SNF placement    LOS: 6 days    Raford Pitcher, ERIN 08/18/2012    Chart reviewed, patient examined, agree with above.

## 2012-08-18 NOTE — Progress Notes (Signed)
08/18/2012 3:48 PM Nursing note Pt. Refused second walk for this evening due to exhaustion from prior walk and visitor presence. Will attempt another walk this evening.  Jamesrobert Ohanesian, Blanchard Kelch

## 2012-08-18 NOTE — Progress Notes (Signed)
Subjective:   Objective: Vital signs in last 24 hours: Temp:  [97 F (36.1 C)-98.9 F (37.2 C)] 98.3 F (36.8 C) (10/29 0603) Pulse Rate:  [83-113] 113  (10/29 0603) Resp:  [13-21] 16  (10/29 0603) BP: (93-127)/(42-66) 95/63 mmHg (10/29 0603) SpO2:  [93 %-99 %] 97 % (10/29 0603) Weight:  [51.982 kg (114 lb 9.6 oz)-56 kg (123 lb 7.3 oz)] 51.982 kg (114 lb 9.6 oz) (10/29 0603) Weight change: 1.3 kg (2 lb 13.9 oz) Last BM Date: 08/14/12 Intake/Output from previous day: -2827  Wt. 51.9Kg  Down from 56 kg 10/28 0701 - 10/29 0700 In: 423 [P.O.:420; I.V.:3] Out: 3450 [Urine:3450] Intake/Output this shift:    PE:   General appearance: alert, cooperative and no distress  Neck: no adenopathy, no carotid bruit and no JVD  Lungs: decreased BS at bases, no wheezing  Heart: S1, S2 normal, no rub  Abdomen: nontender  Extremities: trace edema     Lab Results:  [facility] 08/18/12 0615 08/16/12 0350  WBC 9.1 12.4*  HGB 10.3* 9.8*  HCT 31.9* 30.2*  PLT 128* 87*   BMET  Basename 08/17/12 0545 08/16/12 0350  NA 144 147*  K 4.9 4.8  CL 116* 116*  CO2 17* 18*  GLUCOSE 117* 134*  BUN 50* 35*  CREATININE 5.13* 4.27*  CALCIUM 8.9 8.8     EKG: Orders placed during the hospital encounter of 08/12/12  . EKG 12-LEAD  . EKG 12-LEAD  . EKG 12-LEAD  . EKG 12-LEAD  . EKG 12-LEAD  . EKG 12-LEAD    Studies/Results: 08/15/12 OPERATIVE PROCEDURE: Median sternotomy, extracorporeal circulation,  coronary artery bypass graft surgery x5 using a left internal mammary  artery graft to the left anterior descending coronary artery, with a  saphenous vein graft to the diagonal branch of the LAD, a saphenous vein  graft to the obtuse marginal branch of the left circumflex coronary  artery, and a sequential saphenous vein graft to the posterior  descending and posterolateral branches of the right coronary artery.  Endoscopic vein harvesting from both sides.    Medications: I have reviewed  the patient's current medications.    Marland Kitchen aspirin EC  325 mg Oral Daily  . atorvastatin  40 mg Oral q1800  . clorazepate  3.75 mg Oral QHS  . darbepoetin (ARANESP) injection - DIALYSIS  60 mcg Intravenous Q Thu-HD  . docusate sodium  200 mg Oral Daily  . gabapentin  200 mg Oral QHS  . hydrOXYzine  50 mg Oral QHS  . moving right along book   Does not apply Once  . multivitamin  1 tablet Oral QHS  . pantoprazole  40 mg Oral Daily  . paricalcitol  1 mcg Intravenous Q T,Th,Sa-HD  . sevelamer  800 mg Oral Q breakfast  . sodium chloride  3 mL Intravenous Q12H  . DISCONTD: acetaminophen (TYLENOL) oral liquid 160 mg/5 mL  975 mg Per Tube Q6H  . DISCONTD: acetaminophen  1,000 mg Oral Q6H  . DISCONTD: aspirin  324 mg Per Tube Daily  . DISCONTD: aspirin EC  325 mg Oral Daily  . DISCONTD: bisacodyl  10 mg Oral Daily  . DISCONTD: bisacodyl  10 mg Rectal Daily  . DISCONTD: docusate sodium  200 mg Oral Daily  . DISCONTD: insulin aspart  0-24 Units Subcutaneous TID AC & HS  . DISCONTD: metoprolol tartrate  12.5 mg Per Tube BID  . DISCONTD: metoprolol tartrate  25 mg Oral BID  . DISCONTD: sodium chloride  3 mL Intravenous Q12H   Assessment/Plan: Principal Problem:  *CAD (coronary artery disease): Severe three vessel Active Problems:  ESRD (end stage renal disease) on dialysis  Knee pain, acute, right  S/P CABG x 5, 08/15/12, LIMA-LAD;VG-diag;VG-OM; seq VG-PDA,PLA   PLAN: 4 Days Post-Op Procedure(s) (LRB):  CORONARY ARTERY BYPASS GRAFTING (CABG) (N/A)   Dialysis yesterday.      LOS: 6 days   Diane [name],LAURA R 08/18/2012, 7:58 AM   I have seen and examined the patient along with Diane Boozer, NP.  I have reviewed the chart, notes and new data.  I agree with NP's note.  Key new complaints: "pulled off more fluid than usual" Key examination changes: mild to moderate tachycardia Key new findings / data: minimally anemic  PLAN: Weight is back to baseline, but the change was abrupt and her  tachycardia may be due to relative hypovolemia, which could also explain the relatively low BP.  Encourage PO fluids and restart beta blockers (was on 50 mg BID nonHD days and 25 mg BID on HD days).  Thurmon Fair, MD, [facility] [phone] 08/18/2012, 8:26 AM

## 2012-08-18 NOTE — Progress Notes (Signed)
Patient ID: Diane [name], female   DOB: [birth year], 77 y.o.   MRN: [id number]  [facility] Progress Note    Subjective:   Still c/o being sore   Objective:   BP 95/63  Pulse 113  Temp 98.3 F (36.8 C) (Oral)  Resp 16  Ht 5\' 2"  (1.575 m)  Wt 51.982 kg (114 lb 9.6 oz)  BMI 20.96 kg/m2  SpO2 97%  Physical Exam: Gen:WD elderly WF in NAd WUJ:WJXBJ Resp:decreased BS at bases YNW:GNFAOZ Ext:AVF LUE +T/B  Labs: BMET  Lab 08/18/12 0615 08/17/12 0545 08/16/12 0350 08/15/12 1643 08/15/12 1640 08/15/12 0905 08/15/12 0335 08/14/12 1820 08/14/12 0445 08/13/12 0707  NA 145 144 147* 155* -- 153* 152* 149* -- --  K 3.7 4.9 4.8 5.0 -- 4.8 4.0 4.1 -- --  CL 106 116* 116* 125* -- 122* 123* 121* -- --  CO2 26 17* 18* -- -- 18* 15* -- 25 20  GLUCOSE 112* 117* 134* 140* -- 132* 138* 133* -- --  BUN 23 50* 35* 29* -- 25* 24* 19 -- --  CREATININE 3.18* 5.13* 4.27* 3.70* 4.05* 3.76* 3.57* -- -- --  ALBUMIN -- -- -- -- -- -- -- -- -- 3.6  CALCIUM 9.2 8.9 8.8 -- -- 9.2 9.2 -- 9.6 9.4  PHOS -- -- -- -- -- -- -- -- -- 4.1   CBC  Lab 08/18/12 0615 08/16/12 0350 08/15/12 1643 08/15/12 1640 08/15/12 0335  WBC 9.1 12.4* -- 13.1* 12.1*  NEUTROABS -- -- -- -- --  HGB 10.3* 9.8* 10.2* 11.0* --  HCT 31.9* 30.2* 30.0* 32.9* --  MCV 92.2 94.7 -- 94.3 88.8  PLT 128* 87* -- 97* 105*    @IMGRELPRIORS @ Medications:      . aspirin EC  325 mg Oral Daily  . atorvastatin  40 mg Oral q1800  . clorazepate  3.75 mg Oral QHS  . darbepoetin (ARANESP) injection - DIALYSIS  60 mcg Intravenous Q Thu-HD  . docusate sodium  200 mg Oral Daily  . gabapentin  200 mg Oral QHS  . hydrOXYzine  50 mg Oral QHS  . moving right along book   Does not apply Once  . multivitamin  1 tablet Oral QHS  . pantoprazole  40 mg Oral Daily  . paricalcitol  1 mcg Intravenous Q T,Th,Sa-HD  . sevelamer  800 mg Oral Q breakfast  . sodium chloride  3 mL Intravenous Q12H  . DISCONTD: acetaminophen (TYLENOL) oral  liquid 160 mg/5 mL  975 mg Per Tube Q6H  . DISCONTD: acetaminophen  1,000 mg Oral Q6H  . DISCONTD: aspirin  324 mg Per Tube Daily  . DISCONTD: aspirin EC  325 mg Oral Daily  . DISCONTD: bisacodyl  10 mg Oral Daily  . DISCONTD: bisacodyl  10 mg Rectal Daily  . DISCONTD: docusate sodium  200 mg Oral Daily  . DISCONTD: insulin aspart  0-24 Units Subcutaneous TID AC & HS  . DISCONTD: metoprolol tartrate  12.5 mg Per Tube BID  . DISCONTD: metoprolol tartrate  25 mg Oral BID  . DISCONTD: sodium chloride  3 mL Intravenous Q12H     Assessment/ Plan:   1. S/P CABG x 5 (08/14/12)- pod #4, stable off vent 2. Hypernatremia- improved with D5W, now off and taking po. Follow after HD. 3. ESRD/CKD- usual hd tts. Will get back on schedule this week. Hopefully can wait until Thursday but will follow closely 4. Htn/volume- On Metoprolol 12.5 mg bid po. No vol  excess by xray or exam.   5. Anemia - on q 2 week IV Fe, will dose x 1 q weekly in hospital. Giving Aranesp in place of outpt EPO.  6. Metabolic bone disease- cont binder w meals, no vit D 7. Peripheral neuropathy - continue neurontin 200mg  hs 8. Hx bipolar d/o - takes Tranxene 3.75 mg at night, "every night" per pt. Watch for signs of w/d. 9. Dispo- pt reports that she would like written instructions on what she can do to help herself get better because she has trouble remembering things on her own. Amare Bail A 08/18/2012, 10:05 AM

## 2012-08-18 NOTE — Progress Notes (Signed)
CARDIAC REHAB PHASE I   PRE:  Rate/Rhythm: 113 ST    BP: sitting 106/60    SaO2: 96  RA  MODE:  Ambulation: 200 ft   POST:  Rate/Rhythm: 139 ST    BP: sitting 106/60     SaO2: 95 RA  Pt HR up. Pt insisted on putting in dentures and brushing hair at sink, which got HR to 139. Seemed asymptomatic. Only c/o was soreness and inability to take a deep breath. Able to walk assist x1 (started with assist x2) and RW and gait belt. Trouble steering walker with slow pace. HR in 130s. To recliner after walk. Practiced IS. Encouraged more walking. Pt very anxious about everything. Will f/u. [phone]  Harriet Masson CES, ACSM

## 2012-08-18 NOTE — Progress Notes (Addendum)
Late entry: 08/14/12, 1345.   Patient stable upon transfer to respiratory.  Report given to 2300 RN Nikki Dom at bedside on 2300.

## 2012-08-18 NOTE — Progress Notes (Signed)
08/18/2012 6:49 PM Nursing note Pt ambulated 100 ft with RW, RN, and on Ra. Pt. Tolerated ambulation well. Encouraged one more walk this evening.  Breda Bond, Blanchard Kelch

## 2012-08-19 LAB — RENAL FUNCTION PANEL
Albumin: 2.7 g/dL — ABNORMAL LOW (ref 3.5–5.2)
GFR calc Af Amer: 11 mL/min — ABNORMAL LOW (ref >90)
Phosphorus: 3.2 mg/dL (ref 2.3–4.6)
Potassium: 4 mEq/L (ref 3.5–5.1)
Sodium: 141 mEq/L (ref 135–145)

## 2012-08-19 LAB — CBC
Platelets: 138 10*3/uL — ABNORMAL LOW (ref 150–400)
RDW: 14.3 % (ref 11.5–15.5)
WBC: 9 10*3/uL (ref 4.0–10.5)

## 2012-08-19 MED ORDER — OXYCODONE HCL 5 MG PO TABS
5.0000 mg | ORAL_TABLET | ORAL | Status: DC | PRN
Start: 2012-08-19 — End: 2013-08-11

## 2012-08-19 MED ORDER — METOPROLOL TARTRATE 12.5 MG HALF TABLET
12.5000 mg | ORAL_TABLET | Freq: Two times a day (BID) | ORAL | Status: DC
  Administered 2012-08-19 – 2012-08-20 (×3): 12.5 mg via ORAL
  Filled 2012-08-19 (×6): qty 1

## 2012-08-19 MED ORDER — ATORVASTATIN CALCIUM 40 MG PO TABS: 40.0000 mg | ORAL_TABLET | Freq: Every day | ORAL | Status: DC

## 2012-08-19 MED ORDER — OLOPATADINE HCL 0.1 % OP SOLN: 1.0000 [drp] | Freq: Two times a day (BID) | OPHTHALMIC | Status: DC | PRN

## 2012-08-19 MED ORDER — ASPIRIN 325 MG PO TBEC: 325.0000 mg | DELAYED_RELEASE_TABLET | Freq: Every day | ORAL | Status: DC

## 2012-08-19 MED ORDER — DARBEPOETIN ALFA-POLYSORBATE 60 MCG/0.3ML IJ SOLN: 60.0000 ug | INTRAMUSCULAR | Status: DC

## 2012-08-19 NOTE — Progress Notes (Signed)
Pt ambulated 250 feet in hallway with RN and rolling walker; pt assisted back to bed; call bell w/i reach; will cont. To monitor.

## 2012-08-19 NOTE — Progress Notes (Signed)
EPW d/c per MD order; VSS; bedrest until 1235; pt education given; will cont. To monitor.

## 2012-08-19 NOTE — Evaluation (Signed)
Occupational Therapy Evaluation Patient Details Name: Diane [name] MRN: [id number] DOB: October 21, 1935 Today's Date: 08/19/2012 Time: 4098-1191 OT Time Calculation (min): 33 min  OT Assessment / Plan / Recommendation Clinical Impression  Pt is recovering from CABG.  Activity tolerance limited by increased HR and fatigue.  Pt complaining of memory deficits since hospitalization.  Somewhat anxious about implementing sternal precautions once home with husband.  Pt requiring min for ADL and mobility.  Will benefit from acute OT to address the below deficit areas and post hospital ST rehab at a SNF.  Pt is agreeable to this plan.    OT Assessment  Patient needs continued OT Services    Follow Up Recommendations  Skilled nursing facility    Barriers to Discharge      Equipment Recommendations  3 in 1 bedside comode;Rolling walker with 5" wheels    Recommendations for Other Services    Frequency  Min 2X/week    Precautions / Restrictions Precautions Precautions: Sternal;Fall Precaution Comments: Instructed in sternal precautions and provided handout. Restrictions Weight Bearing Restrictions: No   Pertinent Vitals/Pain No c/o pain   ADL  Eating/Feeding: Simulated;Independent Where Assessed - Eating/Feeding: Chair Grooming: Performed;Min guard Where Assessed - Grooming: Unsupported standing Upper Body Bathing: Simulated;Minimal assistance Where Assessed - Upper Body Bathing: Unsupported sitting Lower Body Bathing: Minimal assistance Where Assessed - Lower Body Bathing: Supported sit to stand Upper Body Dressing: Simulated;Set up Where Assessed - Upper Body Dressing: Unsupported sitting Lower Body Dressing: Performed;Min guard Where Assessed - Lower Body Dressing: Supported sit to stand Toilet Transfer: Performed;Min guard Statistician Method: Sit to Barista: Materials engineer and Hygiene: Performed;Min guard Where  Assessed - Engineer, mining and Hygiene: Sit to stand from 3-in-1 or toilet Equipment Used: Rolling walker Transfers/Ambulation Related to ADLs: min assist with RW, verbal cues for technique, PT used rollator ADL Comments: HR increased to 132 with bed mobility and to 140 with ambulation.  Pt educated in sternal precautions related to ADL/IADL.  Also began education in energy conservation strategies.    OT Diagnosis: Generalized weakness;Cognitive deficits  OT Problem List: Decreased activity tolerance;Impaired balance (sitting and/or standing);Decreased cognition;Decreased knowledge of use of DME or AE;Cardiopulmonary status limiting activity OT Treatment Interventions: Self-care/ADL training;DME and/or AE instruction;Energy conservation;Cognitive remediation/compensation;Patient/family education   OT Goals Acute Rehab OT Goals OT Goal Formulation: With patient Time For Goal Achievement: 09/02/12 Potential to Achieve Goals: Good ADL Goals Pt Will Perform Grooming: with supervision;Standing at sink;Other (comment) (3 activities) ADL Goal: Grooming - Progress: Goal set today Pt Will Perform Upper Body Bathing: with set-up;Sitting at sink ADL Goal: Upper Body Bathing - Progress: Goal set today Pt Will Perform Lower Body Bathing: with supervision;Sitting at sink ADL Goal: Lower Body Bathing - Progress: Goal set today Pt Will Perform Lower Body Dressing: with supervision;Sit to stand from bed ADL Goal: Lower Body Dressing - Progress: Goal set today Pt Will Transfer to Toilet: with supervision;Ambulation;with DME ADL Goal: Toilet Transfer - Progress: Goal set today Miscellaneous OT Goals Miscellaneous OT Goal #1: Pt will state at least 3 energy conservation strategies independently. OT Goal: Miscellaneous Goal #1 - Progress: Goal set today Miscellaneous OT Goal #2: Pt will generalize sternal precautions in ADL with minimal verbal reminders. OT Goal: Miscellaneous Goal #2 -  Progress: Goal set today  Visit Information  Last OT Received On: 08/19/12 Assistance Needed: +1    Subjective Data  Subjective: "I can't remember a lot of things." Patient Stated  Goal: Pt wants further rehab prior to return home.   Prior Functioning     Home Living Lives With: Spouse Available Help at Discharge: Skilled Nursing Facility Type of Home: House Home Access: Stairs to enter [employer] of Steps: 2-3 Entrance Stairs-Rails:  (post to hold) Home Layout: One level Bathroom Shower/Tub: Tub/shower unit;Walk-in shower Bathroom Toilet: Handicapped height Home Adaptive Equipment: Straight cane Additional Comments: Husband is of minimal assistance at home with IADL. Prior Function Level of Independence: Independent Able to Take Stairs?: Yes Driving: Yes Vocation: Retired Musician: No difficulties Dominant Hand: Right         Vision/Perception     Cognition  Overall Cognitive Status: Impaired Area of Impairment: Memory Arousal/Alertness: Awake/alert Orientation Level: Appears intact for tasks assessed Behavior During Session: [facility] for tasks performed Memory: Decreased recall of precautions Memory Deficits: pt self reports decr memory since in hospital.    Extremity/Trunk Assessment Right Upper Extremity Assessment RUE ROM/Strength/Tone: WFL for tasks assessed RUE Coordination: WFL - gross/fine motor Left Upper Extremity Assessment LUE ROM/Strength/Tone: WFL for tasks assessed LUE Coordination: WFL - gross/fine motor Right Lower Extremity Assessment RLE ROM/Strength/Tone: Deficits RLE ROM/Strength/Tone Deficits: grossly 3+/5 Left Lower Extremity Assessment LLE ROM/Strength/Tone: Deficits LLE ROM/Strength/Tone Deficits: 3+/5     Mobility Bed Mobility Bed Mobility: Not assessed Transfers Transfers: Sit to Stand;Stand to Sit Sit to Stand: From elevated surface;From chair/3-in-1;4: Min assist Stand to Sit: To  chair/3-in-1;4: Min guard     Shoulder Instructions     Exercise     Balance     End of Session OT - End of Session Activity Tolerance: Patient limited by fatigue;Treatment limited secondary to medical complications (Comment) (elevated HR) Patient left: in chair;with call bell/phone within reach;with family/visitor present  GO     Evern Bio 08/19/2012, 9:41 AM [phone]

## 2012-08-19 NOTE — Progress Notes (Signed)
Subjective: No complaints, sitting up in chair  Objective: Vital signs in last 24 hours: Temp:  [98.2 F (36.8 C)-98.6 F (37 C)] 98.5 F (36.9 C) (10/30 0603) Pulse Rate:  [100-140] 112  (10/30 1001) Resp:  [16-18] 17  (10/30 0603) BP: (97-138)/(56-65) 113/64 mmHg (10/30 1001) SpO2:  [96 %-97 %] 96 % (10/30 0603) Weight:  [51.9 kg (114 lb 6.7 oz)] 51.9 kg (114 lb 6.7 oz) (10/30 0603) Weight change: -4.1 kg (-9 lb 0.6 oz) Last BM Date: 08/18/12 Intake/Output from previous day: -841 10/29 0701 - 10/30 0700 In: 360 [P.O.:360] Out: 1201 [Urine:1200; Stool:1] Intake/Output this shift:    PE: General:alert and oriented, MAE, pleasant affect Heart:S1S2 RRR Lungs:clear no wheezes Abd:+ BS, soft, non tender Ext:no edema    Lab Results:  [facility] 08/19/12 0450 08/18/12 0615  WBC 9.0 9.1  HGB 10.9* 10.3*  HCT 33.1* 31.9*  PLT 138* 128*   BMET  Basename 08/19/12 0450 08/18/12 0615  NA 141 145  K 4.0 3.7  CL 106 106  CO2 24 26  GLUCOSE 129* 112*  BUN 41* 23  CREATININE 4.32* 3.18*  CALCIUM 9.5 9.2     Hepatic Function Panel  Basename 08/19/12 0450  PROT --  ALBUMIN 2.7*  AST --  ALT --  ALKPHOS --  BILITOT --  BILIDIR --  IBILI --    EKG: Orders placed during the hospital encounter of 08/12/12  . EKG 12-LEAD  . EKG 12-LEAD  . EKG 12-LEAD  . EKG 12-LEAD  . EKG 12-LEAD  . EKG 12-LEAD    Studies/Results: No results found.  Medications: I have reviewed the patient's current medications.    Marland Kitchen aspirin EC  325 mg Oral Daily  . atorvastatin  40 mg Oral q1800  . clorazepate  3.75 mg Oral QHS  . darbepoetin (ARANESP) injection - DIALYSIS  60 mcg Intravenous Q Thu-HD  . docusate sodium  200 mg Oral Daily  . gabapentin  200 mg Oral QHS  . hydrOXYzine  50 mg Oral QHS  . metoprolol tartrate  12.5 mg Oral BID  . multivitamin  1 tablet Oral QHS  . pantoprazole  40 mg Oral Daily  . paricalcitol  1 mcg Intravenous Q T,Th,Sa-HD  . sevelamer  800 mg Oral Q  breakfast  . sodium chloride  3 mL Intravenous Q12H   Assessment/Plan: Principal Problem:  *CAD (coronary artery disease): Severe three vessel Active Problems:  ESRD (end stage renal disease) on dialysis  Knee pain, acute, right  S/P CABG x 5, 08/15/12, LIMA-LAD;VG-diag;VG-OM; seq VG-PDA,PLA   PLAN:  5 Days Post-Op Procedure(s) (LRB):  CORONARY ARTERY BYPASS GRAFTING (CABG) (N/A)  ENDOVEIN HARVEST OF GREATER SAPHENOUS VEIN   HR up to 132 with increased SOB.  BP borderline 97/56 to 113/64 Anemia on aranesp Dialysis tomorrow  LOS: 7 days   Diane [name],Diane [name] 08/19/2012, 10:42 AM

## 2012-08-19 NOTE — Progress Notes (Addendum)
5 Days Post-Op Procedure(s) (LRB): CORONARY ARTERY BYPASS GRAFTING (CABG) (N/A) ENDOVEIN HARVEST OF GREATER SAPHENOUS VEIN (Bilateral) Subjective:  Diane [name] has no new complaints this morning.  She continues to be concerned about discharge planning due to her husband being overwhelmed with taking care of her.    Objective: Vital signs in last 24 hours: Temp:  [98.2 F (36.8 C)-98.6 F (37 C)] 98.5 F (36.9 C) (10/30 0603) Pulse Rate:  [100-122] 112  (10/30 0603) Cardiac Rhythm:  [-] Sinus tachycardia (10/29 2100) Resp:  [16-18] 17  (10/30 0603) BP: (97-138)/(56-65) 97/56 mmHg (10/30 0603) SpO2:  [96 %-97 %] 96 % (10/30 0603) Weight:  [114 lb 6.7 oz (51.9 kg)] 114 lb 6.7 oz (51.9 kg) (10/30 0603)    Intake/Output from previous day: 10/29 0701 - 10/30 0700 In: 360 [P.O.:360] Out: 1201 [Urine:1200; Stool:1]  General appearance: alert, cooperative and no distress Heart: regular rate and rhythm Lungs: clear to auscultation bilaterally Abdomen: soft, non-tender; bowel sounds normal; no masses,  no organomegaly Extremities: edema trace edema Wound: clean and dry  Lab Results:  [facility] 08/19/12 0450 08/18/12 0615  WBC 9.0 9.1  HGB 10.9* 10.3*  HCT 33.1* 31.9*  PLT 138* 128*   BMET:  Basename 08/19/12 0450 08/18/12 0615  NA 141 145  K 4.0 3.7  CL 106 106  CO2 24 26  GLUCOSE 129* 112*  BUN 41* 23  CREATININE 4.32* 3.18*  CALCIUM 9.5 9.2    PT/INR: No results found for this basename: LABPROT,INR in the last 72 hours ABG    Component Value Date/Time   PHART 7.355 08/15/2012 0540   HCO3 17.9* 08/15/2012 0540   TCO2 19 08/15/2012 1643   ACIDBASEDEF 7.0* 08/15/2012 0540   O2SAT 99.0 08/15/2012 0540   CBG (last 3)   Basename 08/18/12 0629 08/17/12 2120 08/17/12 1701  GLUCAP 114* 125* 79    Assessment/Plan: S/P Procedure(s) (LRB): CORONARY ARTERY BYPASS GRAFTING (CABG) (N/A) ENDOVEIN HARVEST OF GREATER SAPHENOUS VEIN (Bilateral)  1. CV- NSR remains  tachycardic and hypotensive, will start low dose beta blocker when patient able to tolerate 2. Pulm- no acute issues 3. CKD- Nephrology following, going to try and wait to HD until tomorrow, which will be patients outpatient regimen 4. Volume Overload- resolved, patient is back to baseline weight 5. Chronic Anemia- continue Aranesp 6. Dispo- patient doing well, will D/C EPW today, probable HD tomorrow and plan for SNF placement on Friday   LOS: 7 days    BARRETT, ERIN 08/19/2012    Chart reviewed, patient examined, agree with above.

## 2012-08-19 NOTE — Clinical Social Work Psychosocial (Signed)
Clinical Social Work Department BRIEF PSYCHOSOCIAL ASSESSMENT 08/19/2012  Patient:  Diane [name], Diane [name]     Account Number:  [id number]     Admit date:  08/12/2012  Clinical Social Worker:  Thomasene Mohair  Date/Time:  08/19/2012 12:00 N  Referred by:  Physician  Date Referred:  08/18/2012 Referred for  SNF Placement   Other Referral:   Interview type:  Patient Other interview type:   husband at bedside    PSYCHOSOCIAL DATA Living Status:  HUSBAND Admitted from facility:   Level of care:   Primary support name:  Lenita Peregrina [phone] cell /[phone] home Primary support relationship to patient:  SPOUSE Degree of support available:   strong.  Pt also has several adult children and grandchildren    CURRENT CONCERNS Current Concerns  Post-Acute Placement   Other Concerns:    SOCIAL WORK ASSESSMENT / PLAN CSW was referred to Pt to assist with dc planning. Pt's husband of 45 years was at bedside and engaged in dc planning. Pt lives with spouse and drives herself to dialysis t,t,saturday at [facility]. Pt and spouse would prefer for Pt to go to SNF [facility] as well because it is near their house.  Pt reports adjusting well to heart surgery and demonstrated a positive attitude towards her recovery.  Pt has a hx of bipolar disorder that was addressed d/t pasarr review necessary for SNF placement. Pt has no acute bipolar symptoms and reports that it has been well controlled with medications and coping techniques. CSW answered family's questions and will begin bed search.   Assessment/plan status:  Psychosocial Support/Ongoing Assessment of Needs Other assessment/ plan:   Information/referral to community resources:   SNF bed list    PATIENT'S/FAMILY'S RESPONSE TO PLAN OF CARE: Pt's spouse shared that he has been worried about dc planning and wants to keep his wife close to home for rehab.  Pt is agreeable and motivated to get back to living at home.  Pt  would like [facility] at [facility].    Frederico Hamman, LCSW [phone]

## 2012-08-19 NOTE — Progress Notes (Signed)
CARDIAC REHAB PHASE I   PRE:  Rate/Rhythm: 109ST  BP:  Supine: 102/66  Sitting:   Standing:    SaO2: 96%RA  MODE:  Ambulation: 300 ft   POST:  Rate/Rhythem: 120  BP:  Supine: 108/70  Sitting:   Standing:    SaO2: 97%RA 1312-1340 Pt walked 300 ft on RA with rollator with asst x1. Gait steady. Tolerated well. C/o neck soreness. To bed after walk. HR to 120 during walk.   Duanne Limerick

## 2012-08-19 NOTE — Progress Notes (Signed)
Patient ID: Diane [name], female   DOB: 08-Nov-1934, 77 y.o.   MRN: [id number]  [name] Progress Note    Subjective:   Feels SOB this am.  But admits to feeling less pain at incision   Objective:   BP 97/56  Pulse 132  Temp 98.5 F (36.9 C) (Oral)  Resp 17  Ht 5\' 2"  (1.575 m)  Wt 51.9 kg (114 lb 6.7 oz)  BMI 20.93 kg/m2  SpO2 96%  Physical Exam: WUJ:WJXBJ, elderly WF in NAD YNW:GNFAOZHYQMV at 132 Resp:cta HQI:ONGEXB Ext:no edema  Labs: BMET  Lab 08/19/12 0450 08/18/12 0615 08/17/12 0545 08/16/12 0350 08/15/12 1643 08/15/12 1640 08/15/12 0905 08/15/12 0335 08/14/12 0445 08/13/12 0707  NA 141 145 144 147* 155* -- 153* 152* -- --  K 4.0 3.7 4.9 4.8 5.0 -- 4.8 4.0 -- --  CL 106 106 116* 116* 125* -- 122* 123* -- --  CO2 24 26 17* 18* -- -- 18* 15* 25 --  GLUCOSE 129* 112* 117* 134* 140* -- 132* 138* -- --  BUN 41* 23 50* 35* 29* -- 25* 24* -- --  CREATININE 4.32* 3.18* 5.13* 4.27* 3.70* 4.05* 3.76* -- -- --  ALBUMIN 2.7* -- -- -- -- -- -- -- -- 3.6  CALCIUM 9.5 9.2 8.9 8.8 -- -- 9.2 9.2 9.6 --  PHOS 3.2 -- -- -- -- -- -- -- -- 4.1   CBC  Lab 08/19/12 0450 08/18/12 0615 08/16/12 0350 08/15/12 1643 08/15/12 1640  WBC 9.0 9.1 12.4* -- 13.1*  NEUTROABS -- -- -- -- --  HGB 10.9* 10.3* 9.8* 10.2* --  HCT 33.1* 31.9* 30.2* 30.0* --  MCV 91.7 92.2 94.7 -- 94.3  PLT 138* 128* 87* -- 97*    @IMGRELPRIORS @ Medications:      . aspirin EC  325 mg Oral Daily  . atorvastatin  40 mg Oral q1800  . clorazepate  3.75 mg Oral QHS  . darbepoetin (ARANESP) injection - DIALYSIS  60 mcg Intravenous Q Thu-HD  . docusate sodium  200 mg Oral Daily  . gabapentin  200 mg Oral QHS  . hydrOXYzine  50 mg Oral QHS  . multivitamin  1 tablet Oral QHS  . pantoprazole  40 mg Oral Daily  . paricalcitol  1 mcg Intravenous Q T,Th,Sa-HD  . sevelamer  800 mg Oral Q breakfast  . sodium chloride  3 mL Intravenous Q12H     Assessment/ Plan:   1. Tachycardia-  HR was up  to 132 this am and patient c/o SOB.  Will defer to CT surgery and cardiology to adjust meds. 2. S/P CABG x 5 (08/14/12)- pod #5, stable off vent 3. Hypernatremia- improved with po intake and after HD. 4. ESRD/CKD- usual hd tts. Will get back on schedule tomorrow. 5. Htn/volume- On Metoprolol 12.5 mg bid po. No vol excess by xray or exam. May need to increase dose to help with tachycardia, will defer to cards 6. Anemia - on q 2 week IV Fe, will dose x 1 q weekly in hospital. Giving Aranesp in place of outpt EPO.  7. Metabolic bone disease- cont binder w meals, no vit D 8. Peripheral neuropathy - continue neurontin 200mg  hs 9. Hx bipolar d/o - takes Tranxene 3.75 mg at night, "every night" per pt. Watch for signs of w/d. 10. Dispo- pt reports that she would like written instructions on what she can do to help herself get better because she has trouble remembering things on her own.  11.  [name] A 08/19/2012, 8:45 AM

## 2012-08-19 NOTE — Progress Notes (Signed)
Pt. Seen and examined. Agree with the NP/PA-C note as written.  Remains tachycardic, agree with increasing b-blocker. She was just started this morning, probably uptitrate to 25 mg bid lopressor tomorrow. Would not try to take off too much volume due to hypotension.  Chrystie Nose, MD, [facility] Attending Cardiologist [facility]

## 2012-08-19 NOTE — Evaluation (Signed)
Physical Therapy Evaluation Patient Details Name: Diane [name] MRN: [id number] DOB: 12-01-1934 Today's Date: 08/19/2012 Time: 4098-1191 PT Time Calculation (min): 26 min  PT Assessment / Plan / Recommendation Clinical Impression  Pt s/p CABG.  Pt also on HD 3x/wk.  Pt with decr functional activity tolerance and will need ST-SNF before return home.    PT Assessment  Patient needs continued PT services    Follow Up Recommendations  Post acute inpatient    Does the patient have the potential to tolerate intense rehabilitation   No, Recommend SNF  Barriers to Discharge        Equipment Recommendations  3 in 1 bedside comode;Rolling walker with 5" wheels    Recommendations for Other Services     Frequency Min 3X/week    Precautions / Restrictions Precautions Precautions: Sternal;Fall Precaution Comments: Instructed in sternal precautions and provided handout. Restrictions Weight Bearing Restrictions: No   Pertinent Vitals/Pain HR 130 sitting EOB and 140 with amb      Mobility  Bed Mobility Bed Mobility: Not assessed Transfers Transfers: Stand Pivot Transfers Sit to Stand: 4: Min assist;With upper extremity assist;From chair/3-in-1 Stand to Sit: 4: Min assist;With upper extremity assist;To chair/3-in-1 Stand Pivot Transfers: 4: Min assist Details for Transfer Assistance: Verbal cues for pt to use legs to come to stand and to place hands on knees. Ambulation/Gait Ambulation/Gait Assistance: 4: Min assist Ambulation Distance (Feet): 100 Feet Assistive device: Rollator Ambulation/Gait Assistance Details: verbal cues to stand more erect. Gait Pattern: Step-through pattern;Decreased stride length;Trunk flexed;Narrow base of support Gait velocity: decr    Shoulder Instructions     Exercises     PT Diagnosis: Difficulty walking;Generalized weakness  PT Problem List: Decreased strength;Decreased activity tolerance;Decreased balance;Decreased mobility;Decreased  knowledge of precautions;Decreased knowledge of use of DME PT Treatment Interventions: DME instruction;Gait training;Stair training;Functional mobility training;Patient/family education;Therapeutic activities;Therapeutic exercise;Balance training   PT Goals Acute Rehab PT Goals PT Goal Formulation: With patient Time For Goal Achievement: 08/26/12 Potential to Achieve Goals: Good Pt will go Supine/Side to Sit: with supervision PT Goal: Supine/Side to Sit - Progress: Goal set today Pt will go Sit to Supine/Side: with supervision PT Goal: Sit to Supine/Side - Progress: Goal set today Pt will go Sit to Stand: with supervision PT Goal: Sit to Stand - Progress: Goal set today Pt will go Stand to Sit: with supervision PT Goal: Stand to Sit - Progress: Goal set today Pt will Stand: with supervision;3 - 5 min;with unilateral upper extremity support PT Goal: Stand - Progress: Goal set today Pt will Ambulate: >150 feet;with supervision;with least restrictive assistive device PT Goal: Ambulate - Progress: Goal set today Pt will Go Up / Down Stairs: 1-2 stairs;with supervision;with least restrictive assistive device PT Goal: Up/Down Stairs - Progress: Goal set today  Visit Information  Last PT Received On: 08/19/12 Assistance Needed: +1    Subjective Data  Subjective: Pt states her husband won't be able to take care of her at home. Patient Stated Goal: Return home when she is more independent.   Prior Functioning  Home Living Lives With: Spouse Available Help at Discharge: Skilled Nursing Facility Type of Home: House Home Access: Stairs to enter [employer] of Steps: 2-3 Entrance Stairs-Rails:  (post to hold) Home Layout: One level Bathroom Shower/Tub: Tub/shower unit;Walk-in shower Bathroom Toilet: Handicapped height Home Adaptive Equipment: Straight cane Additional Comments: Husband is of minimal assistance at home with IADL. Prior Function Level of Independence:  Independent Able to Take Stairs?: Yes Driving: Yes Vocation:  Retired Musician: No difficulties Dominant Hand: Right    Cognition  Overall Cognitive Status: Impaired Area of Impairment: Memory Arousal/Alertness: Awake/alert Orientation Level: Appears intact for tasks assessed Behavior During Session: WFL for tasks performed Memory: Decreased recall of precautions Memory Deficits: pt self reports decr memory since in hospital.    Extremity/Trunk Assessment Right Upper Extremity Assessment RUE ROM/Strength/Tone: WFL for tasks assessed RUE Coordination: WFL - gross/fine motor Left Upper Extremity Assessment LUE ROM/Strength/Tone: WFL for tasks assessed LUE Coordination: WFL - gross/fine motor Right Lower Extremity Assessment RLE ROM/Strength/Tone: Deficits RLE ROM/Strength/Tone Deficits: grossly 3+/5 Left Lower Extremity Assessment LLE ROM/Strength/Tone: Deficits LLE ROM/Strength/Tone Deficits: 3+/5   Balance Static Standing Balance Static Standing - Balance Support: Bilateral upper extremity supported Static Standing - Level of Assistance: 4: Min assist  End of Session PT - End of Session Activity Tolerance: Other (comment) (amb limited by high HR) Patient left: in chair;with call bell/phone within reach;Other (comment) (with OT in room) Nurse Communication: Mobility status  GP     [facility] 08/19/2012, 11:25 AM  [facility] PT [phone]

## 2012-08-19 NOTE — Clinical Social Work Placement (Signed)
Clinical Social Work Department CLINICAL SOCIAL WORK PLACEMENT NOTE 08/19/2012  Patient:  Diane [name], Diane [name]  Account Number:  [id number] Admit date:  08/12/2012  Clinical Social Worker:  Frederico Hamman, LCSW  Date/time:  08/19/2012 12:00 M  Clinical Social Work is seeking post-discharge placement for this patient at the following level of care:   SKILLED NURSING   (*CSW will update this form in Epic as items are completed)   08/19/2012  Patient/family provided with [facility] Department of Clinical Social Work's list of facilities offering this level of care within the geographic area requested by the patient (or if unable, by the patient's family).  08/19/2012  Patient/family informed of their freedom to choose among providers that offer the needed level of care, that participate in Medicare, Medicaid or managed care program needed by the patient, have an available bed and are willing to accept the patient.  08/19/2012  Patient/family informed of [facility]' ownership interest in [facility], as well as of the fact that they are under no obligation to receive care at this facility.  PASARR submitted to EDS on 08/20/2012 PASARR number received from [employer] on   FL2 transmitted to all facilities in geographic area requested by pt/family on  08/19/2012 FL2 transmitted to all facilities within larger geographic area on   Patient informed that his/her managed care company has contracts with or will negotiate with  certain facilities, including the following:     Patient/family informed of bed offers received:  08/19/2012 Patient chooses bed at [facility] Physician recommends and patient chooses bed at    Patient to be transferred to [facility] on   Patient to be transferred to facility by [facility]  The following physician request were entered in Epic:   Additional Comments:

## 2012-08-20 ENCOUNTER — Inpatient Hospital Stay (HOSPITAL_COMMUNITY): Payer: Medicare Other

## 2012-08-20 DIAGNOSIS — R9439 Abnormal result of other cardiovascular function study: Secondary | ICD-10-CM | POA: Diagnosis present

## 2012-08-20 DIAGNOSIS — D649 Anemia, unspecified: Secondary | ICD-10-CM | POA: Diagnosis present

## 2012-08-20 LAB — CBC
HCT: 31 % — ABNORMAL LOW (ref 36.0–46.0)
Hemoglobin: 10.2 g/dL — ABNORMAL LOW (ref 12.0–15.0)
MCH: 30.5 pg (ref 26.0–34.0)
MCHC: 32.9 g/dL (ref 30.0–36.0)
MCV: 92.8 fL (ref 78.0–100.0)
Platelets: 155 10*3/uL (ref 150–400)
RBC: 3.34 MIL/uL — ABNORMAL LOW (ref 3.87–5.11)
RDW: 14.3 % (ref 11.5–15.5)
WBC: 9.1 10*3/uL (ref 4.0–10.5)

## 2012-08-20 LAB — RENAL FUNCTION PANEL
Albumin: 2.8 g/dL — ABNORMAL LOW (ref 3.5–5.2)
BUN: 57 mg/dL — ABNORMAL HIGH (ref 6–23)
CO2: 22 mEq/L (ref 19–32)
Calcium: 9.6 mg/dL (ref 8.4–10.5)
Chloride: 105 mEq/L (ref 96–112)
Creatinine, Ser: 4.8 mg/dL — ABNORMAL HIGH (ref 0.50–1.10)
GFR calc Af Amer: 9 mL/min — ABNORMAL LOW (ref >90)
GFR calc non Af Amer: 8 mL/min — ABNORMAL LOW (ref >90)
Glucose, Bld: 145 mg/dL — ABNORMAL HIGH (ref 70–99)
Phosphorus: 3.8 mg/dL (ref 2.3–4.6)
Potassium: 3.9 mEq/L (ref 3.5–5.1)
Sodium: 141 mEq/L (ref 135–145)

## 2012-08-20 MED ORDER — DARBEPOETIN ALFA-POLYSORBATE 60 MCG/0.3ML IJ SOLN
INTRAMUSCULAR | Status: AC
  Administered 2012-08-20: 60 ug via INTRAVENOUS
  Filled 2012-08-20: qty 0.3

## 2012-08-20 MED ORDER — PARICALCITOL 5 MCG/ML IV SOLN
INTRAVENOUS | Status: AC
  Administered 2012-08-20: 1 ug via INTRAVENOUS
  Filled 2012-08-20: qty 1

## 2012-08-20 NOTE — Progress Notes (Signed)
Patient ID: Diane [name], female   DOB: 06-Oct-1935, 77 y.o.   MRN: [id number]  [facility] Progress Note    Subjective:   Up walking the hall and feels better   Objective:   BP 107/58  Pulse 102  Temp 99.9 F (37.7 C) (Oral)  Resp 20  Ht 5\' 2"  (1.575 m)  Wt 52.164 kg (115 lb)  BMI 21.03 kg/m2  SpO2 94%  Physical Exam: NWG:NFAOZ, elderly WF walking in NAD HYQ:MVHQI Ext- no edema  Labs: BMET  Lab 08/20/12 0532 08/19/12 0450 08/18/12 0615 08/17/12 0545 08/16/12 0350 08/15/12 1643 08/15/12 1640 08/15/12 0905 08/15/12 0335  NA 141 141 145 144 147* 155* -- 153* --  K 3.9 4.0 3.7 4.9 4.8 5.0 -- 4.8 --  CL 105 106 106 116* 116* 125* -- 122* --  CO2 22 24 26  17* 18* -- -- 18* 15*  GLUCOSE 145* 129* 112* 117* 134* 140* -- 132* --  BUN 57* 41* 23 50* 35* 29* -- 25* --  CREATININE 4.80* 4.32* 3.18* 5.13* 4.27* 3.70* 4.05* -- --  ALBUMIN 2.8* 2.7* -- -- -- -- -- -- --  CALCIUM 9.6 9.5 9.2 8.9 8.8 -- -- 9.2 9.2  PHOS 3.8 3.2 -- -- -- -- -- -- --   CBC  Lab 08/20/12 0532 08/19/12 0450 08/18/12 0615 08/16/12 0350  WBC 9.1 9.0 9.1 12.4*  NEUTROABS -- -- -- --  HGB 10.2* 10.9* 10.3* 9.8*  HCT 31.0* 33.1* 31.9* 30.2*  MCV 92.8 91.7 92.2 94.7  PLT 155 138* 128* 87*    @IMGRELPRIORS @ Medications:      . aspirin EC  325 mg Oral Daily  . atorvastatin  40 mg Oral q1800  . clorazepate  3.75 mg Oral QHS  . darbepoetin (ARANESP) injection - DIALYSIS  60 mcg Intravenous Q Thu-HD  . docusate sodium  200 mg Oral Daily  . gabapentin  200 mg Oral QHS  . hydrOXYzine  50 mg Oral QHS  . metoprolol tartrate  12.5 mg Oral BID  . multivitamin  1 tablet Oral QHS  . pantoprazole  40 mg Oral Daily  . paricalcitol  1 mcg Intravenous Q T,Th,Sa-HD  . sevelamer  800 mg Oral Q breakfast  . sodium chloride  3 mL Intravenous Q12H     Assessment/ Plan:   1. Tachycardia- HR was up to 132 this am and patient c/o SOB. Will defer to CT surgery and cardiology to adjust meds.    2. S/P CABG x 5 (08/14/12)- pod #5, stable off vent  3. Hypernatremia- improved with po intake and after HD.  4. ESRD/CKD- usual hd tts. Will get back on schedule today.  5. Htn/volume- On Metoprolol 12.5 mg bid po. No vol excess by xray or exam. May need to increase dose to help with tachycardia, will defer to cards  6. Anemia - on q 2 week IV Fe, will dose x 1 q weekly in hospital. Giving Aranesp in place of outpt EPO.  7. Metabolic bone disease- cont binder w meals, no vit D  8. Peripheral neuropathy - continue neurontin 200mg  hs  9. Hx bipolar d/o - takes Tranxene 3.75 mg at night, "every night" per pt. Watch for signs of w/d.  10. Dispo- pt reports that she would like written instructions on what she can do to help herself get better because she has trouble remembering things on her own.  Sy Saintjean A 08/20/2012, 8:53 AM

## 2012-08-20 NOTE — Progress Notes (Signed)
6 Days Post-Op Procedure(s) (LRB):  CORONARY ARTERY BYPASS GRAFTING (CABG) (N/A)  ENDOVEIN HARVEST OF GREATER SAPHENOUS VEIN   Subjective: Walked in hall with ST up to 128  Objective: Vital signs in last 24 hours: Temp:  [97.1 F (36.2 C)-99.9 F (37.7 C)] 99.9 F (37.7 C) (10/31 0444) Pulse Rate:  [75-112] 102  (10/31 0444) Resp:  [16-20] 20  (10/31 0444) BP: (96-117)/(51-64) 107/58 mmHg (10/31 0444) SpO2:  [94 %-97 %] 94 % (10/31 0444) Weight:  [52.164 kg (115 lb)] 52.164 kg (115 lb) (10/31 0444) Weight change: 0.264 kg (9.3 oz) Last BM Date: 08/19/12 Intake/Output from previous day: -1900 10/30 0701 - 10/31 0700 In: 600 [P.O.:600] Out: 2500 [Urine:2500] Intake/Output this shift:    PE: General:alert sitting on side of bed reading paper, plesant affect Heart:S1S2 RRR Lungs:clear without rales or rhonchi Abd:+ BS, soft, non tender Ext:no edema    Lab Results:  [facility] 08/20/12 0532 08/19/12 0450  WBC 9.1 9.0  HGB 10.2* 10.9*  HCT 31.0* 33.1*  PLT 155 138*   BMET  Basename 08/20/12 0532 08/19/12 0450  NA 141 141  K 3.9 4.0  CL 105 106  CO2 22 24  GLUCOSE 145* 129*  BUN 57* 41*  CREATININE 4.80* 4.32*  CALCIUM 9.6 9.5   Hepatic Function Panel  Basename 08/20/12 0532  PROT --  ALBUMIN 2.8*  AST --  ALT --  ALKPHOS --  BILITOT --  BILIDIR --  IBILI --    EKG: Orders placed during the hospital encounter of 08/12/12  . EKG 12-LEAD  . EKG 12-LEAD  . EKG 12-LEAD  . EKG 12-LEAD  . EKG 12-LEAD  . EKG 12-LEAD    Studies/Results: No results found.  Medications: I have reviewed medications.    Marland Kitchen aspirin EC  325 mg Oral Daily  . atorvastatin  40 mg Oral q1800  . clorazepate  3.75 mg Oral QHS  . darbepoetin (ARANESP) injection - DIALYSIS  60 mcg Intravenous Q Thu-HD  . docusate sodium  200 mg Oral Daily  . gabapentin  200 mg Oral QHS  . hydrOXYzine  50 mg Oral QHS  . metoprolol tartrate  12.5 mg Oral BID  . multivitamin  1 tablet Oral QHS    . pantoprazole  40 mg Oral Daily  . paricalcitol  1 mcg Intravenous Q T,Th,Sa-HD  . sevelamer  800 mg Oral Q breakfast  . sodium chloride  3 mL Intravenous Q12H    Assessment/Plan: Principal Problem:  *Cardiovascular stress test abnormal and chest pain with tachycardia Active Problems:  CAD (coronary artery disease): Severe three vessel  ESRD (end stage renal disease) on dialysis  Knee pain, acute, right  S/P CABG x 5, 08/15/12, LIMA-LAD;VG-diag;VG-OM; seq VG-PDA,PLA   Anemia,chronic and acute post op  PLAN: for dialysis today, continuing aranesp for now for anemia Continues with S. Tach on Lopressor 12.5 BID,  BP 107/58  difficult to increase with dialysis  Please note:  Pt did not tolerate Lipitor as outpatient and does not wish to take.  Her symptom of sharp brief one time only leg pain is atypical, but she is adamant.    LOS: 8 days   INGOLD,LAURA R 08/20/2012, 10:48 AM   Patient seen and examined. Agree with assessment and plan. Long discussion with pt about the importance of aggressive lipid lowering treatment.  She now agrees to stay on atorvastatin since she has been tolerating it so far. I will follow along as outpatient. For dialysis later today.  Lennette Bihari, MD, [facility] 08/20/2012 12:29 PM

## 2012-08-20 NOTE — Progress Notes (Signed)
CARDIAC REHAB PHASE I   PRE:  Rate/Rhythm: 131 ST at sink bathing    BP: sitting 130/70    SaO2: 96 RA  MODE:  Ambulation: 350 ft   POST:  Rate/Rhythm: 128 ST    BP: sitting 124/70     SaO2: 97 RA  Bathed, brushed teeth and hair all before walking. Doing well. HR still elevated. Used. RW with x1 short rest stop. Did not want to go further. To recliner.  [id number]  Harriet Masson CES, ACSM

## 2012-08-20 NOTE — Progress Notes (Signed)
6 Days Post-Op Procedure(s) (LRB): CORONARY ARTERY BYPASS GRAFTING (CABG) (N/A) ENDOVEIN HARVEST OF GREATER SAPHENOUS VEIN (Bilateral) Subjective:  Diane [name] has no complaints this morning.  She is scheduled to have HD today. +BM  Objective: Vital signs in last 24 hours: Temp:  [97.1 F (36.2 C)-99.9 F (37.7 C)] 99.9 F (37.7 C) (10/31 0444) Pulse Rate:  [75-140] 102  (10/31 0444) Cardiac Rhythm:  [-] Sinus tachycardia (10/30 2031) Resp:  [16-20] 20  (10/31 0444) BP: (96-117)/(51-64) 107/58 mmHg (10/31 0444) SpO2:  [94 %-97 %] 94 % (10/31 0444) Weight:  [115 lb (52.164 kg)] 115 lb (52.164 kg) (10/31 0444)   Intake/Output from previous day: 10/30 0701 - 10/31 0700 In: 600 [P.O.:600] Out: 2500 [Urine:2500]  General appearance: alert, cooperative and no distress Heart: regular rate and rhythm Lungs: clear to auscultation bilaterally Abdomen: soft, non-tender; bowel sounds normal; no masses,  no organomegaly Extremities: edema trace Wound: clean and dry  Lab Results:  [facility] 08/20/12 0532 08/19/12 0450  WBC 9.1 9.0  HGB 10.2* 10.9*  HCT 31.0* 33.1*  PLT 155 138*   BMET:  Basename 08/20/12 0532 08/19/12 0450  NA 141 141  K 3.9 4.0  CL 105 106  CO2 22 24  GLUCOSE 145* 129*  BUN 57* 41*  CREATININE 4.80* 4.32*  CALCIUM 9.6 9.5    PT/INR: No results found for this basename: LABPROT,INR in the last 72 hours ABG    Component Value Date/Time   PHART 7.355 08/15/2012 0540   HCO3 17.9* 08/15/2012 0540   TCO2 19 08/15/2012 1643   ACIDBASEDEF 7.0* 08/15/2012 0540   O2SAT 99.0 08/15/2012 0540   CBG (last 3)   Basename 08/18/12 0629 08/17/12 2120 08/17/12 1701  GLUCAP 114* 125* 79    Assessment/Plan: S/P Procedure(s) (LRB): CORONARY ARTERY BYPASS GRAFTING (CABG) (N/A) ENDOVEIN HARVEST OF GREATER SAPHENOUS VEIN (Bilateral)  1.  CV-NSR, Tachycardic, Borderline Hypotenstion, started on Low Dose Metoprolol yesterday 12.5mg  BID, will titrate as tolerated 2.  Pulm- no acute issues 3. Renal- CKD, HD today nephrology following 4. Deconditioning- PT recs SNF 5. Chronic Anemia- on Aransep 6. Dispo- patient for HD today, will titrate Lopressor as tolerated, plan for d/c to SNF tomorrow    LOS: 8 days    Raford Pitcher, Denny Peon 08/20/2012

## 2012-08-20 NOTE — Discharge Summary (Addendum)
[address]            [place] [address]          [phone]      Diane [name] 05/30/1935 77 y.o. [id number]  08/12/2012   Diane Borne, MD  c/p CAD   HPI:  This is a 77 y.o. 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 She has been on hemodialysis since February of 2013. She recently began to have some sternal chest pressure and hypotension on hemodialysis. She states she has had some episodes of chest heaviness at home while working around her house. She was started on Imdur were and has had no symptoms of chest discomfort since that time. She had an abnormal nuclear perfusion scan on 08/05/2012 and therefore was admitted this hospitalization for cardiac catheterization for further evaluation and treatment of presumed coronary artery disease/angina. Past Medical History   Diagnosis  Date   .  Anemia in chronic kidney disease(285.21)    .  Bipolar disorder, unspecified    .  Iron deficiency anemia, unspecified    .  Secondary hyperparathyroidism (of renal origin)    .  Restless legs syndrome (RLS)    .  Unspecified hypertensive kidney disease with chronic kidney disease stage V or end stage renal disease    .  End stage renal disease  11/2011 first HD     Etiology - interstitial nephritis from lithium use   .  Personal history of colonic polyps    .  Diverticulosis of colon (without mention of hemorrhage)  2006     Colonoscopy Dr. Randa [name]   .  Coronary artery disease    .  Anginal pain     Past Surgical History   Procedure  Date   .  Arteriovenous graft placement  11/2010     left upper - [facility] Dr. Arbie Cookey   .  Arteriovenous graft placement  12/2009     left lower - [facility] Dr. Arbie Cookey   .  Av fistula placement  07/2009     right upper AVF Dr. Arbie Cookey   .  Av fistula placement  01/2002     right lower Dr. Arbie Cookey   .  Cardiac catheterization   08/12/2012   .  Abdominal hysterectomy     History reviewed. No pertinent family history.  Social History: reports that she has never smoked. She has never used smokeless tobacco. She reports that she does not drink alcohol or use illicit drugs.  Allergies:  Allergies   Allergen  Reactions   .  Sulfa Antibiotics  Other (See Comments)     Migraine   .  Tape  Rash     Can only use paper tape    Medications:  I have reviewed the patient's current medications.  Prior to Admission:  Prescriptions prior to admission   Medication  Sig  Dispense  Refill   .  acetaminophen (TYLENOL) 500 MG tablet  Take 1,000 mg by mouth every 6 (six) hours as needed. For pain     .  b complex-vitamin c-folic acid (NEPHRO-VITE) 0.8 MG TABS  Take 0.8 mg by mouth daily.     .  clorazepate (TRANXENE) 3.75 MG tablet  Take 3.75 mg by mouth daily as needed. For anxiety     .  gabapentin (NEURONTIN) 100 MG capsule  Take 200 mg by mouth at bedtime.     .  hydrOXYzine (VISTARIL) 25 MG capsule  Take 50 mg by mouth at bedtime.     .  isosorbide mononitrate (IMDUR) 30 MG 24 hr tablet  Take 30 mg by mouth daily.     Marland Kitchen  Ketotifen Fumarate (ALAWAY OP)  Place 1 drop into both eyes 2 (two) times daily as needed. For allergies     .  metoprolol tartrate (LOPRESSOR) 25 MG tablet  Take 25 mg by mouth 2 (two) times daily. Non dialysis days 2 tabs twice a day. tue thur sat are dialysis days 2 twice daily     .  polyethylene glycol (MIRALAX / GLYCOLAX) packet  Take 17 g by mouth daily as needed. For constipation     .  sevelamer (RENAGEL) 800 MG tablet  Take 800 mg by mouth 2 (two) times daily.     .  nitroGLYCERIN (NITROSTAT) 0.4 MG SL tablet  Place 0.4 mg under the tongue every 5 (five) minutes as needed. For chest pain        Hospital Course:  The patient was admitted by the [facility] cardiology group and underwent cardiac catheterization on 08/12/2012 by Dr. Aleen Campi. Findings were notable for significant three-vessel  coronary artery disease. LM: nl  LAD: 30 - 40% ostial, 99% superior branch of Dx1 with [place] 2 flow, 90% LAD post Dx1  LCX; 80% OM1  RCA: 70 - 80% prox, 50% mid, 80% PDA at ostium  Mod LVH, EF 60%  Aortic arch performed, see dictation. Due to these findings cardiac surgical consultation was obtained with Diane [name] M.D. who evaluated the patient and her studies and agreed with recommendations to proceed with surgical coronary artery revascularization. She was also seen in preoperative consultation by the nephrologist who managed her kidney issues including dialysis throughout the hospitalization. The patient was medically stabilized and on 08/14/2012 felt to be stable for proceeding with surgery.  Procedure:  OPERATIVE REPORT  PREOPERATIVE DIAGNOSIS: Severe multivessel coronary artery disease.  POSTOPERATIVE DIAGNOSIS: Severe multivessel coronary artery disease.  OPERATIVE PROCEDURE: Median sternotomy, extracorporeal circulation,  coronary artery bypass graft surgery x5 using a left internal mammary  artery graft to the left anterior descending coronary artery, with a  saphenous vein graft to the diagonal branch of the LAD, a saphenous vein  graft to the obtuse marginal branch of the left circumflex coronary  artery, and a sequential saphenous vein graft to the posterior  descending and posterolateral branches of the right coronary artery.  Endoscopic vein harvesting from both sides.  ATTENDING SURGEON: Diane [name], M.D.  ASSISTANT: Pauline Good, PA-C.  ANESTHESIA: General endotracheal. The patient remained  hemodynamically stable and was transported to the SICU in guarded, but  stable condition.  Postoperative hospital course: She has maintained stable hemodynamics. She was weaned from the ventilator without significant difficulty. Hemodialysis has been managed by the nephrology service. Medical conditions related to renal failure were also managed by the nephrologists. She had  postoperative volume overload but this has stabilized.  She has acute on chronic anemia and this has stabilized. She is treated with Aranesp as well. She has had some sinus tachycardia but no other cardiac dysrhythmias. She is currently on low-dose beta blocker which is being monitored closely as she has a low relative blood pressure. The patient is tolerating gradually increasing activities using standard protocol is limited by her chronic medical conditions and frail status. It  is agreed at this time that she would benefit from further rehabilitation in the setting of a skilled nursing facility. Incisions are healing well without evidence of infection. Oxygen has been weaned and she maintains adequate saturations on room air. Currently her status is felt to be tentatively stable for transfer to skilled nursing facility in the next 1- 2 days pending ongoing reevaluation of her recovery.      Basename 08/21/12 0448 08/20/12 0532  NA 143 141  K 3.4* 3.9  CL 102 105  CO2 30 22  GLUCOSE 109* 145*  BUN 22 57*  CALCIUM 9.7 9.6    Basename 08/21/12 0448 08/20/12 0532  WBC 8.8 9.1  HGB 10.3* 10.2*  HCT 31.9* 31.0*  PLT 162 155   No results found for this basename: INR:2 in the last 72 hours   Discharge Instructions:  The patient is discharged to home with extensive instructions on wound care and progressive ambulation.  They are instructed not to drive or perform any heavy lifting until returning to see the physician in his office.  Discharge Diagnosis:  c/p CAD  Secondary Diagnosis: Patient Active Problem List  Diagnosis  . CAD (coronary artery disease): Severe three vessel  . ESRD (end stage renal disease) on dialysis  . Knee pain, acute, right  . S/P CABG x 5, 08/15/12, LIMA-LAD;VG-diag;VG-OM; seq VG-PDA,PLA   . Cardiovascular stress test abnormal and chest pain with tachycardia  . Anemia,chronic and acute post op   Past Medical History  Diagnosis Date  . Anemia in chronic  kidney disease(285.21)   . Bipolar disorder, unspecified   . Iron deficiency anemia, unspecified   . Secondary hyperparathyroidism (of renal origin)   . Restless legs syndrome (RLS)   . Unspecified hypertensive kidney disease with chronic kidney disease stage V or end stage renal disease   . 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 Personal history of colonic polyps   . Diverticulosis of colon (without mention of hemorrhage) 2006    Colonoscopy Dr. Randa [name]  . Coronary artery disease   . Anginal pain   . S/P CABG x 5, 08/15/12, LIMA-LAD;VG-diag;VG-OM; seq VG-PDA,PLA  08/18/2012       Sam[facility], [facility]  Home Medication Instructions [id number]   Printed on:08/20/12 1358  Medication Information                    clorazepate (TRANXENE) 3.75 MG tablet Take 3.75 mg by mouth daily as needed. For anxiety           hydrOXYzine (VISTARIL) 25 MG capsule Take 50 mg by mouth at bedtime.           gabapentin (NEURONTIN) 100 MG capsule Take 200 mg by mouth at bedtime.           polyethylene glycol (MIRALAX / GLYCOLAX) packet Take 17 g by mouth daily as needed. For constipation           acetaminophen (TYLENOL) 500 MG tablet Take 1,000 mg by mouth every 6 (six) hours as needed. For pain           b complex-vitamin c-folic acid (NEPHRO-VITE) 0.8 MG TABS Take 0.8 mg by mouth daily.           sevelamer (RENAGEL) 800 MG tablet Take 800 mg by mouth 2 (two) times daily.           Ketotifen Fumarate (ALAWAY OP) Place 1 drop  into both eyes 2 (two) times daily as needed. For allergies           aspirin EC 325 MG EC tablet Take 1 tablet (325 mg total) by mouth daily.           atorvastatin (LIPITOR) 40 MG tablet Take 1 tablet (40 mg total) by mouth daily at 6 PM.           darbepoetin (ARANESP) 60 MCG/0.3ML SOLN Inject 0.3 mLs (60 mcg total) into the vein every Thursday with hemodialysis.           olopatadine (PATANOL) 0.1  % ophthalmic solution Place 1 drop into both eyes 2 (two) times daily as needed (for allergies).           oxyCODONE (OXY IR/ROXICODONE) 5 MG immediate release tablet Take 1 tablet (5 mg total) by mouth every 4 (four) hours as needed.            Notation: The patient is on a statin. She is currently on low-dose beta blocker (lopressor 12.5 mg po BID) which is being monitored closely for low relative blood pressure. She is not felt to be a candidate for ACE inhibitor at this time.   Follow-up Information    Follow up with Diane Borne, MD. On 09/15/2012. (Appointment is at 1:00pm)    Contact information:   [address] [employer] [address] [place] [address] [phone]       Follow up with [facility]. On 09/15/2012. (Please get chest xray at 12:00 prior to appointment with Dr. Laneta Simmers)    Contact information:   [address] [place] [address]       Follow up with Lennette Bihari, MD. On 09/15/2012. (3:15pm)    Contact information:   [address] [place] [address] [phone]             Disposition: For discharge to skilled nursing facility  Patient's condition is Juliette Alcide, PA-C 08/21/2012  11:20 AM

## 2012-08-21 LAB — CBC
HCT: 31.9 % — ABNORMAL LOW (ref 36.0–46.0)
MCHC: 32.3 g/dL (ref 30.0–36.0)
MCV: 92.7 fL (ref 78.0–100.0)
RDW: 14.3 % (ref 11.5–15.5)
WBC: 8.8 10*3/uL (ref 4.0–10.5)

## 2012-08-21 LAB — RENAL FUNCTION PANEL
BUN: 22 mg/dL (ref 6–23)
Calcium: 9.7 mg/dL (ref 8.4–10.5)
Creatinine, Ser: 2.72 mg/dL — ABNORMAL HIGH (ref 0.50–1.10)
Glucose, Bld: 109 mg/dL — ABNORMAL HIGH (ref 70–99)
Phosphorus: 3.1 mg/dL (ref 2.3–4.6)

## 2012-08-21 MED ORDER — METOPROLOL TARTRATE 25 MG PO TABS: 25.0000 mg | ORAL_TABLET | Freq: Two times a day (BID) | ORAL | Status: DC

## 2012-08-21 MED ORDER — METOPROLOL TARTRATE 25 MG PO TABS
25.0000 mg | ORAL_TABLET | Freq: Two times a day (BID) | ORAL | Status: DC
  Administered 2012-08-21: 25 mg via ORAL
  Filled 2012-08-21 (×2): qty 1

## 2012-08-21 MED ORDER — METOPROLOL TARTRATE 25 MG PO TABS: 12.5000 mg | ORAL_TABLET | Freq: Two times a day (BID) | ORAL | Status: DC

## 2012-08-21 NOTE — Progress Notes (Signed)
Physical Therapy Treatment Patient Details Name: Diane [name] MRN: [id number] DOB: [birth year] Today's Date: 08/21/2012 Time: 5784-6962 PT Time Calculation (min): 23 min  PT Assessment / Plan / Recommendation Comments on Treatment Session  Pt s/p CABG and is also on HD T,TH, SAT.  Pt making steady progress.    Follow Up Recommendations  Post acute inpatient     Does the patient have the potential to tolerate intense rehabilitation  No, Recommend SNF  Barriers to Discharge        Equipment Recommendations  Rolling walker with 5" wheels;3 in 1 bedside comode    Recommendations for Other Services    Frequency Min 3X/week   Plan Discharge plan remains appropriate;Frequency remains appropriate    Precautions / Restrictions Precautions Precautions: Sternal;Fall   Pertinent Vitals/Pain HR 125-135    Mobility  Transfers Sit to Stand: 4: Min guard;With upper extremity assist;From toilet Stand to Sit: 4: Min guard;With upper extremity assist;To bed Details for Transfer Assistance: Cues to minimize use of arms. Ambulation/Gait Ambulation/Gait Assistance: 4: Min guard;4: Min Environmental consultant (Feet): 275 Feet Assistive device: Rolling walker Ambulation/Gait Assistance Details: Incr assist needed when challenged with head turns. Gait Pattern: Step-through pattern;Decreased stride length Gait velocity: decr    Exercises     PT Diagnosis:    PT Problem List:   PT Treatment Interventions:     PT Goals Acute Rehab PT Goals PT Goal Formulation: With patient PT Goal: Sit to Stand - Progress: Progressing toward goal PT Goal: Stand to Sit - Progress: Progressing toward goal PT Goal: Stand - Progress: Progressing toward goal PT Goal: Ambulate - Progress: Progressing toward goal  Visit Information  Last PT Received On: 08/21/12 Assistance Needed: +1    Subjective Data  Subjective: Pt eager to leave hospital.   Cognition  Overall Cognitive Status:  Impaired Area of Impairment: Memory Arousal/Alertness: Awake/alert Orientation Level: Appears intact for tasks assessed Behavior During Session: [facility] for tasks performed Memory: Decreased recall of precautions    Balance  Static Standing Balance Static Standing - Balance Support: No upper extremity supported;During functional activity Static Standing - Level of Assistance: 5: Stand by assistance  End of Session PT - End of Session Activity Tolerance: Patient tolerated treatment well Patient left: in bed;with call bell/phone within reach   GP     [facility] 08/21/2012, 9:51 AM  [facility] PT [phone]

## 2012-08-21 NOTE — Clinical Social Work Note (Signed)
CSW notified that Pt is cleared for dc by PA and MD. CSW forwarded dc summary and AVS to SNF [facility]. CSW will arranged non-emergency ambulance for transport. Pt's spouse will meet her at SNF.    Frederico Hamman, LCSW [phone]

## 2012-08-21 NOTE — Progress Notes (Signed)
[id number] Cardiac Rehab Completed discharge with pt and husband. They voice understanding. Pt declines Outpt. CRP, not interested.

## 2012-08-21 NOTE — Progress Notes (Signed)
D/c instructions given to pt regarding f/u care and appointments, home medications, s/s of when to notify MD and diet/activity restrictions.  Pt verbalized understanding with all questions answered and pt acknowledged receipt.  IV and telemetry removed from pt.  Awaiting EMS arrival for transportation.

## 2012-08-21 NOTE — Progress Notes (Signed)
Patient [name] Up to 150 [name] When up moving around . Pt. Voiced no complaints. H.R,. Down to 120 when in bed. And slowly down to the 90's. Cont. To monitor patient and Rhythm.

## 2012-08-21 NOTE — Progress Notes (Signed)
Patient ID: Diane [name], female   DOB: 1935/06/12, 77 y.o.   MRN: [id number]  [facility] Progress Note    Subjective:   Feels good but still with chest discomfort   Objective:   BP 118/56  Pulse 135  Temp 98.6 F (37 C) (Oral)  Resp 20  Ht 5\' 2"  (1.575 m)  Wt 51.168 kg (112 lb 12.9 oz)  BMI 20.63 kg/m2  SpO2 96%  Physical Exam: Gen:WD elderly WF inNAD WUJ:WJXBJ, no rub Resp:decreased BS at bases YNW:GNFAOZ Ext:no edema  Labs: BMET  Lab 08/21/12 0448 08/20/12 0532 08/19/12 0450 08/18/12 0615 08/17/12 0545 08/16/12 0350 08/15/12 1643 08/15/12 0905  NA 143 141 141 145 144 147* 155* --  K 3.4* 3.9 4.0 3.7 4.9 4.8 5.0 --  CL 102 105 106 106 116* 116* 125* --  CO2 30 22 24 26  17* 18* -- 18*  GLUCOSE 109* 145* 129* 112* 117* 134* 140* --  BUN 22 57* 41* 23 50* 35* 29* --  CREATININE 2.72* 4.80* 4.32* 3.18* 5.13* 4.27* 3.70* --  ALBUMIN 2.8* 2.8* 2.7* -- -- -- -- --  CALCIUM 9.7 9.6 9.5 9.2 8.9 8.8 -- 9.2  PHOS 3.1 3.8 3.2 -- -- -- -- --   CBC  Lab 08/21/12 0448 08/20/12 0532 08/19/12 0450 08/18/12 0615  WBC 8.8 9.1 9.0 9.1  NEUTROABS -- -- -- --  HGB 10.3* 10.2* 10.9* 10.3*  HCT 31.9* 31.0* 33.1* 31.9*  MCV 92.7 92.8 91.7 92.2  PLT 162 155 138* 128*    @IMGRELPRIORS @ Medications:      . aspirin EC  325 mg Oral Daily  . atorvastatin  40 mg Oral q1800  . clorazepate  3.75 mg Oral QHS  . darbepoetin (ARANESP) injection - DIALYSIS  60 mcg Intravenous Q Thu-HD  . docusate sodium  200 mg Oral Daily  . gabapentin  200 mg Oral QHS  . hydrOXYzine  50 mg Oral QHS  . metoprolol tartrate  25 mg Oral BID  . multivitamin  1 tablet Oral QHS  . pantoprazole  40 mg Oral Daily  . paricalcitol  1 mcg Intravenous Q T,Th,Sa-HD  . sevelamer  800 mg Oral Q breakfast  . sodium chloride  3 mL Intravenous Q12H  . DISCONTD: metoprolol tartrate  12.5 mg Oral BID     Assessment/ Plan:   1. Tachycardia- HR was up but better this am.  cardiology to adjust  meds.  2. S/P CABG x 5 (08/14/12)- pod #6, stable off vent  3. Hypernatremia- improved with po intake and after HD.  4. ESRD/CKD- usual hd tts. Will get back on schedule today.  5. Htn/volume- increased Metoprolol 25 mg bid po. No vol excess by xray or exam. May need to increase dose to help with tachycardia, will defer to cards  6. Anemia - on q 2 week IV Fe, will dose x 1 q weekly in hospital. Giving Aranesp in place of outpt EPO.  7. Metabolic bone disease- cont binder w meals, no vit D  8. Peripheral neuropathy - continue neurontin 200mg  hs  9. Hx bipolar d/o - takes Tranxene 3.75 mg at night, "every night" per pt. Watch for signs of w/d. 10. Dispo- per patient she is to be discharged today to [facility].  Will have her continue with TTS schedule and f/u at [facility] tomorrow. Kavonte Bearse A 08/21/2012, 10:44 AM

## 2012-08-21 NOTE — Progress Notes (Addendum)
7 Days Post-Op Procedure(s) (LRB): CORONARY ARTERY BYPASS GRAFTING (CABG) (N/A) ENDOVEIN HARVEST OF GREATER SAPHENOUS VEIN (Bilateral) Subjective:  Diane [name] states she is tired this morning and has some soreness.  She states the hallways were noisy last night and she kept waking up. + BM  Objective: Vital signs in last 24 hours: Temp:  [97 F (36.1 C)-98.7 F (37.1 C)] 98.6 F (37 C) (11/01 0600) Pulse Rate:  [102-119] 110  (11/01 0600) Cardiac Rhythm:  [-] Sinus tachycardia (10/31 2000) Resp:  [13-21] 20  (11/01 0600) BP: (78-118)/(46-69) 118/56 mmHg (11/01 0600) SpO2:  [96 %-97 %] 96 % (11/01 0600) Weight:  [112 lb 12.8 oz (51.166 kg)-117 lb 8.1 oz (53.3 kg)] 112 lb 12.9 oz (51.168 kg) (11/01 0600)  Intake/Output from previous day: 10/31 0701 - 11/01 0700 In: 600 [P.O.:600] Out: 416 [Urine:600]  General appearance: alert, cooperative and no distress Neurologic: intact Heart: regular rate and rhythm Lungs: clear to auscultation bilaterally Abdomen: soft, non-tender; bowel sounds normal; no masses,  no organomegaly Extremities: edema trace Wound: clean and dry  Lab Results:  [facility] 08/21/12 0448 08/20/12 0532  WBC 8.8 9.1  HGB 10.3* 10.2*  HCT 31.9* 31.0*  PLT 162 155   BMET:  Basename 08/21/12 0448 08/20/12 0532  NA 143 141  K 3.4* 3.9  CL 102 105  CO2 30 22  GLUCOSE 109* 145*  BUN 22 57*  CREATININE 2.72* 4.80*  CALCIUM 9.7 9.6    PT/INR: No results found for this basename: LABPROT,INR in the last 72 hours ABG    Component Value Date/Time   PHART 7.355 08/15/2012 0540   HCO3 17.9* 08/15/2012 0540   TCO2 19 08/15/2012 1643   ACIDBASEDEF 7.0* 08/15/2012 0540   O2SAT 99.0 08/15/2012 0540   CBG (last 3)  No results found for this basename: GLUCAP:3 in the last 72 hours  Assessment/Plan: S/P Procedure(s) (LRB): CORONARY ARTERY BYPASS GRAFTING (CABG) (N/A) ENDOVEIN HARVEST OF GREATER SAPHENOUS VEIN (Bilateral)  1. CV- continued episodes of ST,  pressure increased today will increase Lopressor to 25mg  BID  2. Pulm- no acute issues, continue IS 3. Renal- CKD, continue Dialysis at Discharge, T,TH, Sat 4. Deconditioning- PT recs SNF 5. Chronic Anemia- continue Aranesp 6. Dispo- patient doing well, will increase Lopressor to 25mg  BID, which was her home dose and plan for d/c to SNF today pending bed availability   LOS: 9 days    BARRETT, ERIN 08/21/2012    Chart reviewed, patient examined, agree with above.

## 2012-08-21 NOTE — Progress Notes (Signed)
7 Days Post-Op Procedure(s) (LRB):  CORONARY ARTERY BYPASS GRAFTING (CABG) (N/A)  ENDOVEIN HARVEST OF GREATER SAPHENOUS VEIN   Subjective: Still with occasional ST up to 128, but drops back to < 100 when seated. Tired & Sore - did not sleep well HD yesterday & ready to go today.  Objective: Vital signs in last 24 hours: Temp:  [97 F (36.1 C)-98.7 F (37.1 C)] 98.6 F (37 C) (11/01 0600) Pulse Rate:  [102-135] 135  (11/01 0936) Resp:  [13-21] 20  (11/01 0600) BP: (78-118)/(46-69) 118/56 mmHg (11/01 0600) SpO2:  [96 %-97 %] 96 % (11/01 0600) Weight:  [51.166 kg (112 lb 12.8 oz)-53.3 kg (117 lb 8.1 oz)] 51.168 kg (112 lb 12.9 oz) (11/01 0600) Weight change: 1.136 kg (2 lb 8.1 oz) Last BM Date: 08/20/12 Intake/Output from previous day: -1900 10/31 0701 - 11/01 0700 In: 600 [P.O.:600] Out: 416 [Urine:600] Intake/Output this shift: Total I/O In: 240 [P.O.:240] Out: -   PE: General:alert sitting on side of bed reading paper, plesant affect Heart:S1S2 RRR Lungs:clear without rales or rhonchi, non-labored Abd:+ BS, soft, non tender Ext:no edema, ~1+ pulses.    Lab Results:  [facility] 08/21/12 0448 08/20/12 0532  WBC 8.8 9.1  HGB 10.3* 10.2*  HCT 31.9* 31.0*  PLT 162 155   BMET  Basename 08/21/12 0448 08/20/12 0532  NA 143 141  K 3.4* 3.9  CL 102 105  CO2 30 22  GLUCOSE 109* 145*  BUN 22 57*  CREATININE 2.72* 4.80*  CALCIUM 9.7 9.6   Hepatic Function Panel  Basename 08/21/12 0448  PROT --  ALBUMIN 2.8*  AST --  ALT --  ALKPHOS --  BILITOT --  BILIDIR --  IBILI --  Studies/Results: No results found.  Medications: I have reviewed medications.    Marland Kitchen aspirin EC  325 mg Oral Daily  . atorvastatin  40 mg Oral q1800  . clorazepate  3.75 mg Oral QHS  . darbepoetin (ARANESP) injection - DIALYSIS  60 mcg Intravenous Q Thu-HD  . docusate sodium  200 mg Oral Daily  . gabapentin  200 mg Oral QHS  . hydrOXYzine  50 mg Oral QHS  . metoprolol tartrate  25 mg Oral  BID  . multivitamin  1 tablet Oral QHS  . pantoprazole  40 mg Oral Daily  . paricalcitol  1 mcg Intravenous Q T,Th,Sa-HD  . sevelamer  800 mg Oral Q breakfast  . sodium chloride  3 mL Intravenous Q12H  . DISCONTD: metoprolol tartrate  12.5 mg Oral BID    Assessment/Plan: Principal Problem:  *Cardiovascular stress test abnormal and chest pain with tachycardia Active Problems:  CAD (coronary artery disease): Severe three vessel  ESRD (end stage renal disease) on dialysis  Knee pain, acute, right  S/P CABG x 5, 08/15/12, LIMA-LAD;VG-diag;VG-OM; seq VG-PDA,PLA   Anemia,chronic and acute post op  HD yesterday.  Tachycardiac is difficult to manage -- was on 25 mg BID Lopressor on HD days & 50mg  BID on non-HD days.  Agree with re-starting this regimen for d/c.  Plan is d/c to SNF for Rehab today. I have signed the FL2.  ROV Appt scheduled with Dr. Tresa Endo.  Marykay Lex, M.D., M.S. [facility] [address] [place], [place]  [address]  [phone] Pager # [phone] 08/21/2012 11:46 AM

## 2012-09-11 ENCOUNTER — Other Ambulatory Visit: Payer: Self-pay | Admitting: Surgery

## 2012-09-11 DIAGNOSIS — I2510 Atherosclerotic heart disease of native coronary artery without angina pectoris: Secondary | ICD-10-CM

## 2012-09-15 ENCOUNTER — Ambulatory Visit
Admission: RE | Admit: 2012-09-15 | Discharge: 2012-09-15 | Disposition: A | Discharge status: Home / Self Care | Payer: Medicare Other | Source: Ambulatory Visit | Attending: Surgery | Admitting: Surgery

## 2012-09-15 ENCOUNTER — Ambulatory Visit (INDEPENDENT_AMBULATORY_CARE_PROVIDER_SITE_OTHER): Payer: Self-pay | Admitting: Surgery

## 2012-09-15 ENCOUNTER — Encounter: Payer: Self-pay | Admitting: Surgery

## 2012-09-15 VITALS — BP 114/74 | HR 85 | Resp 18 | Ht 62.0 in | Wt 112.0 lb

## 2012-09-15 DIAGNOSIS — Z951 Presence of aortocoronary bypass graft: Secondary | ICD-10-CM

## 2012-09-15 DIAGNOSIS — I2510 Atherosclerotic heart disease of native coronary artery without angina pectoris: Secondary | ICD-10-CM

## 2012-09-15 NOTE — Progress Notes (Signed)
[address]            [place] [address]          [phone]      HPI:  Patient returns for routine postoperative follow-up having undergone coronary bypass graft surgery x5 on 08/14/2012. The patient's early postoperative recovery while in the hospital was notable for an uncomplicated postoperative course. She was discharged to [facility] and has now been home for the past 2 weeks. Since hospital discharge the patient reports she has been feeling fairly well. She has been going to dialysis without problems. Her energy level is gradually improving. She denies any chest pain or shortness of breath.   Current Outpatient Prescriptions  Medication Sig Dispense Refill  . acetaminophen (TYLENOL) 500 MG tablet Take 1,000 mg by mouth every 6 (six) hours as needed. For pain      . aspirin EC 325 MG EC tablet Take 1 tablet (325 mg total) by mouth daily.  30 tablet    . atorvastatin (LIPITOR) 40 MG tablet Take 1 tablet (40 mg total) by mouth daily at 6 PM.  30 tablet  1  . b complex-vitamin c-folic acid (NEPHRO-VITE) 0.8 MG TABS Take 0.8 mg by mouth daily.      . clorazepate (TRANXENE) 3.75 MG tablet Take 3.75 mg by mouth daily as needed. For anxiety      . darbepoetin (ARANESP) 60 MCG/0.3ML SOLN Inject 0.3 mLs (60 mcg total) into the vein every Thursday with hemodialysis.  4.2 mL    . gabapentin (NEURONTIN) 100 MG capsule Take 200 mg by mouth at bedtime.      . hydrOXYzine (ATARAX/VISTARIL) 50 MG tablet Take 25 mg by mouth every 8 (eight) hours as needed.       . hydrOXYzine (VISTARIL) 25 MG capsule Take 50 mg by mouth at bedtime.      Marland Kitchen Ketotifen Fumarate (ALAWAY OP) Place 1 drop into both eyes 2 (two) times daily as needed. For allergies      . metoprolol tartrate (LOPRESSOR) 25 MG tablet Take 25 mg by mouth 2 (two) times daily. 1/2 tablet (12.5 mg) po BID      . NITROSTAT 0.4 MG SL tablet Place 0.4 mg under the tongue every 5 (five) minutes as needed.        Marland Kitchen olopatadine (PATANOL) 0.1 % ophthalmic solution Place 1 drop into both eyes 2 (two) times daily as needed (for allergies).  5 mL    . oxyCODONE (OXY IR/ROXICODONE) 5 MG immediate release tablet Take 1 tablet (5 mg total) by mouth every 4 (four) hours as needed.  30 tablet  0  . polyethylene glycol (MIRALAX / GLYCOLAX) packet Take 17 g by mouth daily as needed. For constipation      . sevelamer (RENAGEL) 800 MG tablet Take 800 mg by mouth 2 (two) times daily.        Physical Exam: BP 114/74  Pulse 85  Resp 18  Ht 5\' 2"  (1.575 m)  Wt 112 lb (50.803 kg)  BMI 20.49 kg/m2  SpO2 97% She looks well. Cardiac exam shows a regular rate and rhythm with normal heart sounds. Her lung exam is clear. The chest incision is healing well and the sternum is stable. Her leg incision is healing well and there is no peripheral edema.  Diagnostic Tests:  *RADIOLOGY REPORT*   Clinical Data: Coronary  artery disease.   CHEST - 2 VIEW   Comparison: August 16, 2012.   Findings: Sternotomy wires are noted.  Elevated left hemidiaphragm is again noted.  Cardiomediastinal silhouette appears normal.  No acute pulmonary disease is noted.  Bony thorax is intact.   IMPRESSION: No acute cardiopulmonary abnormality seen.     Original Report Authenticated By: Lupita Raider.,  M.D.   Impression:  Overall she is making very good recovery following her surgery. I told her she could return to driving a car but should not lift anything heavier than 10 pounds for a total of 3 months from the date of surgery.  Plan:  She will continue to followup with Dr. Tresa Endo and will contact me if she develops any problems with her incisions.

## 2013-01-30 ENCOUNTER — Other Ambulatory Visit (HOSPITAL_COMMUNITY): Payer: Self-pay | Admitting: Nephrology

## 2013-01-30 DIAGNOSIS — N186 End stage renal disease: Secondary | ICD-10-CM

## 2013-01-31 ENCOUNTER — Other Ambulatory Visit (HOSPITAL_COMMUNITY): Payer: Self-pay | Admitting: Nephrology

## 2013-01-31 ENCOUNTER — Ambulatory Visit (HOSPITAL_COMMUNITY)
Admission: RE | Admit: 2013-01-31 | Discharge: 2013-01-31 | Disposition: A | Discharge status: Home / Self Care | Payer: Medicare Other | Source: Ambulatory Visit | Attending: Nephrology | Admitting: Nephrology

## 2013-01-31 ENCOUNTER — Encounter (HOSPITAL_COMMUNITY): Payer: Self-pay

## 2013-01-31 DIAGNOSIS — F319 Bipolar disorder, unspecified: Secondary | ICD-10-CM | POA: Insufficient documentation

## 2013-01-31 DIAGNOSIS — I2510 Atherosclerotic heart disease of native coronary artery without angina pectoris: Secondary | ICD-10-CM | POA: Insufficient documentation

## 2013-01-31 DIAGNOSIS — G2581 Restless legs syndrome: Secondary | ICD-10-CM | POA: Insufficient documentation

## 2013-01-31 DIAGNOSIS — T82898A Other specified complication of vascular prosthetic devices, implants and grafts, initial encounter: Secondary | ICD-10-CM | POA: Insufficient documentation

## 2013-01-31 DIAGNOSIS — Z9109 Other allergy status, other than to drugs and biological substances: Secondary | ICD-10-CM | POA: Insufficient documentation

## 2013-01-31 DIAGNOSIS — N186 End stage renal disease: Secondary | ICD-10-CM

## 2013-01-31 DIAGNOSIS — N2581 Secondary hyperparathyroidism of renal origin: Secondary | ICD-10-CM | POA: Insufficient documentation

## 2013-01-31 DIAGNOSIS — D631 Anemia in chronic kidney disease: Secondary | ICD-10-CM | POA: Insufficient documentation

## 2013-01-31 DIAGNOSIS — N039 Chronic nephritic syndrome with unspecified morphologic changes: Secondary | ICD-10-CM | POA: Insufficient documentation

## 2013-01-31 DIAGNOSIS — Y832 Surgical operation with anastomosis, bypass or graft as the cause of abnormal reaction of the patient, or of later complication, without mention of misadventure at the time of the procedure: Secondary | ICD-10-CM | POA: Insufficient documentation

## 2013-01-31 DIAGNOSIS — I871 Compression of vein: Secondary | ICD-10-CM | POA: Insufficient documentation

## 2013-01-31 DIAGNOSIS — Z882 Allergy status to sulfonamides status: Secondary | ICD-10-CM | POA: Insufficient documentation

## 2013-01-31 DIAGNOSIS — Z7982 Long term (current) use of aspirin: Secondary | ICD-10-CM | POA: Insufficient documentation

## 2013-01-31 DIAGNOSIS — I82619 Acute embolism and thrombosis of superficial veins of unspecified upper extremity: Secondary | ICD-10-CM | POA: Insufficient documentation

## 2013-01-31 LAB — POCT I-STAT 4, (NA,K, GLUC, HGB,HCT)
Glucose, Bld: 85 mg/dL (ref 70–99)
Hemoglobin: 13.6 g/dL (ref 12.0–15.0)
Potassium: 5.4 mEq/L — ABNORMAL HIGH (ref 3.5–5.1)
Sodium: 146 mEq/L — ABNORMAL HIGH (ref 135–145)

## 2013-01-31 MED ORDER — MIDAZOLAM HCL 2 MG/2ML IJ SOLN
INTRAMUSCULAR | Status: AC | PRN
  Administered 2013-01-31: 1 mg via INTRAVENOUS

## 2013-01-31 MED ORDER — FENTANYL CITRATE 0.05 MG/ML IJ SOLN
INTRAMUSCULAR | Status: AC | PRN
  Administered 2013-01-31: 50 ug via INTRAVENOUS

## 2013-01-31 MED ORDER — ALTEPLASE 100 MG IV SOLR
2.0000 mg | Freq: Once | INTRAVENOUS | Status: AC
  Administered 2013-01-31: 2 mg
  Filled 2013-01-31: qty 2

## 2013-01-31 MED ORDER — FENTANYL CITRATE 0.05 MG/ML IJ SOLN
INTRAMUSCULAR | Status: AC
Start: 2013-01-31 — End: 2013-01-31
  Filled 2013-01-31: qty 4

## 2013-01-31 MED ORDER — HEPARIN SODIUM (PORCINE) 1000 UNIT/ML IJ SOLN
INTRAMUSCULAR | Status: AC
  Administered 2013-01-31: 3000 [IU]
  Filled 2013-01-31: qty 1

## 2013-01-31 MED ORDER — IOHEXOL 300 MG/ML  SOLN
100.0000 mL | Freq: Once | INTRAMUSCULAR | Status: AC | PRN
  Administered 2013-01-31: 50 mL via INTRAVENOUS

## 2013-01-31 MED ORDER — SODIUM POLYSTYRENE SULFONATE 15 GM/60ML PO SUSP
15.0000 g | Freq: Once | ORAL | Status: AC
  Administered 2013-01-31: 15 g via ORAL
  Filled 2013-01-31: qty 60

## 2013-01-31 MED ORDER — MIDAZOLAM HCL 2 MG/2ML IJ SOLN
INTRAMUSCULAR | Status: AC
  Filled 2013-01-31: qty 4

## 2013-01-31 NOTE — ED Notes (Signed)
Potassium results called to PA Emelia Loron order given for 15g Keyexalate to be taken post procedure and patient to go to dialysis on Monday.

## 2013-01-31 NOTE — Procedures (Signed)
Successful declot of left upper arm graft.  Balloon angioplasty of left venous anastomosis. No immediate complication.

## 2013-01-31 NOTE — H&P (Signed)
Chief Complaint:Clotted dialysis graft Referring Physician:Mattingly HPI: Diane [name] is an 78 y.o. female with ESRD who has a (L)UE AVG. She last used this 4/8 and went for treatment yesterday, but her graft was found to be clotted. She has never had any prior issues. Referred for declot procedure. PMHx and meds reviewed.  Past Medical History:  Past Medical History  Diagnosis Date  . Anemia in chronic kidney disease(285.21)   . Bipolar disorder, unspecified   . Iron deficiency anemia, unspecified   . Secondary hyperparathyroidism (of renal origin)   . Restless legs syndrome (RLS)   . Unspecified hypertensive kidney disease with chronic kidney disease stage V or end stage renal disease   . 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 Personal history of colonic polyps   . Diverticulosis of colon (without mention of hemorrhage) 2006    Colonoscopy Dr. Randa Evens  . Coronary artery disease   . Anginal pain   . S/P CABG x 5, 08/15/12, LIMA-LAD;VG-diag;VG-OM; seq VG-PDA,PLA  08/18/2012    Past Surgical History:  Past Surgical History  Procedure Laterality Date  . Arteriovenous graft placement  11/2010    left upper - AV[facility] Dr. Arbie Cookey  . Arteriovenous graft placement  12/2009    left lower - [facility] Dr. Arbie Cookey  . Av fistula placement  07/2009    right upper AVF Dr. Arbie Cookey  . Av fistula placement  01/2002    right lower Dr. Arbie Cookey  . Cardiac catheterization  08/12/2012  . Abdominal hysterectomy    . Coronary artery bypass graft  08/14/2012    Procedure: CORONARY ARTERY BYPASS GRAFTING (CABG);  Surgeon: [name], MD;  Location[facility] OR;  Service: Open Heart Surgery;  Laterality: N/A;  times five, using left internal mammary  . Endovein harvest of greater saphenous vein  08/14/2012    Procedure: ENDOVEIN HARVEST OF GREATER SAPHENOUS VEIN;  Surgeon: [name], MD;  Location: [facility] OR;  Service: Open Heart Surgery;  Laterality:  Bilateral;    Family History: History reviewed. No pertinent family history.  Social History:  reports that she has never smoked. She has never used smokeless tobacco. She reports that she does not drink alcohol or use illicit drugs.  Allergies:  Allergies  Allergen Reactions  . Sulfa Antibiotics Other (See Comments)    Migraine   . Tape Rash    Can only use paper tape     Medications: Current Outpatient Prescriptions   Medication  Sig  Dispense  Refill   .  acetaminophen (TYLENOL) 500 MG tablet  Take 1,000 mg by mouth every 6 (six) hours as needed. For pain     .  aspirin EC 325 MG EC tablet  Take 1 tablet (325 mg total) by mouth daily.  30 tablet    .  atorvastatin (LIPITOR) 40 MG tablet  Take 1 tablet (40 mg total) by mouth daily at 6 PM.  30 tablet  1   .  b complex-vitamin c-folic acid (NEPHRO-VITE) 0.8 MG TABS  Take 0.8 mg by mouth daily.     .  clorazepate (TRANXENE) 3.75 MG tablet  Take 3.75 mg by mouth daily as needed. For anxiety     .  darbepoetin (ARANESP) 60 MCG/0.3ML SOLN  Inject 0.3 mLs (60 mcg total) into the vein every Thursday with hemodialysis.  4.2 mL    .  gabapentin (NEURONTIN) 100 MG capsule  Take 200 mg  by mouth at bedtime.     .  hydrOXYzine (ATARAX/VISTARIL) 50 MG tablet  Take 25 mg by mouth every 8 (eight) hours as needed.     .  hydrOXYzine (VISTARIL) 25 MG capsule  Take 50 mg by mouth at bedtime.     Marland Kitchen  Ketotifen Fumarate (ALAWAY OP)  Place 1 drop into both eyes 2 (two) times daily as needed. For allergies     .  metoprolol tartrate (LOPRESSOR) 25 MG tablet  Take 25 mg by mouth 2 (two) times daily. 1/2 tablet (12.5 mg) po BID     .  NITROSTAT 0.4 MG SL tablet  Place 0.4 mg under the tongue every 5 (five) minutes as needed.     Marland Kitchen  olopatadine (PATANOL) 0.1 % ophthalmic solution  Place 1 drop into both eyes 2 (two) times daily as needed (for allergies).  5 mL    .  oxyCODONE (OXY IR/ROXICODONE) 5 MG immediate release tablet  Take 1 tablet (5 mg total) by  mouth every 4 (four) hours as needed.  30 tablet  0   .  polyethylene glycol (MIRALAX / GLYCOLAX) packet  Take 17 g by mouth daily as needed. For constipation     .  sevelamer (RENAGEL) 800 MG tablet  Take 800 mg by mouth       Please HPI for pertinent positives, otherwise complete 10 system ROS negative.  Physical Exam: There were no vitals taken for this visit. There is no weight on file to calculate BMI.   General Appearance:  Alert, cooperative, no distress, appears stated age  Head:  Normocephalic, without obvious abnormality, atraumatic  ENT: Unremarkable  Neck: Supple, symmetrical, trachea midline  Lungs:   Clear to auscultation bilaterally, no w/r/r, respirations unlabored without use of accessory muscles.  Chest Wall:  No tenderness or deformity  Heart:  Regular rate and rhythm, S1, S2 normal, no murmur, rub or gallop. Carotids 2+ without bruit.  Extremities: (L)UE AVG without pulse.thrill. Hand warm  Pulses: 2+ and symmetric  Neurologic: Normal affect, no gross deficits.   No results found for this or any previous visit (from the past 48 hour(s)). No results found.  Assessment/Plan Clotted (L)UE AVG For thrombosis/thrombectomy, possible angioplasty, poss HD catheter. Explained procedure, risks, complications, use of sedation. Consent signed in chart  Brayton El PA-C 01/31/2013, 7:47 AM

## 2013-03-25 ENCOUNTER — Telehealth: Payer: Self-pay | Admitting: Cardiovascular Disease

## 2013-03-25 MED ORDER — METOPROLOL TARTRATE 25 MG PO TABS: ORAL_TABLET | ORAL | Status: DC

## 2013-03-25 NOTE — Telephone Encounter (Signed)
SPOKE TO PATIENT SHE IS TAKING 1/2 TABLET TWICE A DAY EVERYDAY OF THE MONTH. SHE NEED TO HAVE QUANTITY INCREASED. SENT REFILL

## 2013-03-25 NOTE — Telephone Encounter (Signed)
Her dosage was changed for her Metoprolol-She need nee prescription-since her dosage changed-please call to [facility] on [address]!

## 2013-08-11 ENCOUNTER — Ambulatory Visit (INDEPENDENT_AMBULATORY_CARE_PROVIDER_SITE_OTHER): Payer: Medicare Other | Admitting: Cardiovascular Disease

## 2013-08-11 ENCOUNTER — Encounter: Payer: Self-pay | Admitting: Cardiovascular Disease

## 2013-08-11 VITALS — BP 110/68 | HR 68 | Ht 63.0 in | Wt 119.0 lb

## 2013-08-11 DIAGNOSIS — Z951 Presence of aortocoronary bypass graft: Secondary | ICD-10-CM

## 2013-08-11 DIAGNOSIS — N186 End stage renal disease: Secondary | ICD-10-CM

## 2013-08-11 DIAGNOSIS — G629 Polyneuropathy, unspecified: Secondary | ICD-10-CM | POA: Insufficient documentation

## 2013-08-11 DIAGNOSIS — Z992 Dependence on renal dialysis: Secondary | ICD-10-CM

## 2013-08-11 DIAGNOSIS — D649 Anemia, unspecified: Secondary | ICD-10-CM

## 2013-08-11 DIAGNOSIS — I2510 Atherosclerotic heart disease of native coronary artery without angina pectoris: Secondary | ICD-10-CM

## 2013-08-11 DIAGNOSIS — G609 Hereditary and idiopathic neuropathy, unspecified: Secondary | ICD-10-CM

## 2013-08-11 NOTE — Progress Notes (Signed)
Patient ID: Diane [name], female   DOB: [birth year], 78 y.o.   MRN: [id number]     HPI: Diane [name] is a 78 y.o. female who presents to the office for 8 months cardiology evaluation.  Diane [name] has a history of end-stage renal disease felt to be due to promote lithium toxicity and has been on dialysis since April 2013. In October 2013 after experiencing episode of chest pain and tachycardia with dialysis she ultimately underwent cardiac catheterization after nuclear study demonstrated LAD ischemia. Catheterization revealed severe multivessel CAD and 08/15/2012 she underwent CABG revascularization surgery x5 by Dr. Lavinia Sharps with a LIMA to the LAD, vein to diagonal, vein to the obtuse marginal, and sequential vein to the PDA and PLA vessel. Additional problems include anemia. She also has hyperlipidemia. She also has significant peripheral neuropathy which seems to be worse on her dialysis days. She has done well since her bypass surgery. She denies any tachycardia palpitations. She denies any recurrent chest pain symptomatology.  Past Medical History  Diagnosis Date  . Anemia in chronic kidney disease(285.21)   . Bipolar disorder, unspecified   . Iron deficiency anemia, unspecified   . Secondary hyperparathyroidism (of renal origin)   . Restless legs syndrome (RLS)   . Unspecified hypertensive kidney disease with chronic kidney disease stage V or end stage renal disease(403.91)   . 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 Personal history of colonic polyps   . Diverticulosis of colon (without mention of hemorrhage) 2006    Colonoscopy Dr. Randa Evens  . Coronary artery disease   . Anginal pain   . S/P CABG x 5, 08/15/12, LIMA-LAD;VG-diag;VG-OM; seq VG-PDA,PLA  08/18/2012    Past Surgical History  Procedure Laterality Date  . Arteriovenous graft placement  11/2010    left upper - [facility] Dr. Arbie Cookey  . Arteriovenous graft  placement  12/2009    left lower - [facility] Dr. Arbie Cookey  . Av fistula placement  07/2009    right upper AVF Dr. Arbie Cookey  . Av fistula placement  01/2002    right lower Dr. Arbie Cookey  . Cardiac catheterization  08/12/2012  . Abdominal hysterectomy    . Coronary artery bypass graft  08/14/2012    Procedure: CORONARY ARTERY BYPASS GRAFTING (CABG);  Surgeon: [name], MD;  Location: [facility] OR;  Service: Open Heart Surgery;  Laterality: N/A;  times five, using left internal mammary  . Endovein harvest of greater saphenous vein  08/14/2012    Procedure: ENDOVEIN HARVEST OF GREATER SAPHENOUS VEIN;  Surgeon: [name], MD;  Location: [facility] OR;  Service: Open Heart Surgery;  Laterality: Bilateral;    Allergies  Allergen Reactions  . Sulfa Antibiotics Other (See Comments)    Migraine   . Latex Rash  . Tape Rash    Can only use paper tape     Current Outpatient Prescriptions  Medication Sig Dispense Refill  . acetaminophen (TYLENOL) 500 MG tablet Take 1,000 mg by mouth every 6 (six) hours as needed. For pain      . aspirin 81 MG tablet Take 81 mg by mouth daily.      Marland Kitchen b complex-vitamin c-folic acid (NEPHRO-VITE) 0.8 MG TABS Take 0.8 mg by mouth daily.      . calcium acetate (PHOSLO) 667 MG capsule Take 1 capsule by mouth daily.      . clorazepate (TRANXENE) 3.75 MG tablet Take 3.75 mg by mouth daily  as needed. For anxiety      . darbepoetin (ARANESP) 60 MCG/0.3ML SOLN Inject 0.3 mLs (60 mcg total) into the vein every Thursday with hemodialysis.  4.2 mL    . gabapentin (NEURONTIN) 100 MG capsule Take 200 mg by mouth 2 (two) times daily.       . hydrOXYzine (ATARAX/VISTARIL) 50 MG tablet Take 25 mg by mouth every 8 (eight) hours as needed.       Marland Kitchen Ketotifen Fumarate (ALAWAY OP) Place 1 drop into both eyes 2 (two) times daily as needed. For allergies      . metoprolol tartrate (LOPRESSOR) 25 MG tablet 1/2 tablet (12.5 mg) by mouth twice a day  30 tablet  6  . mometasone (ELOCON) 0.1 % cream       .  NITROSTAT 0.4 MG SL tablet Place 0.4 mg under the tongue every 5 (five) minutes as needed.       Marland Kitchen olopatadine (PATANOL) 0.1 % ophthalmic solution Place 1 drop into both eyes 2 (two) times daily as needed (for allergies).  5 mL    . polyethylene glycol (MIRALAX / GLYCOLAX) packet Take 17 g by mouth daily as needed. For constipation      . sevelamer (RENAGEL) 800 MG tablet Take 800 mg by mouth 2 (two) times daily.       No current facility-administered medications for this visit.    History   Social History  . Marital Status: Married    Spouse Name: N/A    Number of Children: N/A  . Years of Education: N/A   Occupational History  . Not on file.   Social History Main Topics  . Smoking status: Never Smoker   . Smokeless tobacco: Never Used  . Alcohol Use: No  . Drug Use: No  . Sexual Activity: No   Other Topics Concern  . Not on file   Social History Narrative  . No narrative on file   Social history is notable in that she is married to my patient Bertina Guthridge. She has 3 children 6 grandchildren 5 great-grandchildren. She does not exercise routinely. No tobacco or alcohol use.  History reviewed. No pertinent family history.  ROS is negative for fevers, chills or night sweats.  She denies visual symptoms. She seems to diet tolerate dialysis well without hypotension recently. She denies tachycardia palpitations. She denies wheezing. He denies PND or orthopnea. She denies cough or increased sputum production. There is no recurrent episodes of angina type symptoms. She denies change in bowel or bladder habits. She does have a history of anemia. She denies blood loss in her stool or urine. She denies claudication. She does have issues with peripheral neuropathy with significant the burning particularly the night of her dialysis. She denies tremors. She denies having diabetes. Other comprehensive 12 point system review is negative.  PE BP 110/68  Pulse 68  Ht 5\' 3"  (1.6 m)  Wt  119 lb (53.978 kg)  BMI 21.09 kg/m2  General: Alert, oriented, no distress.  Skin: normal turgor, no rashes HEENT: Normocephalic, atraumatic. Pupils round and reactive; sclera anicteric;no lid lag.  Nose without nasal septal hypertrophy Mouth/Parynx benign; Mallinpatti scale 2 Neck: No JVD, no carotid briuts Lungs: clear to ausculatation and percussion; no wheezing or rales Heart: RRR, s1 s2 normal 1/6 systolic murmur per Abdomen: Very faint bruit in the midepigastric region; soft, nontender; no hepatosplenomehaly, BS+; abdominal aorta nontender and not dilated by palpation. Pulses 2+ Extremities: no clubbing cyanosis or edema, Homan's  sign negative  Neurologic: grossly nonfocal Psychologic: normal affect and mood.  ECG: Sinus rhythm with one isolated atrial premature complex. Normal intervals.  LABS:  BMET    Component Value Date/Time   NA 146* 01/31/2013 0800   K 5.4* 01/31/2013 0800   CL 102 08/21/2012 0448   CO2 30 08/21/2012 0448   GLUCOSE 85 01/31/2013 0800   BUN 22 08/21/2012 0448   CREATININE 2.72* 08/21/2012 0448   CALCIUM 9.7 08/21/2012 0448   GFRNONAA 16* 08/21/2012 0448   GFRAA 19* 08/21/2012 0448     Hepatic Function Panel     Component Value Date/Time   ALBUMIN 2.8* 08/21/2012 0448     CBC    Component Value Date/Time   WBC 8.8 08/21/2012 0448   RBC 3.44* 08/21/2012 0448   HGB 13.6 01/31/2013 0800   HCT 40.0 01/31/2013 0800   PLT 162 08/21/2012 0448   MCV 92.7 08/21/2012 0448   [name] 29.9 08/21/2012 0448   MCHC 32.3 08/21/2012 0448   RDW 14.3 08/21/2012 0448     BNP No results found for this basename: probnp    Lipid Panel  No results found for this basename: chol, trig, hdl, cholhdl, vldl, ldlcalc     RADIOLOGY: No results found.    ASSESSMENT AND PLAN:  Diane [name] is doing well now one year status post CABG revascularization surgery x5 after she was found to have severe multivessel CAD. She remains asymptomatic. She is tolerating her dialysis on  Tuesdays Thursdays and Saturdays and does not appear to be hypotensive. I last saw  she was on lipid-lowering therapy with Lipitor 20 mg. She apparently self discontinued this because of some leg discomfort. I'm recommending repeat laboratory be checked in the fasting state. If her lipids are elevated we may need to consider other alternatives since she does have significant coronary disease and is status post bypass surgery. Her blood pressure today is well controlled at 118/70. Rhythm is stable with only one isolated APC. She is tolerating low dose beta blocker therapy. I will see her in 6 months for cardiology reevaluation.     Lennette Bihari, MD, [facility]  08/11/2013 5:03 PM

## 2013-08-11 NOTE — Patient Instructions (Signed)
Your physician recommends that you schedule a follow-up appointment in: 6 MONTHS. No  Changes were made in your therapy today.

## 2013-08-12 ENCOUNTER — Telehealth: Payer: Self-pay | Admitting: *Deleted

## 2013-08-12 NOTE — Telephone Encounter (Signed)
Pt's husband said he was returning your call

## 2013-08-13 ENCOUNTER — Telehealth: Payer: Self-pay | Admitting: *Deleted

## 2013-08-13 ENCOUNTER — Encounter: Payer: Self-pay | Admitting: Cardiovascular Disease

## 2013-08-13 ENCOUNTER — Other Ambulatory Visit: Payer: Self-pay | Admitting: *Deleted

## 2013-08-13 DIAGNOSIS — Z79899 Other long term (current) drug therapy: Secondary | ICD-10-CM

## 2013-08-13 DIAGNOSIS — E782 Mixed hyperlipidemia: Secondary | ICD-10-CM

## 2013-08-13 NOTE — Telephone Encounter (Signed)
Spoke with patient informing her that since she D/C'd her atorvastatin, Dr. Tresa Endo wants her to get her cholesterol checked. She voiced understanding and requested for me to send her a labslip, however she refuses to go back on Lipitor. Orders placed for patient to have lipid panel drawn.

## 2013-08-23 LAB — COMPREHENSIVE METABOLIC PANEL
AST: 19 U/L (ref 0–37)
Albumin: 4.4 g/dL (ref 3.5–5.2)
Alkaline Phosphatase: 53 U/L (ref 39–117)
Calcium: 9.6 mg/dL (ref 8.4–10.5)
Chloride: 104 mEq/L (ref 96–112)
Glucose, Bld: 106 mg/dL — ABNORMAL HIGH (ref 70–99)
Potassium: 5 mEq/L (ref 3.5–5.3)
Sodium: 144 mEq/L (ref 135–145)
Total Protein: 7 g/dL (ref 6.0–8.3)

## 2013-08-23 LAB — LIPID PANEL
Total CHOL/HDL Ratio: 4.8 Ratio
VLDL: 44 mg/dL — ABNORMAL HIGH (ref 0–40)

## 2013-10-08 ENCOUNTER — Other Ambulatory Visit: Payer: Self-pay | Admitting: *Deleted

## 2013-10-08 ENCOUNTER — Telehealth (HOSPITAL_COMMUNITY): Payer: Self-pay | Admitting: *Deleted

## 2013-10-08 MED ORDER — PITAVASTATIN CALCIUM 2 MG PO TABS: ORAL_TABLET | ORAL | Status: DC

## 2013-10-25 ENCOUNTER — Other Ambulatory Visit: Payer: Self-pay | Admitting: *Deleted

## 2013-10-25 MED ORDER — METOPROLOL TARTRATE 25 MG PO TABS: ORAL_TABLET | ORAL | Status: DC

## 2013-12-12 ENCOUNTER — Encounter (HOSPITAL_COMMUNITY): Payer: Self-pay | Admitting: Emergency Medicine

## 2013-12-12 ENCOUNTER — Emergency Department (HOSPITAL_COMMUNITY): Payer: Medicare Other

## 2013-12-12 ENCOUNTER — Inpatient Hospital Stay (HOSPITAL_COMMUNITY)
Admission: EM | Admit: 2013-12-12 | Discharge: 2013-12-16 | DRG: 871 | Disposition: A | Discharge status: Skilled Nursing Facility | Payer: Medicare Other | Attending: Internal Medicine | Admitting: Internal Medicine

## 2013-12-12 DIAGNOSIS — N186 End stage renal disease: Secondary | ICD-10-CM

## 2013-12-12 DIAGNOSIS — Z91040 Latex allergy status: Secondary | ICD-10-CM

## 2013-12-12 DIAGNOSIS — E875 Hyperkalemia: Secondary | ICD-10-CM | POA: Diagnosis present

## 2013-12-12 DIAGNOSIS — R9439 Abnormal result of other cardiovascular function study: Secondary | ICD-10-CM

## 2013-12-12 DIAGNOSIS — A088 Other specified intestinal infections: Secondary | ICD-10-CM | POA: Diagnosis present

## 2013-12-12 DIAGNOSIS — N2581 Secondary hyperparathyroidism of renal origin: Secondary | ICD-10-CM | POA: Diagnosis present

## 2013-12-12 DIAGNOSIS — I120 Hypertensive chronic kidney disease with stage 5 chronic kidney disease or end stage renal disease: Secondary | ICD-10-CM | POA: Diagnosis present

## 2013-12-12 DIAGNOSIS — E861 Hypovolemia: Secondary | ICD-10-CM | POA: Diagnosis present

## 2013-12-12 DIAGNOSIS — R6521 Severe sepsis with septic shock: Secondary | ICD-10-CM

## 2013-12-12 DIAGNOSIS — G629 Polyneuropathy, unspecified: Secondary | ICD-10-CM

## 2013-12-12 DIAGNOSIS — N039 Chronic nephritic syndrome with unspecified morphologic changes: Secondary | ICD-10-CM

## 2013-12-12 DIAGNOSIS — D649 Anemia, unspecified: Secondary | ICD-10-CM

## 2013-12-12 DIAGNOSIS — Z7982 Long term (current) use of aspirin: Secondary | ICD-10-CM

## 2013-12-12 DIAGNOSIS — A419 Sepsis, unspecified organism: Principal | ICD-10-CM | POA: Diagnosis present

## 2013-12-12 DIAGNOSIS — I959 Hypotension, unspecified: Secondary | ICD-10-CM

## 2013-12-12 DIAGNOSIS — F319 Bipolar disorder, unspecified: Secondary | ICD-10-CM | POA: Diagnosis present

## 2013-12-12 DIAGNOSIS — M25569 Pain in unspecified knee: Secondary | ICD-10-CM

## 2013-12-12 DIAGNOSIS — Z882 Allergy status to sulfonamides status: Secondary | ICD-10-CM

## 2013-12-12 DIAGNOSIS — R5381 Other malaise: Secondary | ICD-10-CM

## 2013-12-12 DIAGNOSIS — Z992 Dependence on renal dialysis: Secondary | ICD-10-CM

## 2013-12-12 DIAGNOSIS — R7309 Other abnormal glucose: Secondary | ICD-10-CM | POA: Diagnosis present

## 2013-12-12 DIAGNOSIS — J111 Influenza due to unidentified influenza virus with other respiratory manifestations: Secondary | ICD-10-CM

## 2013-12-12 DIAGNOSIS — Z79899 Other long term (current) drug therapy: Secondary | ICD-10-CM

## 2013-12-12 DIAGNOSIS — Z951 Presence of aortocoronary bypass graft: Secondary | ICD-10-CM

## 2013-12-12 DIAGNOSIS — Z66 Do not resuscitate: Secondary | ICD-10-CM | POA: Diagnosis present

## 2013-12-12 DIAGNOSIS — D631 Anemia in chronic kidney disease: Secondary | ICD-10-CM | POA: Diagnosis present

## 2013-12-12 DIAGNOSIS — I2510 Atherosclerotic heart disease of native coronary artery without angina pectoris: Secondary | ICD-10-CM

## 2013-12-12 DIAGNOSIS — J101 Influenza due to other identified influenza virus with other respiratory manifestations: Secondary | ICD-10-CM

## 2013-12-12 DIAGNOSIS — G609 Hereditary and idiopathic neuropathy, unspecified: Secondary | ICD-10-CM | POA: Diagnosis present

## 2013-12-12 LAB — BASIC METABOLIC PANEL
BUN: 49 mg/dL — ABNORMAL HIGH (ref 6–23)
CO2: 19 mEq/L (ref 19–32)
Calcium: 9 mg/dL (ref 8.4–10.5)
Chloride: 104 mEq/L (ref 96–112)
Creatinine, Ser: 6.53 mg/dL — ABNORMAL HIGH (ref 0.50–1.10)
GFR calc Af Amer: 6 mL/min — ABNORMAL LOW (ref >90)
GFR calc non Af Amer: 5 mL/min — ABNORMAL LOW (ref >90)
Glucose, Bld: 114 mg/dL — ABNORMAL HIGH (ref 70–99)
Potassium: 4.7 mEq/L (ref 3.7–5.3)
Sodium: 142 mEq/L (ref 137–147)

## 2013-12-12 LAB — CBC WITH DIFFERENTIAL/PLATELET
Basophils Absolute: 0 10*3/uL (ref 0.0–0.1)
Basophils Relative: 0 % (ref 0–1)
Eosinophils Absolute: 0.1 10*3/uL (ref 0.0–0.7)
Eosinophils Relative: 1 % (ref 0–5)
HCT: 33.5 % — ABNORMAL LOW (ref 36.0–46.0)
Hemoglobin: 11 g/dL — ABNORMAL LOW (ref 12.0–15.0)
Lymphocytes Relative: 10 % — ABNORMAL LOW (ref 12–46)
Lymphs Abs: 0.8 10*3/uL (ref 0.7–4.0)
MCH: 31 pg (ref 26.0–34.0)
MCHC: 32.8 g/dL (ref 30.0–36.0)
MCV: 94.4 fL (ref 78.0–100.0)
Monocytes Absolute: 0.5 10*3/uL (ref 0.1–1.0)
Monocytes Relative: 6 % (ref 3–12)
Neutro Abs: 7 10*3/uL (ref 1.7–7.7)
Neutrophils Relative %: 83 % — ABNORMAL HIGH (ref 43–77)
Platelets: 100 10*3/uL — ABNORMAL LOW (ref 150–400)
RBC: 3.55 MIL/uL — ABNORMAL LOW (ref 3.87–5.11)
RDW: 13.3 % (ref 11.5–15.5)
WBC: 8.4 10*3/uL (ref 4.0–10.5)

## 2013-12-12 LAB — URINALYSIS, ROUTINE W REFLEX MICROSCOPIC
Bilirubin Urine: NEGATIVE
Glucose, UA: NEGATIVE mg/dL
Ketones, ur: NEGATIVE mg/dL
Leukocytes, UA: NEGATIVE
Nitrite: NEGATIVE
Protein, ur: 30 mg/dL — AB
Specific Gravity, Urine: 1.007 (ref 1.005–1.030)
Urobilinogen, UA: 0.2 mg/dL (ref 0.0–1.0)
pH: 7.5 (ref 5.0–8.0)

## 2013-12-12 LAB — URINE MICROSCOPIC-ADD ON

## 2013-12-12 LAB — INFLUENZA PANEL BY PCR (TYPE A & B)
H1N1 flu by pcr: NOT DETECTED
Influenza A By PCR: POSITIVE — AB
Influenza B By PCR: NEGATIVE

## 2013-12-12 LAB — CLOSTRIDIUM DIFFICILE BY PCR: Toxigenic C. Difficile by PCR: NEGATIVE

## 2013-12-12 LAB — LACTIC ACID, PLASMA: Lactic Acid, Venous: 1.3 mmol/L (ref 0.5–2.2)

## 2013-12-12 MED ORDER — SODIUM CHLORIDE 0.9 % IV BOLUS (SEPSIS)
500.0000 mL | Freq: Once | INTRAVENOUS | Status: AC
  Administered 2013-12-12: 500 mL via INTRAVENOUS

## 2013-12-12 MED ORDER — OSELTAMIVIR PHOSPHATE 75 MG PO CAPS: 75.0000 mg | ORAL_CAPSULE | Freq: Once | ORAL | Status: DC

## 2013-12-12 MED ORDER — CALCIUM ACETATE 667 MG PO CAPS
667.0000 mg | ORAL_CAPSULE | Freq: Every day | ORAL | Status: DC
  Administered 2013-12-13 – 2013-12-15 (×2): 667 mg via ORAL
  Filled 2013-12-12 (×5): qty 1

## 2013-12-12 MED ORDER — OLOPATADINE HCL 0.1 % OP SOLN
1.0000 [drp] | Freq: Two times a day (BID) | OPHTHALMIC | Status: DC | PRN
  Filled 2013-12-12: qty 5

## 2013-12-12 MED ORDER — PIPERACILLIN-TAZOBACTAM 3.375 G IVPB 30 MIN
3.3750 g | Freq: Once | INTRAVENOUS | Status: AC
  Administered 2013-12-12: 3.375 g via INTRAVENOUS
  Filled 2013-12-12: qty 50

## 2013-12-12 MED ORDER — ASPIRIN 81 MG PO TABS: 81.0000 mg | ORAL_TABLET | Freq: Every day | ORAL | Status: DC

## 2013-12-12 MED ORDER — SODIUM CHLORIDE 0.9 % IV BOLUS (SEPSIS)
1000.0000 mL | Freq: Once | INTRAVENOUS | Status: AC
  Administered 2013-12-12: 1000 mL via INTRAVENOUS

## 2013-12-12 MED ORDER — SODIUM CHLORIDE 0.9 % IV BOLUS (SEPSIS): 1000.0000 mL | Freq: Once | INTRAVENOUS | Status: DC

## 2013-12-12 MED ORDER — HEPARIN SODIUM (PORCINE) 5000 UNIT/ML IJ SOLN
5000.0000 [IU] | Freq: Three times a day (TID) | INTRAMUSCULAR | Status: DC
  Administered 2013-12-12 – 2013-12-15 (×8): 5000 [IU] via SUBCUTANEOUS
  Filled 2013-12-12 (×14): qty 1

## 2013-12-12 MED ORDER — OSELTAMIVIR PHOSPHATE 30 MG PO CAPS
30.0000 mg | ORAL_CAPSULE | ORAL | Status: AC
  Administered 2013-12-12: 30 mg via ORAL
  Filled 2013-12-12: qty 1

## 2013-12-12 MED ORDER — VANCOMYCIN HCL 10 G IV SOLR
1250.0000 mg | INTRAVENOUS | Status: AC
  Administered 2013-12-12: 1250 mg via INTRAVENOUS
  Filled 2013-12-12: qty 1250

## 2013-12-12 MED ORDER — VANCOMYCIN HCL 500 MG IV SOLR: 500.0000 mg | INTRAVENOUS | Status: DC

## 2013-12-12 MED ORDER — ACETAMINOPHEN 325 MG PO TABS
650.0000 mg | ORAL_TABLET | Freq: Once | ORAL | Status: AC
  Administered 2013-12-12: 650 mg via ORAL
  Filled 2013-12-12: qty 2

## 2013-12-12 MED ORDER — SODIUM CHLORIDE 0.9 % IJ SOLN
3.0000 mL | Freq: Two times a day (BID) | INTRAMUSCULAR | Status: DC
  Administered 2013-12-12 – 2013-12-15 (×6): 3 mL via INTRAVENOUS

## 2013-12-12 MED ORDER — OSELTAMIVIR PHOSPHATE 30 MG PO CAPS
30.0000 mg | ORAL_CAPSULE | Freq: Two times a day (BID) | ORAL | Status: DC
  Administered 2013-12-12 – 2013-12-13 (×2): 30 mg via ORAL
  Filled 2013-12-12 (×3): qty 1

## 2013-12-12 MED ORDER — HYDROXYZINE HCL 25 MG PO TABS
50.0000 mg | ORAL_TABLET | Freq: Every evening | ORAL | Status: DC | PRN
  Administered 2013-12-12 – 2013-12-15 (×4): 50 mg via ORAL
  Filled 2013-12-12 (×2): qty 2
  Filled 2013-12-12: qty 1
  Filled 2013-12-12: qty 2

## 2013-12-12 MED ORDER — ASPIRIN 81 MG PO CHEW
81.0000 mg | CHEWABLE_TABLET | Freq: Every day | ORAL | Status: DC
  Administered 2013-12-12 – 2013-12-16 (×5): 81 mg via ORAL
  Filled 2013-12-12 (×5): qty 1

## 2013-12-12 NOTE — H&P (Signed)
[facility] History and Physical  Diane [name] [id number] DOB: 08-23-1935 DOA: 12/12/2013  Referring physician: Virgel Manifold, MD PCP: Donnajean Lopes, MD   Chief Complaint: Fever  HPI: Diane [name] is a 79 y.o. female with past medical history of end stage renal disease, hypertension and CAD. Patient came in to the hospital because of fever and chills. Patient said she was in her usual state of health until Thursday when she started to have running nose, cough soon that followed by fever and chills. Today also she developed 5-6 loose bowel movement so she came into the hospital for further evaluation. In the ED patient is BP went down to the 70s, patient given about 2.5 L of normal saline, fever of 101, no leukocytosis. She seen initially by PCCM and they recommended admission to step down by [facility]. Of note patient missed dialysis yesterday.  Review of Systems:  Constitutional:  fevers and sweats Eyes: negative for irritation, redness and visual disturbance Ears, nose, mouth, throat, and face: negative for earaches, epistaxis, nasal congestion and sore throat Respiratory: Right nose and cough but denies wheezing Cardiovascular: negative for chest pain, dyspnea, lower extremity edema, orthopnea, palpitations and syncope Gastrointestinal: negative for abdominal pain, constipation, diarrhea, melena, nausea and vomiting Genitourinary:negative for dysuria, frequency and hematuria Hematologic/lymphatic: negative for bleeding, easy bruising and lymphadenopathy Musculoskeletal:negative for arthralgias, muscle weakness and stiff joints Neurological: negative for coordination problems, gait problems, headaches and weakness Endocrine: negative for diabetic symptoms including polydipsia, polyuria and weight loss Allergic/Immunologic: negative for anaphylaxis, hay fever and urticaria   Past Medical History  Diagnosis Date  . Anemia in chronic kidney  disease(285.21)   . Bipolar disorder, unspecified   . Iron deficiency anemia, unspecified   . Secondary hyperparathyroidism (of renal origin)   . Restless legs syndrome (RLS)   . Unspecified hypertensive kidney disease with chronic kidney disease stage V or end stage renal disease   . 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 Personal history of colonic polyps   . Diverticulosis of colon (without mention of hemorrhage) 2006    Colonoscopy Dr. Oletta Lamas  . Coronary artery disease   . Anginal pain   . S/P CABG x 5, 08/15/12, LIMA-LAD;VG-diag;VG-OM; seq VG-PDA,PLA  08/18/2012   Past Surgical History  Procedure Laterality Date  . Arteriovenous graft placement  11/2010    left upper - [facility] Dr. Donnetta Hutching  . Arteriovenous graft placement  12/2009    left lower - [facility] Dr. Donnetta Hutching  . Av fistula placement  07/2009    right upper AVF Dr. Donnetta Hutching  . Av fistula placement  01/2002    right lower Dr. Donnetta Hutching  . Cardiac catheterization  08/12/2012  . Abdominal hysterectomy    . Coronary artery bypass graft  08/14/2012    Procedure: CORONARY ARTERY BYPASS GRAFTING (CABG);  Surgeon: Gaye Pollack, MD;  Location: [place];  Service: Open Heart Surgery;  Laterality: N/A;  times five, using left internal mammary  . Endovein harvest of greater saphenous vein  08/14/2012    Procedure: ENDOVEIN HARVEST OF GREATER SAPHENOUS VEIN;  Surgeon: Gaye Pollack, MD;  Location: [place];  Service: Open Heart Surgery;  Laterality: Bilateral;   Social History:  reports that she has never smoked. She has never used smokeless tobacco. She reports that she does not drink alcohol or use illicit drugs.  Allergies  Allergen Reactions  . Sulfa Antibiotics Other (See Comments)  Migraine   . Latex Rash  . Tape Rash    Can only use paper tape     History reviewed. No pertinent family history.   Prior to Admission medications   Medication Sig Start Date End Date Taking?  Authorizing Provider  acetaminophen (TYLENOL) 500 MG tablet Take 1,000 mg by mouth every 6 (six) hours as needed. For pain   Yes Historical Provider, MD  aspirin 81 MG tablet Take 81 mg by mouth daily.   Yes Historical Provider, MD  b complex-vitamin c-folic acid (NEPHRO-VITE) 0.8 MG TABS Take 0.8 mg by mouth daily.   Yes Historical Provider, MD  calcium acetate (PHOSLO) 667 MG capsule Take 1 capsule by mouth daily. 07/05/13  Yes Historical Provider, MD  clorazepate (TRANXENE) 3.75 MG tablet Take 3.75 mg by mouth at bedtime. For anxiety   Yes Historical Provider, MD  gabapentin (NEURONTIN) 100 MG capsule Take 200 mg by mouth 2 (two) times daily.    Yes Historical Provider, MD  hydrOXYzine (ATARAX/VISTARIL) 50 MG tablet Take 50 mg by mouth at bedtime.  09/04/12  Yes Historical Provider, MD  Ketotifen Fumarate (ALAWAY OP) Place 1 drop into both eyes 2 (two) times daily as needed. For allergies   Yes Historical Provider, MD  metoprolol tartrate (LOPRESSOR) 25 MG tablet Take 12.5 mg by mouth 2 (two) times daily.   Yes Historical Provider, MD  mometasone (ELOCON) 0.1 % cream Apply 1 application topically as needed (for itching from tape).  08/10/13  Yes Historical Provider, MD  olopatadine (PATANOL) 0.1 % ophthalmic solution Place 1 drop into both eyes 2 (two) times daily as needed (for allergies). 08/19/12  Yes Erin Barrett, PA-C  polyethylene glycol (MIRALAX / GLYCOLAX) packet Take 17 g by mouth daily as needed. For constipation   Yes Historical Provider, MD  sevelamer (RENAGEL) 800 MG tablet Take 800 mg by mouth 2 (two) times daily.   Yes Historical Provider, MD  NITROSTAT 0.4 MG SL tablet Place 0.4 mg under the tongue every 5 (five) minutes as needed.  07/07/12   Historical Provider, MD   Physical Exam: Filed Vitals:   12/12/13 1600  BP: 96/46  Pulse: 66  Temp:   Resp: 20    BP 96/46  Pulse 66  Temp(Src) 98.9 F (37.2 C) (Oral)  Resp 20  SpO2 100%  General:  Appears calm and  comfortable Eyes: PERRL, normal lids, irises & conjunctiva ENT: grossly normal hearing, lips & tongue Neck: no LAD, masses or thyromegaly Cardiovascular: RRR, no m/r/g. No LE edema. Telemetry: SR, no arrhythmias  Respiratory: CTA bilaterally, no w/r/r. Normal respiratory effort. Abdomen: soft, ntnd Skin: no rash or induration seen on limited exam Musculoskeletal: grossly normal tone BUE/BLE Psychiatric: grossly normal mood and affect, speech fluent and appropriate Neurologic: grossly non-focal.          Labs on Admission:  Basic Metabolic Panel:  Recent Labs Lab 12/12/13 1015  NA 142  K 4.7  CL 104  CO2 19  GLUCOSE 114*  BUN 49*  CREATININE 6.53*  CALCIUM 9.0   Liver Function Tests: No results found for this basename: AST, ALT, ALKPHOS, BILITOT, PROT, ALBUMIN,  in the last 168 hours No results found for this basename: LIPASE, AMYLASE,  in the last 168 hours No results found for this basename: AMMONIA,  in the last 168 hours CBC:  Recent Labs Lab 12/12/13 1015  WBC 8.4  NEUTROABS 7.0  HGB 11.0*  HCT 33.5*  MCV 94.4  PLT 100*  Cardiac Enzymes: No results found for this basename: CKTOTAL, CKMB, CKMBINDEX, TROPONINI,  in the last 168 hours  BNP (last 3 results) No results found for this basename: PROBNP,  in the last 8760 hours CBG: No results found for this basename: GLUCAP,  in the last 168 hours  Radiological Exams on Admission: Dg Chest 2 View  12/12/2013   CLINICAL DATA:  Fever and cough  EXAM: CHEST  2 VIEW  COMPARISON:  09/15/2012  FINDINGS: Left hemidiaphragm remains elevated with basilar atelectasis. Lungs are otherwise clear. No pleural effusion and no pneumothorax. Postoperative changes from CABG. Normal heart size.  IMPRESSION: No active cardiopulmonary disease. Chronic and postoperative changes are noted.   Electronically Signed   By: Maryclare Bean M.D.   On: 12/12/2013 11:40    EKG: Independently reviewed.  Assessment/Plan Principal Problem:    Sepsis Active Problems:   ESRD (end stage renal disease) on dialysis   S/P CABG x 5, 08/15/12, LIMA-LAD;VG-diag;VG-OM; seq VG-PDA,PLA    Influenza A    Sepsis -Septic shock evident from fever of 101, and is BP in the 70s, heart rate probably developed because of beta blockers. -Patient initially seen by PCCM, recommended to hydrate with IV fluids. -Given 2.5 L of fluids so far, I will give one more liter of normal saline. -Check chest x-ray in a.m. in case if he develops infiltrates.  Influenza A -Tested positive for influenza type A. -Started on Tamiflu in the emergency department. -Started on vancomycin and Zosyn, IV antibiotics discontinued as there is no infiltrates. -Blood cultures obtained, the patient continues to spike fevers or low blood pressure Will restart empiric antibiotics.  ESRD -Patient gets dialysis on Tuesday, Thursday and Saturday. -Apparently she missed dialysis on Tuesday, she had a dialysis on Wednesday. -She did not get dialysis yesterday, and spoke with Dr. Lorrene Reid, probably no dialysis today as blood pressure is soft.  History of CAD -Status post CABG, denies any chest pain, aspirin continued.  Code Status: DO NOT RESUSCITATE confirmed in the presence of her daughter at bedside. Family Communication: Plan discussed with the patient in the presence of daughter at bedside. Disposition Plan: Stepdown  Time spent: 70 minutes  [facility] Pager [phone]

## 2013-12-12 NOTE — Progress Notes (Signed)
ANTIBIOTIC CONSULT NOTE - INITIAL  Pharmacy Consult for vancomycin Indication: rule out sepsis  Allergies  Allergen Reactions  . Sulfa Antibiotics Other (See Comments)    Migraine   . Latex Rash  . Tape Rash    Can only use paper tape     Patient Measurements: 54 kg  Vital Signs: Temp: 101 F (38.3 C) (02/22 0926) Temp src: Rectal (02/22 0926) BP: 92/42 mmHg (02/22 0921) Pulse Rate: 87 (02/22 0921) Intake/Output from previous day:   Intake/Output from this shift:    Labs: No results found for this basename: WBC, HGB, PLT, LABCREA, CREATININE,  in the last 72 hours The CrCl is unknown because both a height and weight (above a minimum accepted value) are required for this calculation. No results found for this basename: VANCOTROUGH, VANCOPEAK, VANCORANDOM, GENTTROUGH, GENTPEAK, GENTRANDOM, TOBRATROUGH, TOBRAPEAK, TOBRARND, AMIKACINPEAK, AMIKACINTROU, AMIKACIN,  in the last 72 hours   Microbiology: No results found for this or any previous visit (from the past 720 hour(s)).  Medical History: Past Medical History  Diagnosis Date  . Anemia in chronic kidney disease(285.21)   . Bipolar disorder, unspecified   . Iron deficiency anemia, unspecified   . Secondary hyperparathyroidism (of renal origin)   . Restless legs syndrome (RLS)   . Unspecified hypertensive kidney disease with chronic kidney disease stage V or end stage renal disease   . 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 Personal history of colonic polyps   . Diverticulosis of colon (without mention of hemorrhage) 2006    Colonoscopy Dr. Oletta Lamas  . Coronary artery disease   . Anginal pain   . S/P CABG x 5, 08/15/12, LIMA-LAD;VG-diag;VG-OM; seq VG-PDA,PLA  08/18/2012    Medications:  See PTA medication list  Assessment: 79 y/o female with ESRD on HD TTS who presents to the ED with fever and weakness of the last couple of days. She missed HD yesterday.  Pharmacy consulted to begin vancomycin for sepsis. Zosyn ordered also. Labs are pending. She is febrile and cultures are pending.   Goal of Therapy:  pre-HD vancomycin level 15-25 mcg/ml  Plan:  -Vancomycin 1250 mg IV now then 500 mg IV after each full HD session -Follow-up HD schedule -Monitor clinical progress and culture data  [facility], Pharm.D., BCPS Clinical Pharmacist Pager: [phone] 12/12/2013 9:52 AM

## 2013-12-12 NOTE — ED Notes (Signed)
Attempted to call report.  RN to call back from [phone].

## 2013-12-12 NOTE — ED Notes (Addendum)
To ED via [facility] for eval of fever and weakness over the past cple of days. Pt is a dialysis pt, missed yesterday due to feeling sick. Pt states she has had 2 episodes of diarrhea this am. Pt still urinates regularly. Denies difficulty with urination. Denies SOB or CP. States she has been coughing, nonproductive. No active coughing noted

## 2013-12-12 NOTE — ED Notes (Signed)
Urine culture sent as Add On

## 2013-12-12 NOTE — Consult Note (Signed)
Name: Diane [name] MRN: [id number] DOB: [birth year]    ADMISSION DATE:  12/12/2013 CONSULTATION DATE:  12/12/2013  REFERRING MD :  Dr. Wilson Singer PRIMARY SERVICE: TRH  CHIEF COMPLAINT:  Hypotension   BRIEF PATIENT DESCRIPTION: 79 y/o F who presented to [facility] on 2/22 with hypotension, weakness, diarrhea. Influenza positive.   SIGNIFICANT EVENTS / STUDIES:  2/22 - admit with influenza, hypotension  CULTURES: UA 2/22>>>neg Influenza 2/22>>>positive for A BCx2 2/22>>>  ANTIBIOTICS: - defer to admitting MD Vanco 2/22>>> Zosyn 2/22>>>  HISTORY OF PRESENT ILLNESS: 79 y/o F who presented to [facility] on 2/22 with approximately 2 days of hypotension, weakness, non-productive cough, diarrhea and difficulty with urination.  Blood pressures in ER with systolic averaging in 06'T - one reading with sbp in 70's, lactic acid of 1.3, wbc of 8.4.  CXR clear on admit.  Patient missed HD x1 (2/21) due to weakness / feeling poorly.  She reports hard chills am of admit, runny nose and upper respiratory symptoms.  Flu viral panel positive in ER. PCCM consulted for evaluation of hypotension.   Patient denies headaches, dizziness, chest pain, pain with inspiration, n/v, bloody stool, syncope / pre-syncope.    PAST MEDICAL HISTORY :  Past Medical History  Diagnosis Date  . Anemia in chronic kidney disease(285.21)   . Bipolar disorder, unspecified   . Iron deficiency anemia, unspecified   . Secondary hyperparathyroidism (of renal origin)   . Restless legs syndrome (RLS)   . Unspecified hypertensive kidney disease with chronic kidney disease stage V or end stage renal disease   . 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 Personal history of colonic polyps   . Diverticulosis of colon (without mention of hemorrhage) 2006    Colonoscopy Dr. Oletta Lamas  . Coronary artery disease   . Anginal pain   . S/P CABG x 5, 08/15/12, LIMA-LAD;VG-diag;VG-OM; seq  VG-PDA,PLA  08/18/2012   Past Surgical History  Procedure Laterality Date  . Arteriovenous graft placement  11/2010    left upper - AVGG Dr. Donnetta Hutching  . Arteriovenous graft placement  12/2009    left lower - AVGG Dr. Donnetta Hutching  . Av fistula placement  07/2009    right upper AVF Dr. Donnetta Hutching  . Av fistula placement  01/2002    right lower Dr. Donnetta Hutching  . Cardiac catheterization  08/12/2012  . Abdominal hysterectomy    . Coronary artery bypass graft  08/14/2012    Procedure: CORONARY ARTERY BYPASS GRAFTING (CABG);  Surgeon: Gaye Pollack, MD;  Location: [place];  Service: Open Heart Surgery;  Laterality: N/A;  times five, using left internal mammary  . Endovein harvest of greater saphenous vein  08/14/2012    Procedure: ENDOVEIN HARVEST OF GREATER SAPHENOUS VEIN;  Surgeon: Gaye Pollack, MD;  Location: [place];  Service: Open Heart Surgery;  Laterality: Bilateral;   Prior to Admission medications   Medication Sig Start Date End Date Taking? Authorizing Provider  acetaminophen (TYLENOL) 500 MG tablet Take 1,000 mg by mouth every 6 (six) hours as needed. For pain   Yes Historical Provider, MD  aspirin 81 MG tablet Take 81 mg by mouth daily.   Yes Historical Provider, MD  b complex-vitamin c-folic acid (NEPHRO-VITE) 0.8 MG TABS Take 0.8 mg by mouth daily.   Yes Historical Provider, MD  calcium acetate (PHOSLO) 667 MG capsule Take 1 capsule by mouth daily. 07/05/13  Yes Historical Provider, MD  clorazepate (TRANXENE) 3.75 MG tablet Take 3.75 mg by mouth at bedtime. For anxiety   Yes Historical Provider, MD  gabapentin (NEURONTIN) 100 MG capsule Take 200 mg by mouth 2 (two) times daily.    Yes Historical Provider, MD  hydrOXYzine (ATARAX/VISTARIL) 50 MG tablet Take 50 mg by mouth at bedtime.  09/04/12  Yes Historical Provider, MD  Ketotifen Fumarate (ALAWAY OP) Place 1 drop into both eyes 2 (two) times daily as needed. For allergies   Yes Historical Provider, MD  metoprolol tartrate (LOPRESSOR) 25 MG tablet  Take 12.5 mg by mouth 2 (two) times daily.   Yes Historical Provider, MD  mometasone (ELOCON) 0.1 % cream Apply 1 application topically as needed (for itching from tape).  08/10/13  Yes Historical Provider, MD  olopatadine (PATANOL) 0.1 % ophthalmic solution Place 1 drop into both eyes 2 (two) times daily as needed (for allergies). 08/19/12  Yes Erin Barrett, PA-C  polyethylene glycol (MIRALAX / GLYCOLAX) packet Take 17 g by mouth daily as needed. For constipation   Yes Historical Provider, MD  sevelamer (RENAGEL) 800 MG tablet Take 800 mg by mouth 2 (two) times daily.   Yes Historical Provider, MD  NITROSTAT 0.4 MG SL tablet Place 0.4 mg under the tongue every 5 (five) minutes as needed.  07/07/12   Historical Provider, MD   Allergies  Allergen Reactions  . Sulfa Antibiotics Other (See Comments)    Migraine   . Latex Rash  . Tape Rash    Can only use paper tape     FAMILY HISTORY:  History reviewed. No pertinent family history. SOCIAL HISTORY:  reports that she has never smoked. She has never used smokeless tobacco. She reports that she does not drink alcohol or use illicit drugs.  REVIEW OF SYSTEMS:  See HPI for pertinent positives.   SUBJECTIVE:   VITAL SIGNS: Temp:  [98.9 F (37.2 C)-101 F (38.3 C)] 98.9 F (37.2 C) (02/22 1245) Pulse Rate:  [64-87] 64 (02/22 1430) Resp:  [14-24] 20 (02/22 1430) BP: (75-104)/(34-54) 88/46 mmHg (02/22 1430) SpO2:  [96 %-100 %] 100 % (02/22 1430) HEMODYNAMICS:   VENTILATOR SETTINGS:   INTAKE / OUTPUT: Intake/Output     02/21 0701 - 02/22 0700 02/22 0701 - 02/23 0700   I.V.  2400   Total Intake   2400   Net   +2400          PHYSICAL EXAMINATION: General:  wdwn elderly female in NAD Neuro:  AAOx4, speech clear, MAE HEENT:  Mm pink/moist, no jvd Cardiovascular:  s1s2 rrr, no m/r/g Lungs:  resp's even/non-labored, lungs bilaterally clear Abdomen:  Round/soft,bs x4 active Musculoskeletal:  No acute deformities Skin:  Warm/dry, no  edema.  LUE AVG with +thrill/bruit  LABS:  CBC  Recent Labs Lab 12/12/13 1015  WBC 8.4  HGB 11.0*  HCT 33.5*  PLT 100*   Coag's No results found for this basename: APTT, INR,  in the last 168 hours BMET  Recent Labs Lab 12/12/13 1015  NA 142  K 4.7  CL 104  CO2 19  BUN 49*  CREATININE 6.53*  GLUCOSE 114*   Electrolytes  Recent Labs Lab 12/12/13 1015  CALCIUM 9.0   Sepsis Markers  Recent Labs Lab 12/12/13 1015  LATICACIDVEN 1.3   ABG No results found for this basename: PHART, PCO2ART, PO2ART,  in the last 168 hours Liver Enzymes No results found for this basename: AST, ALT, ALKPHOS, BILITOT, ALBUMIN,  in the last 168 hours Cardiac  Enzymes No results found for this basename: TROPONINI, PROBNP,  in the last 168 hours Glucose No results found for this basename: GLUCAP,  in the last 168 hours  Imaging Dg Chest 2 View  12/12/2013   CLINICAL DATA:  Fever and cough  EXAM: CHEST  2 VIEW  COMPARISON:  09/15/2012  FINDINGS: Left hemidiaphragm remains elevated with basilar atelectasis. Lungs are otherwise clear. No pleural effusion and no pneumothorax. Postoperative changes from CABG. Normal heart size.  IMPRESSION: No active cardiopulmonary disease. Chronic and postoperative changes are noted.   Electronically Signed   By: Maryclare Bean M.D.   On: 12/12/2013 11:40   ASSESSMENT / PLAN:  PULMONARY A: \r/o flu pneumonitis, appears to be able to tolerate volume P:   -pulmonary hygiene -am cxr to ensure no developing infiltrate  CARDIOVASCULAR A:  Hypotension  - mild soft BP's in ER with normal mental status & good Radial Pulses CAD Hypovolemia P:  -NS as ordered -continue gentle volume resuscitation -no need for advanced IV support at this time -hold metoprolol -lactic acid re assuring -she has strong radial pulses, accuracy cuff a concern  RENAL A:   ESRD - on HD Secondary Hyperparathyroidism Makes urine P:   -no emergent need for HD on admit -will  need Nephrology Consult, last HD completed Thurs 2/19  GASTROINTESTINAL A:   Diarrhea, viral induced P:   -see ID -diet as tolerated -no imodium until stool screening negative  HEMATOLOGIC A:   Anemia - in setting of chronic disease P:  -monitor H/H  INFECTIOUS A:   Diarrhea, Rule out Gastroenteritis Influenza just back  = POS P:   -cultures / abx as above -tamiflu x 5 days, renal adjusted -isolate  ENDOCRINE A:   Hyperglycemia  P:   -monitor on BMP  NEUROLOGIC A:   RLS P:   -defer to primary MD  Admit sdu, does not require ICU at this stage, will follow  Noe Gens, NP-C [facility] Pgr: [phone] or [phone]  I have personally obtained a history, examined the patient, evaluated laboratory and imaging results, formulated the assessment and plan and placed orders.  I have fully examined this patient and agree with above findings.    And edited in full  Lavon Paganini. Titus Mould, MD, [place] Pgr: [facility]

## 2013-12-12 NOTE — ED Notes (Signed)
Pt with dialysis fistula in left upper arm. Positive b/t

## 2013-12-12 NOTE — ED Provider Notes (Addendum)
CSN: [id number]     Arrival date & time 12/12/13  0911 History   First MD Initiated Contact with Patient 12/12/13 [phone]     Chief Complaint  Patient presents with  . Weakness  . Fever     (Consider location/radiation/quality/duration/timing/severity/associated sxs/prior Treatment) HPI  79 y.o. female with past medical history of end stage renal disease, hypertension and CAD. Patient missed dialysis yesterday because she was not feeling well. Patient came in to the hospital because of fever and chills. Patient said she was in her usual state of health until Thursday when she started to have running nose, cough soon that followed by fever and chills. Today also she developed 5-6 loose bowel movement so she came into the hospital for further evaluation. She denies any abdominal pain. No vomiting. No sick contacts.  Past Medical History  Diagnosis Date  . Anemia in chronic kidney disease(285.21)   . Bipolar disorder, unspecified   . Iron deficiency anemia, unspecified   . Secondary hyperparathyroidism (of renal origin)   . Restless legs syndrome (RLS)   . Unspecified hypertensive kidney disease with chronic kidney disease stage V or end stage renal disease   . 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 Personal history of colonic polyps   . Diverticulosis of colon (without mention of hemorrhage) 2006    Colonoscopy Dr. Oletta Lamas  . Coronary artery disease   . Anginal pain   . S/P CABG x 5, 08/15/12, LIMA-LAD;VG-diag;VG-OM; seq VG-PDA,PLA  08/18/2012   Past Surgical History  Procedure Laterality Date  . Arteriovenous graft placement  11/2010    left upper - AVGG Dr. Donnetta Hutching  . Arteriovenous graft placement  12/2009    left lower - AVGG Dr. Donnetta Hutching  . Av fistula placement  07/2009    right upper AVF Dr. Donnetta Hutching  . Av fistula placement  01/2002    right lower Dr. Donnetta Hutching  . Cardiac catheterization  08/12/2012  . Abdominal hysterectomy     . Coronary artery bypass graft  08/14/2012    Procedure: CORONARY ARTERY BYPASS GRAFTING (CABG);  Surgeon: Gaye Pollack, MD;  Location: [place];  Service: Open Heart Surgery;  Laterality: N/A;  times five, using left internal mammary  . Endovein harvest of greater saphenous vein  08/14/2012    Procedure: ENDOVEIN HARVEST OF GREATER SAPHENOUS VEIN;  Surgeon: Gaye Pollack, MD;  Location: [place];  Service: Open Heart Surgery;  Laterality: Bilateral;   History reviewed. No pertinent family history. History  Substance Use Topics  . Smoking status: Never Smoker   . Smokeless tobacco: Never Used  . Alcohol Use: No   OB History   Grav Para Term Preterm Abortions TAB SAB Ect Mult Living                 Review of Systems  All systems reviewed and negative, other than as noted in HPI.   Allergies  Sulfa antibiotics; Latex; and Tape  Home Medications   Current Outpatient Rx  Name  Route  Sig  Dispense  Refill  . acetaminophen (TYLENOL) 500 MG tablet   Oral   Take 1,000 mg by mouth every 6 (six) hours as needed. For pain         . aspirin 81 MG tablet   Oral   Take 81 mg by mouth daily.         Marland Kitchen b complex-vitamin c-folic acid (NEPHRO-VITE) 0.8 MG  TABS   Oral   Take 0.8 mg by mouth daily.         . calcium acetate (PHOSLO) 667 MG capsule   Oral   Take 1 capsule by mouth daily.         . clorazepate (TRANXENE) 3.75 MG tablet   Oral   Take 3.75 mg by mouth daily as needed. For anxiety         . darbepoetin (ARANESP) 60 MCG/0.3ML SOLN   Intravenous   Inject 0.3 mLs (60 mcg total) into the vein every Thursday with hemodialysis.   4.2 mL      . gabapentin (NEURONTIN) 100 MG capsule   Oral   Take 200 mg by mouth 2 (two) times daily.          . hydrOXYzine (ATARAX/VISTARIL) 50 MG tablet   Oral   Take 25 mg by mouth every 8 (eight) hours as needed.          Marland Kitchen Ketotifen Fumarate (ALAWAY OP)   Both Eyes   Place 1 drop into both eyes 2 (two) times daily as  needed. For allergies         . metoprolol tartrate (LOPRESSOR) 25 MG tablet      1/2 tablet (12.5 mg) by mouth twice a day   30 tablet   6   . mometasone (ELOCON) 0.1 % cream               . NITROSTAT 0.4 MG SL tablet   Sublingual   Place 0.4 mg under the tongue every 5 (five) minutes as needed.          Marland Kitchen olopatadine (PATANOL) 0.1 % ophthalmic solution   Both Eyes   Place 1 drop into both eyes 2 (two) times daily as needed (for allergies).   5 mL      . Pitavastatin Calcium (LIVALO) 2 MG TABS      Take 1/2 tablet daily. If tolerated then increase to 1 tablet daily.   21 tablet   0   . polyethylene glycol (MIRALAX / GLYCOLAX) packet   Oral   Take 17 g by mouth daily as needed. For constipation         . sevelamer (RENAGEL) 800 MG tablet   Oral   Take 800 mg by mouth 2 (two) times daily.          There were no vitals taken for this visit. Physical Exam  Nursing note and vitals reviewed. Constitutional: She appears well-developed and well-nourished.  Laying in bed. Appears chronically debilitated.  HENT:  Head: Normocephalic and atraumatic.  Eyes: Conjunctivae are normal. Right eye exhibits no discharge. Left eye exhibits no discharge.  Neck: Neck supple.  Cardiovascular: Normal rate, regular rhythm and normal heart sounds.  Exam reveals no gallop and no friction rub.   No murmur heard. Pulmonary/Chest: Effort normal and breath sounds normal. No respiratory distress.  Lungs clear. No increased work of breathing.  Abdominal: Soft. She exhibits no distension. There is no tenderness.  Musculoskeletal: She exhibits no edema and no tenderness.  Neurological: She is alert.  Skin: Skin is warm and dry.  Psychiatric: She has a normal mood and affect. Her behavior is normal. Thought content normal.    ED Course  Procedures (including critical care time)  CRITICAL CARE Performed by: Virgel Manifold Total critical care time: 35 minutes  Critical care time  was exclusive of separately billable procedures and treating other patients. Critical care was necessary  to treat or prevent imminent or life-threatening deterioration. Critical care was time spent personally by me on the following activities: development of treatment plan with patient and/or surrogate as well as nursing, discussions with consultants, evaluation of patient's response to treatment, examination of patient, obtaining history from patient or surrogate, ordering and performing treatments and interventions, ordering and review of laboratory studies, ordering and review of radiographic studies, pulse oximetry and re-evaluation of patient's condition.  Labs Review Labs Reviewed  URINALYSIS, ROUTINE W REFLEX MICROSCOPIC - Abnormal; Notable for the following:    Hgb urine dipstick SMALL (*)    Protein, ur 30 (*)    All other components within normal limits  CBC WITH DIFFERENTIAL - Abnormal; Notable for the following:    RBC 3.55 (*)    Hemoglobin 11.0 (*)    HCT 33.5 (*)    Platelets 100 (*)    Neutrophils Relative % 83 (*)    Lymphocytes Relative 10 (*)    All other components within normal limits  BASIC METABOLIC PANEL - Abnormal; Notable for the following:    Glucose, Bld 114 (*)    BUN 49 (*)    Creatinine, Ser 6.53 (*)    GFR calc non Af Amer 5 (*)    GFR calc Af Amer 6 (*)    All other components within normal limits  INFLUENZA PANEL BY PCR (TYPE A & B, H1N1) - Abnormal; Notable for the following:    Influenza A By PCR POSITIVE (*)    All other components within normal limits  BASIC METABOLIC PANEL - Abnormal; Notable for the following:    Chloride 113 (*)    CO2 18 (*)    Glucose, Bld 104 (*)    BUN 53 (*)    Creatinine, Ser 6.47 (*)    Calcium 8.0 (*)    GFR calc non Af Amer 6 (*)    GFR calc Af Amer 6 (*)    All other components within normal limits  CBC - Abnormal; Notable for the following:    RBC 3.10 (*)    Hemoglobin 9.8 (*)    HCT 29.6 (*)    Platelets  81 (*)    All other components within normal limits  TSH - Abnormal; Notable for the following:    TSH 0.265 (*)    All other components within normal limits  RENAL FUNCTION PANEL - Abnormal; Notable for the following:    Potassium 5.5 (*)    Chloride 116 (*)    CO2 16 (*)    Glucose, Bld 118 (*)    BUN 58 (*)    Creatinine, Ser 6.77 (*)    Albumin 3.0 (*)    GFR calc non Af Amer 5 (*)    GFR calc Af Amer 6 (*)    All other components within normal limits  CBC - Abnormal; Notable for the following:    RBC 3.30 (*)    Hemoglobin 10.4 (*)    HCT 31.3 (*)    Platelets 86 (*)    All other components within normal limits  BASIC METABOLIC PANEL - Abnormal; Notable for the following:    Glucose, Bld 101 (*)    BUN 25 (*)    Creatinine, Ser 4.44 (*)    GFR calc non Af Amer 9 (*)    GFR calc Af Amer 10 (*)    All other components within normal limits  T4 - Abnormal; Notable for the following:    T4,  Total 16.7 (*)    All other components within normal limits  CBC - Abnormal; Notable for the following:    RBC 3.77 (*)    Hemoglobin 11.8 (*)    HCT 33.7 (*)    Platelets 104 (*)    All other components within normal limits  RENAL FUNCTION PANEL - Abnormal; Notable for the following:    Potassium 3.5 (*)    Glucose, Bld 142 (*)    BUN 43 (*)    Creatinine, Ser 5.52 (*)    Phosphorus 4.8 (*)    GFR calc non Af Amer 7 (*)    GFR calc Af Amer 8 (*)    All other components within normal limits  CULTURE, BLOOD (ROUTINE X 2)  CULTURE, BLOOD (ROUTINE X 2)  URINE CULTURE  CLOSTRIDIUM DIFFICILE BY PCR  LACTIC ACID, PLASMA  URINE MICROSCOPIC-ADD ON  NOROVIRUS GROUP 1 & 2 BY PCR, STOOL  HEMOGLOBIN A1C  TSH  T3, FREE   Imaging Review No results found.  X-ray Chest Pa And Lateral   12/13/2013   CLINICAL DATA:  Dyspnea and weakness  EXAM: CHEST  2 VIEW  COMPARISON:  DG CHEST 2 VIEW dated 12/12/2013  FINDINGS: The right lung is well-expanded and clear. On the left there is stable  elevation of the hemidiaphragm. There is subsegmental atelectasis above the left hemidiaphragm that is slightly more conspicuous today. Lung markings in the left upper lobe are more conspicuous today as well. The cardiac silhouette is normal in size. There are post CABG changes present. The pulmonary vascularity is not engorged. Marland Kitchen  IMPRESSION: The findings are consistent with subsegmental atelectasis in the left upper lobe and at the left lung base posteriorly. No discrete alveolar infiltrate nor pleural effusion is demonstrated. The appearance of the chest as deteriorated slightly since yesterday's study.   Electronically Signed   By: David  Martinique   On: 12/13/2013 08:00   Dg Chest 2 View  12/12/2013   CLINICAL DATA:  Fever and cough  EXAM: CHEST  2 VIEW  COMPARISON:  09/15/2012  FINDINGS: Left hemidiaphragm remains elevated with basilar atelectasis. Lungs are otherwise clear. No pleural effusion and no pneumothorax. Postoperative changes from CABG. Normal heart size.  IMPRESSION: No active cardiopulmonary disease. Chronic and postoperative changes are noted.   Electronically Signed   By: Maryclare Bean M.D.   On: 12/12/2013 11:40   EKG Interpretation    Date/Time:  Sunday December 12 2013 09:33:31 EST Ventricular Rate:  82 PR Interval:  143 QRS Duration: 66 QT Interval:  360 QTC Calculation: 420 R Axis:   58 Text Interpretation:  Normal sinus rhythm Non-specific ST-t changes No significant change since last tracing Confirmed by Madelline Eshbach  MD, Levie Wages ([id number]) on 12/12/2013 3:13:08 PM            MDM   Final diagnoses:  Sepsis  Influenza  Hypotension  ESRD (end stage renal disease) on dialysis    79 year old female with generalized weakness and URI symptoms. Concern for sepsis initially with fever and hypotension. Empiric abx ordered including tamiflu. Flu subsequently came back positive. She has little in terms of typical flu symptoms though. Diarrhea today. May be viral illness. Abdomen  benign. Persistent hypotension after several liter of NS. Has tolerated fluids very well at this point considering her renal disease. Clinically volume depleted on arrival. Lungs clear. Continues to deny dyspnea.  Clinically not toxic. Lactic acid normal. Mentation fine. Has continued to make urine despite ESRD and current  illness. UA w/o signs of infection. Pt seems to have low normal BP typically. Discussed with CCM who evaluated pt in ED. They feel that does not need critical service at this time. Will discuss with medicine.   Vitals - 1 value per visit 08/11/2013 01/31/2013 09/15/2012 [id number]  SYSTOLIC [id number]  DIASTOLIC [address] [place]     Virgel Manifold, MD 12/16/13 [place], MD 12/16/13 1407

## 2013-12-13 ENCOUNTER — Inpatient Hospital Stay (HOSPITAL_COMMUNITY): Payer: Medicare Other

## 2013-12-13 DIAGNOSIS — N186 End stage renal disease: Secondary | ICD-10-CM | POA: Diagnosis present

## 2013-12-13 DIAGNOSIS — Z992 Dependence on renal dialysis: Secondary | ICD-10-CM

## 2013-12-13 DIAGNOSIS — R5381 Other malaise: Secondary | ICD-10-CM | POA: Diagnosis present

## 2013-12-13 LAB — CBC
HCT: 29.6 % — ABNORMAL LOW (ref 36.0–46.0)
HEMOGLOBIN: 9.8 g/dL — AB (ref 12.0–15.0)
MCH: 31.6 pg (ref 26.0–34.0)
MCHC: 33.1 g/dL (ref 30.0–36.0)
MCV: 95.5 fL (ref 78.0–100.0)
Platelets: 81 10*3/uL — ABNORMAL LOW (ref 150–400)
RBC: 3.1 MIL/uL — AB (ref 3.87–5.11)
RDW: 13.6 % (ref 11.5–15.5)
WBC: 6.6 10*3/uL (ref 4.0–10.5)

## 2013-12-13 LAB — URINE CULTURE
Colony Count: NO GROWTH
Culture: NO GROWTH

## 2013-12-13 LAB — BASIC METABOLIC PANEL
BUN: 53 mg/dL — ABNORMAL HIGH (ref 6–23)
CO2: 18 mEq/L — ABNORMAL LOW (ref 19–32)
Calcium: 8 mg/dL — ABNORMAL LOW (ref 8.4–10.5)
Chloride: 113 mEq/L — ABNORMAL HIGH (ref 96–112)
Creatinine, Ser: 6.47 mg/dL — ABNORMAL HIGH (ref 0.50–1.10)
GFR calc Af Amer: 6 mL/min — ABNORMAL LOW (ref >90)
GFR calc non Af Amer: 6 mL/min — ABNORMAL LOW (ref >90)
GLUCOSE: 104 mg/dL — AB (ref 70–99)
Potassium: 4.9 mEq/L (ref 3.7–5.3)
SODIUM: 145 meq/L (ref 137–147)

## 2013-12-13 LAB — HEMOGLOBIN A1C
Hgb A1c MFr Bld: 5.6 % (ref <5.7)
Mean Plasma Glucose: 114 mg/dL (ref <117)

## 2013-12-13 LAB — TSH: TSH: 0.265 u[IU]/mL — ABNORMAL LOW (ref 0.350–4.500)

## 2013-12-13 MED ORDER — RENA-VITE PO TABS
1.0000 | ORAL_TABLET | Freq: Every day | ORAL | Status: DC
Start: 2013-12-13 — End: 2013-12-16
  Administered 2013-12-13: 22:00:00 via ORAL
  Administered 2013-12-14 – 2013-12-15 (×2): 1 via ORAL
  Filled 2013-12-13 (×4): qty 1

## 2013-12-13 MED ORDER — CLORAZEPATE DIPOTASSIUM 3.75 MG PO TABS
3.7500 mg | ORAL_TABLET | Freq: Every day | ORAL | Status: DC
Start: 2013-12-13 — End: 2013-12-16
  Administered 2013-12-13 – 2013-12-15 (×4): 3.75 mg via ORAL
  Filled 2013-12-13 (×4): qty 1

## 2013-12-13 MED ORDER — OSELTAMIVIR PHOSPHATE 30 MG PO CAPS
30.0000 mg | ORAL_CAPSULE | ORAL | Status: DC
  Administered 2013-12-14: 30 mg via ORAL
  Filled 2013-12-13 (×2): qty 1

## 2013-12-13 NOTE — Consult Note (Signed)
PULMONARY / CRITICAL CARE MEDICINE  Name: Diane [name] MRN: [id number] DOB: 1934/11/30    ADMISSION DATE:  12/12/2013 CONSULTATION DATE:  12/12/2013  REFERRING MD :  EDP PRIMARY SERVICE: TRH  CHIEF COMPLAINT:  Hypotension   BRIEF PATIENT DESCRIPTION: 79 y/o presented to [facility] on 2/22 with hypotension, weakness, diarrhea. Influenza positive.   SIGNIFICANT EVENTS / STUDIES:   CULTURES: 2/22  Urine >>> neg 2/22  Influenza >>> positive for A 2/22  Blood >>>  ANTIBIOTICS: Vanco 2/22 >>> Zosyn 2/22 >>>  INTERVAL HISTORY: No overnight issues.  Hemodynamically stable.  VITAL SIGNS: Temp:  [98.4 F (36.9 C)-99 F (37.2 C)] 99 F (37.2 C) (02/23 0707) Pulse Rate:  [64-86] 77 (02/23 0707) Resp:  [12-24] 19 (02/23 0707) BP: (75-111)/(36-55) 99/49 mmHg (02/23 0707) SpO2:  [95 %-100 %] 99 % (02/23 0707) Weight:  [52.8 kg (116 lb 6.5 oz)-54 kg (119 lb 0.8 oz)] 52.8 kg (116 lb 6.5 oz) (02/23 0300)  HEMODYNAMICS:   VENTILATOR SETTINGS:   INTAKE / OUTPUT: Intake/Output     02/22 0701 - 02/23 0700 02/23 0701 - 02/24 0700   P.O.  240   I.V. (mL/kg) 2400 (45.5)    Total Intake(mL/kg) 2400 (45.5) 240 (4.5)   Urine (mL/kg/hr) 1400    Total Output 1400     Net +1000 +240        Stool Occurrence 1 x      PHYSICAL EXAMINATION: General:  No distress, resting Neuro:  Awake, alert HEENT:  No JVD, PERRL Cardiovascular:  Regular, no murmurs Lungs:  CTAB Abdomen:  Soft, bowel sounds present Musculoskeletal:  No edema. LUE AV fistula with thrill. Skin:  No rash  LABS:  CBC  Recent Labs Lab 12/12/13 1015 12/13/13 0240  WBC 8.4 6.6  HGB 11.0* 9.8*  HCT 33.5* 29.6*  PLT 100* 81*   Coag's No results found for this basename: APTT, INR,  in the last 168 hours BMET  Recent Labs Lab 12/12/13 1015 12/13/13 0240  NA 142 145  K 4.7 4.9  CL 104 113*  CO2 19 18*  BUN 49* 53*  CREATININE 6.53* 6.47*  GLUCOSE 114* 104*   Electrolytes  Recent Labs Lab 12/12/13 1015  12/13/13 0240  CALCIUM 9.0 8.0*   Sepsis Markers  Recent Labs Lab 12/12/13 1015  LATICACIDVEN 1.3   ABG No results found for this basename: PHART, PCO2ART, PO2ART,  in the last 168 hours Liver Enzymes No results found for this basename: AST, ALT, ALKPHOS, BILITOT, ALBUMIN,  in the last 168 hours Cardiac Enzymes No results found for this basename: TROPONINI, PROBNP,  in the last 168 hours Glucose No results found for this basename: GLUCAP,  in the last 168 hours  Imaging X-ray Chest Pa And Lateral   12/13/2013   CLINICAL DATA:  Dyspnea and weakness  EXAM: CHEST  2 VIEW  COMPARISON:  DG CHEST 2 VIEW dated 12/12/2013  FINDINGS: The right lung is well-expanded and clear. On the left there is stable elevation of the hemidiaphragm. There is subsegmental atelectasis above the left hemidiaphragm that is slightly more conspicuous today. Lung markings in the left upper lobe are more conspicuous today as well. The cardiac silhouette is normal in size. There are post CABG changes present. The pulmonary vascularity is not engorged. Marland Kitchen  IMPRESSION: The findings are consistent with subsegmental atelectasis in the left upper lobe and at the left lung base posteriorly. No discrete alveolar infiltrate nor pleural effusion is demonstrated. The appearance of the  chest as deteriorated slightly since yesterday's study.   Electronically Signed   By: David  Martinique   On: 12/13/2013 08:00   Dg Chest 2 View  12/12/2013   CLINICAL DATA:  Fever and cough  EXAM: CHEST  2 VIEW  COMPARISON:  09/15/2012  FINDINGS: Left hemidiaphragm remains elevated with basilar atelectasis. Lungs are otherwise clear. No pleural effusion and no pneumothorax. Postoperative changes from CABG. Normal heart size.  IMPRESSION: No active cardiopulmonary disease. Chronic and postoperative changes are noted.   Electronically Signed   By: Maryclare Bean M.D.   On: 12/12/2013 11:40   ASSESSMENT / PLAN:  Influenza A Suspected flu pneumonitis appears to  resolve Hypotension in presence of hypovolemia, resolved CAD Hypovolemia ESRD on HD Possible viral gastroenteritis Chronic anemia Hyperglycemia   -->  Appears to be stable for transfer out of SDU -->  Continue Tamiflu to complete course -->  Consider D/c abx -->  HD per Renal -->  PCCM will sign off.  Please re consult as necessary  I have personally obtained history, examined patient, evaluated and interpreted laboratory and imaging results, reviewed medical records, formulated assessment / plan and placed orders.  Doree Fudge, MD [facility] [place] Pager: [phone]  12/13/2013, 11:16 AM

## 2013-12-13 NOTE — Progress Notes (Signed)
[facility]  Diane [name] ZOX:[id number] DOB: [birth year] DOA: 12/12/2013 PCP: Donnajean Lopes, MD  Brief narrative: 79 year old female patient with chronic kidney disease on dialysis. Presented to the hospital because of fevers and chills. Prior to presentation she began having rhinorrhea and coughing that was subsequently followed by fever and chills. On the date of admission she developed diarrheal stools.   In the emergency department the patient's blood pressure was in the 70s and she was given 2.5 L of normal saline. In addition her fever was 101 though she had no leukocytosis. Since her blood pressure improved after hydration it was not felt that she was experiencing septic shock. She had also missed several dialysis treatments because of feeling poorly although one of the treatment she did make up this past Wednesday. Influenza PCR was positive for type A  Assessment/Plan:  SIRS -No definite bacterial source but does have viral source ie influnza -Continue supportive care -Blood pressure improving given the fact she is a dialysis patient would only give IV fluids it becomes hypotensive again and would bolus -Normal virus panel pending but diarrhea has resolved and do not suspect she actually has noro virus but diarrhea related to influenza  Influenza A - continue Tamiflu and supportive care  CKD (chronic kidney disease) stage V requiring chronic dialysis -Per nephrology  Hypertension -BP soft  Physical deconditioning -Patient cares for her debilitated husband at home -Currently very weak -PT evaluation  CAD (coronary artery disease) Severe three vessel/post CABG x 5, 08/15/12, LIMA-LAD;VG-diag;VG-OM; seq VG-PDA,PLA   DVT prophylaxis: Subcutaneous heparin Code Status: DO NOT RESUSCITATE Family Communication: No family at bedside Disposition Plan/Expected LOS: Transfer to  floor  Consultants: Nephrology  Procedures: None  Antibiotics: Tamiflu 2/22 >>> Vancomycin x1  Zosyn x1  HPI/Subjective: Patient alert and sitting on side of bed. Endorses significant malaise but does want to begin getting out of bed to avoid deconditioning. Currently no chest pain, cough or shortness of breath. No recurrent diarrhea.  Objective: Blood pressure 111/54, pulse 77, temperature 98.1 F (36.7 C), temperature source Oral, resp. rate 20, height 5\' 2"  (1.575 m), weight 52.8 kg (116 lb 6.5 oz), SpO2 97.00%.  Intake/Output Summary (Last 24 hours) at 12/13/13 1738 Last data filed at 12/13/13 1300  Gross per 24 hour  Intake    480 ml  Output   2450 ml  Net  -1970 ml   Exam: General: No acute respiratory distress Lungs: Clear to auscultation bilaterally without wheezes or crackles, RA Cardiovascular: Regular rate and rhythm without murmur gallop or rub normal S1 and S2, no peripheral edema or JVD Abdomen: Nontender, nondistended, soft, bowel sounds positive, no rebound, no ascites, no appreciable mass Musculoskeletal: No significant cyanosis, clubbing of bilateral lower extremities Neurological: Alert and oriented x 3, moves all extremities x 4 without focal neurological deficits, CN 2-12 intact  Scheduled Meds:  Scheduled Meds: . aspirin  81 mg Oral Daily  . calcium acetate  667 mg Oral Q breakfast  . clorazepate  3.75 mg Oral QHS  . heparin  5,000 Units Subcutaneous 3 times per day  . multivitamin  1 tablet Oral QHS  . [START ON 12/14/2013] oseltamivir  30 mg Oral Q T,Th,Sat-1800  . sodium chloride  1,000 mL Intravenous Once  . sodium chloride  3 mL Intravenous Q12H   Data Reviewed: Basic Metabolic Panel:  Recent Labs Lab 12/12/13 1015 12/13/13 0240  NA 142 145  K 4.7 4.9  CL 104 113*  CO2 19 18*  GLUCOSE 114* 104*  BUN 49* 53*  CREATININE 6.53* 6.47*  CALCIUM 9.0 8.0*   Liver Function Tests: No results found for this basename: AST, ALT, ALKPHOS,  BILITOT, PROT, ALBUMIN,  in the last 168 hours  CBC:  Recent Labs Lab 12/12/13 1015 12/13/13 0240  WBC 8.4 6.6  NEUTROABS 7.0  --   HGB 11.0* 9.8*  HCT 33.5* 29.6*  MCV 94.4 95.5  PLT 100* 81*    Recent Results (from the past 240 hour(s))  CULTURE, BLOOD (ROUTINE X 2)     Status: None   Collection Time    12/12/13 10:05 AM      Result Value Ref Range Status   Specimen Description BLOOD RIGHT ANTECUBITAL   Final   Special Requests BOTTLES DRAWN AEROBIC AND ANAEROBIC 5CC   Final   Culture  Setup Time     Final   Value: 12/12/2013 14:31     Performed at [facility]   Culture     Final   Value:        BLOOD CULTURE RECEIVED NO GROWTH TO DATE CULTURE WILL BE HELD FOR 5 DAYS BEFORE ISSUING A FINAL NEGATIVE REPORT     Performed at [facility]   Report Status PENDING   Incomplete  CULTURE, BLOOD (ROUTINE X 2)     Status: None   Collection Time    12/12/13 10:15 AM      Result Value Ref Range Status   Specimen Description BLOOD RIGHT WRIST   Final   Special Requests BOTTLES DRAWN AEROBIC AND ANAEROBIC 5CC   Final   Culture  Setup Time     Final   Value: 12/12/2013 14:31     Performed at [facility]   Culture     Final   Value:        BLOOD CULTURE RECEIVED NO GROWTH TO DATE CULTURE WILL BE HELD FOR 5 DAYS BEFORE ISSUING A FINAL NEGATIVE REPORT     Performed at [facility]   Report Status PENDING   Incomplete  CLOSTRIDIUM DIFFICILE BY PCR     Status: None   Collection Time    12/12/13  3:38 PM      Result Value Ref Range Status   C difficile by pcr NEGATIVE  NEGATIVE Final     Studies:  Recent x-ray studies have been reviewed in detail by the Attending Physician  Time spent :  35mins     Allison Ellis, ANP [facility] Office  [phone] Pager [phone]  **If unable to reach the above provider after paging please contact the [place] @ [phone]  On-Call/Text Page:      [email or web address]      password [email or web address]  If  7PM-7AM, please contact night-coverage [email or web address] Password [facility] 12/13/2013, 5:38 PM   LOS: 1 day   I have personally examined this patient and reviewed the entire database. I have reviewed the above note, made any necessary editorial changes, and agree with its content.  Cherene Altes, MD [facility]

## 2013-12-13 NOTE — Progress Notes (Signed)
Utilization review completed.  

## 2013-12-13 NOTE — Consult Note (Signed)
Indication for Consultation:  Management of ESRD/hemodialysis; anemia, hypertension/volume and secondary hyperparathyroidism  HPI: Diane [name] is a 79 y.o. female who was admitted for fever, chills and diarrhea. She tested + for influenza A. She receives HD TTS @ [facility], history of HTN, CAD, CABG. On Thursday she began to feel very weak, she also noted cough and rhinorrhea; she then began to experience chills and diarrhea. On Saturday night she was so weak she could not get off the commode at home so her daughter called EMS to bring in her for further eval. She reports feeling much better now.  Past Medical History  Diagnosis Date  . Anemia in chronic kidney disease(285.21)   . Bipolar disorder, unspecified   . Iron deficiency anemia, unspecified   . Secondary hyperparathyroidism (of renal origin)   . Restless legs syndrome (RLS)   . Unspecified hypertensive kidney disease with chronic kidney disease stage V or end stage renal disease   . 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 Personal history of colonic polyps   . Diverticulosis of colon (without mention of hemorrhage) 2006    Colonoscopy Dr. Oletta Lamas  . Coronary artery disease   . Anginal pain   . S/P CABG x 5, 08/15/12, LIMA-LAD;VG-diag;VG-OM; seq VG-PDA,PLA  08/18/2012   Past Surgical History  Procedure Laterality Date  . Arteriovenous graft placement  11/2010    left upper - AVGG Dr. Donnetta Hutching  . Arteriovenous graft placement  12/2009    left lower - [facility] Dr. Donnetta Hutching  . Av fistula placement  07/2009    right upper AVF Dr. Donnetta Hutching  . Av fistula placement  01/2002    right lower Dr. Donnetta Hutching  . Cardiac catheterization  08/12/2012  . Abdominal hysterectomy    . Coronary artery bypass graft  08/14/2012    Procedure: CORONARY ARTERY BYPASS GRAFTING (CABG);  Surgeon: Gaye Pollack, MD;  Location: [place];  Service: Open Heart Surgery;  Laterality: N/A;  times five, using left  internal mammary  . Endovein harvest of greater saphenous vein  08/14/2012    Procedure: ENDOVEIN HARVEST OF GREATER SAPHENOUS VEIN;  Surgeon: Gaye Pollack, MD;  Location: [place];  Service: Open Heart Surgery;  Laterality: Bilateral;   History reviewed. No pertinent family history. Social History:  reports that she has never smoked. She has never used smokeless tobacco. She reports that she does not drink alcohol or use illicit drugs. Allergies  Allergen Reactions  . Sulfa Antibiotics Other (See Comments)    Migraine   . Latex Rash  . Tape Rash    Can only use paper tape    Prior to Admission medications   Medication Sig Start Date End Date Taking? Authorizing Provider  acetaminophen (TYLENOL) 500 MG tablet Take 1,000 mg by mouth every 6 (six) hours as needed. For pain   Yes Historical Provider, MD  aspirin 81 MG tablet Take 81 mg by mouth daily.   Yes Historical Provider, MD  b complex-vitamin c-folic acid (NEPHRO-VITE) 0.8 MG TABS Take 0.8 mg by mouth daily.   Yes Historical Provider, MD  calcium acetate (PHOSLO) 667 MG capsule Take 1 capsule by mouth daily. 07/05/13  Yes Historical Provider, MD  clorazepate (TRANXENE) 3.75 MG tablet Take 3.75 mg by mouth at bedtime. For anxiety   Yes Historical Provider, MD  gabapentin (NEURONTIN) 100 MG capsule Take 200 mg by mouth 2 (two) times daily.    Yes  Historical Provider, MD  hydrOXYzine (ATARAX/VISTARIL) 50 MG tablet Take 50 mg by mouth at bedtime.  09/04/12  Yes Historical Provider, MD  Ketotifen Fumarate (ALAWAY OP) Place 1 drop into both eyes 2 (two) times daily as needed. For allergies   Yes Historical Provider, MD  metoprolol tartrate (LOPRESSOR) 25 MG tablet Take 12.5 mg by mouth 2 (two) times daily.   Yes Historical Provider, MD  mometasone (ELOCON) 0.1 % cream Apply 1 application topically as needed (for itching from tape).  08/10/13  Yes Historical Provider, MD  olopatadine (PATANOL) 0.1 % ophthalmic solution Place 1 drop into both  eyes 2 (two) times daily as needed (for allergies). 08/19/12  Yes Erin Barrett, PA-C  polyethylene glycol (MIRALAX / GLYCOLAX) packet Take 17 g by mouth daily as needed. For constipation   Yes Historical Provider, MD  sevelamer (RENAGEL) 800 MG tablet Take 800 mg by mouth 2 (two) times daily.   Yes Historical Provider, MD  NITROSTAT 0.4 MG SL tablet Place 0.4 mg under the tongue every 5 (five) minutes as needed.  07/07/12   Historical Provider, MD   Current Facility-Administered Medications  Medication Dose Route Frequency Provider Last Rate Last Dose  . aspirin chewable tablet 81 mg  81 mg Oral Daily Verlee Monte, MD   81 mg at 12/12/13 1938  . calcium acetate (PHOSLO) capsule 667 mg  667 mg Oral Q breakfast Verlee Monte, MD      . clorazepate (TRANXENE) tablet 3.75 mg  3.75 mg Oral QHS Randall M Reidler, PA-C   3.75 mg at 12/13/13 0203  . heparin injection 5,000 Units  5,000 Units Subcutaneous 3 times per day Verlee Monte, MD   5,000 Units at 12/13/13 2703  . hydrOXYzine (ATARAX/VISTARIL) tablet 50 mg  50 mg Oral QHS PRN Jerrye Bushy Reidler, PA-C   50 mg at 12/12/13 2140  . multivitamin (RENA-VIT) tablet 1 tablet  1 tablet Oral QHS Marlena Clipper, NP      . olopatadine (PATANOL) 0.1 % ophthalmic solution 1 drop  1 drop Both Eyes BID PRN Verlee Monte, MD      . oseltamivir (TAMIFLU) capsule 30 mg  30 mg Oral BID Verlee Monte, MD   30 mg at 12/12/13 2140  . sodium chloride 0.9 % bolus 1,000 mL  1,000 mL Intravenous Once Verlee Monte, MD      . sodium chloride 0.9 % injection 3 mL  3 mL Intravenous Q12H Verlee Monte, MD   3 mL at 12/12/13 2140   Labs: Basic Metabolic Panel:  Recent Labs Lab 12/12/13 1015 12/13/13 0240  NA 142 145  K 4.7 4.9  CL 104 113*  CO2 19 18*  GLUCOSE 114* 104*  BUN 49* 53*  CREATININE 6.53* 6.47*  CALCIUM 9.0 8.0*   Liver Function Tests: No results found for this basename: AST, ALT, ALKPHOS, BILITOT, PROT, ALBUMIN,  in the last 168 hours No results found  for this basename: LIPASE, AMYLASE,  in the last 168 hours No results found for this basename: AMMONIA,  in the last 168 hours CBC:  Recent Labs Lab 12/12/13 1015 12/13/13 0240  WBC 8.4 6.6  NEUTROABS 7.0  --   HGB 11.0* 9.8*  HCT 33.5* 29.6*  MCV 94.4 95.5  PLT 100* 81*   Cardiac Enzymes: No results found for this basename: CKTOTAL, CKMB, CKMBINDEX, TROPONINI,  in the last 168 hours CBG: No results found for this basename: GLUCAP,  in the last 168 hours Iron Studies: No results  found for this basename: IRON, TIBC, TRANSFERRIN, FERRITIN,  in the last 72 hours Studies/Results: X-ray Chest Pa And Lateral   12/13/2013   CLINICAL DATA:  Dyspnea and weakness  EXAM: CHEST  2 VIEW  COMPARISON:  DG CHEST 2 VIEW dated 12/12/2013  FINDINGS: The right lung is well-expanded and clear. On the left there is stable elevation of the hemidiaphragm. There is subsegmental atelectasis above the left hemidiaphragm that is slightly more conspicuous today. Lung markings in the left upper lobe are more conspicuous today as well. The cardiac silhouette is normal in size. There are post CABG changes present. The pulmonary vascularity is not engorged. Marland Kitchen  IMPRESSION: The findings are consistent with subsegmental atelectasis in the left upper lobe and at the left lung base posteriorly. No discrete alveolar infiltrate nor pleural effusion is demonstrated. The appearance of the chest as deteriorated slightly since yesterday's study.   Electronically Signed   By: David  Martinique   On: 12/13/2013 08:00   Dg Chest 2 View  12/12/2013   CLINICAL DATA:  Fever and cough  EXAM: CHEST  2 VIEW  COMPARISON:  09/15/2012  FINDINGS: Left hemidiaphragm remains elevated with basilar atelectasis. Lungs are otherwise clear. No pleural effusion and no pneumothorax. Postoperative changes from CABG. Normal heart size.  IMPRESSION: No active cardiopulmonary disease. Chronic and postoperative changes are noted.   Electronically Signed   By: Maryclare Bean M.D.   On: 12/12/2013 11:40     Review of Systems: Gen: Reports fever, chills, sweats, fatigue, weakness, malaise. Reports feeling much better now, good appetite HEENT: No visual complaints, No history of Retinopathy. Normal external appearance No Epistaxis or Sore throat. No sinusitis.  Reports rhinorhea CV: Denies chest pain, angina, palpitations, syncope, orthopnea, PND, peripheral edema, and claudication. Resp:  Reports dry cough, Denies dyspnea at rest, dyspnea with exercise, sputum, wheezing, coughing up blood, and pleurisy. GI: Denies vomiting blood, jaundice, and fecal incontinence.   Denies dysphagia or odynophagia. GU : Denies urinary burning, blood in urine, urinary frequency, urinary hesitancy, nocturnal urination, and urinary incontinence.   MS: Reports back ache. Denies joint pain, limitation of movement, and swelling, stiffness, low back pain, extremity pain. Denies muscle weakness, cramps, atrophy.  No use of non steroidal antiinflammatory drugs. Derm: Denies rash, itching, dry skin, hives, moles, warts, or unhealing ulcers.  Psych: Reports stress with caring for husband. Denies depression, anxiety, memory loss, suicidal ideation, hallucinations, paranoia, and confusion.  Heme: Denies bruising, bleeding, and enlarged lymph nodes. Neuro: No headache.  No diplopia. No dysarthria.  No dysphasia.  No history of CVA.  No Seizures. No paresthesias.  No weakness. Endocrine No DM.  No Thyroid disease.  No Adrenal disease.  Physical Exam: Filed Vitals:   12/12/13 1936 12/12/13 2351 12/13/13 0300 12/13/13 0707  BP: 111/55 96/44 96/46  99/49  Pulse: 86 80 77 77  Temp: 98.4 F (36.9 C) 98.7 F (37.1 C) 99 F (37.2 C) 99 F (37.2 C)  TempSrc: Oral Oral Oral Oral  Resp: 15 12 19 19   Height:      Weight:   52.8 kg (116 lb 6.5 oz)   SpO2: 95% 97% 96% 99%     General: Well developed, well nourished, in no acute distress. Head: Normocephalic, atraumatic, sclera non-icteric, mucus  membranes are moist Neck: Supple. JVD not elevated. Lungs: Clear bilaterally to auscultation without wheezes, rales, or rhonchi. Breathing is unlabored. occassional dry cough Heart: RRR with S1 S2. No murmurs, rubs, or gallops appreciated. Abdomen: Soft,  non-tender, non-distended with normoactive bowel sounds. No rebound/guarding. No obvious abdominal masses. M-S:  Strength and tone appear normal for age. Lower extremities:without edema or ischemic changes, no open wounds  Neuro: Alert and oriented X 3. Moves all extremities spontaneously. Psych:  Responds to questions appropriately with a normal affect. Dialysis Access:LUA AVG +bruit/thrill  Dialysis Orders: TTS @ [facility] 53.5kg  3:30  2K/2.25ca  Heparin 5000.  L AVG  400/1.5 hectorol 1 mcg IV/HD   Assessment/Plan: 1.  Sepsis- febrile, Tmax 101, afebrile now. Vanc given in ED. Blood and urine cultures pending. Cdiff negative. 2. Influenza A- tested +, started on Tamiflu, symptomatic management 3.  ESRD -  TTS @ [facility], last HD Thursday and left under EDW- lower at DC. K+ 4.7 4.  Hypotension/volume  - 99/49. Holding home metop, was given 2L IVF in ED. EDW 53.5, now 52.8Kg, no volume overload on exam. Chest xray- no effusion 5.  Anemia  - hgb 9.8 No outpt epo- start here. Last Tsat 32%, Ferr [id number]. 6.  Metabolic bone disease -  Ca+ 8. Last PTH 394/ P 5.6. Cont Phoslo with meals, hectorol.Q HD 7.  Nutrition - Alb4.6 renal diet, good appetite, multivitamin  Shelle Iron, NP [facility] Murlean Hark [phone] 12/13/2013, 8:29 AM   I have seen and examined patient, discussed with PA and agree with assessment and plan as outlined above. Kelly Splinter MD pager [phone]    cell [phone] 12/13/2013, 6:34 PM

## 2013-12-14 LAB — RENAL FUNCTION PANEL
ALBUMIN: 3 g/dL — AB (ref 3.5–5.2)
BUN: 58 mg/dL — AB (ref 6–23)
CO2: 16 mEq/L — ABNORMAL LOW (ref 19–32)
Calcium: 8.5 mg/dL (ref 8.4–10.5)
Chloride: 116 mEq/L — ABNORMAL HIGH (ref 96–112)
Creatinine, Ser: 6.77 mg/dL — ABNORMAL HIGH (ref 0.50–1.10)
GFR calc non Af Amer: 5 mL/min — ABNORMAL LOW (ref >90)
GFR, EST AFRICAN AMERICAN: 6 mL/min — AB (ref >90)
Glucose, Bld: 118 mg/dL — ABNORMAL HIGH (ref 70–99)
PHOSPHORUS: 4.6 mg/dL (ref 2.3–4.6)
Potassium: 5.5 mEq/L — ABNORMAL HIGH (ref 3.7–5.3)
Sodium: 147 mEq/L (ref 137–147)

## 2013-12-14 LAB — CBC
HCT: 31.3 % — ABNORMAL LOW (ref 36.0–46.0)
Hemoglobin: 10.4 g/dL — ABNORMAL LOW (ref 12.0–15.0)
MCH: 31.5 pg (ref 26.0–34.0)
MCHC: 33.2 g/dL (ref 30.0–36.0)
MCV: 94.8 fL (ref 78.0–100.0)
PLATELETS: 86 10*3/uL — AB (ref 150–400)
RBC: 3.3 MIL/uL — ABNORMAL LOW (ref 3.87–5.11)
RDW: 13.8 % (ref 11.5–15.5)
WBC: 7.3 10*3/uL (ref 4.0–10.5)

## 2013-12-14 MED ORDER — DARBEPOETIN ALFA-POLYSORBATE 25 MCG/0.42ML IJ SOLN: 25.0000 ug | INTRAMUSCULAR | Status: DC

## 2013-12-14 MED ORDER — SODIUM CHLORIDE 0.9 % IV SOLN: 100.0000 mL | INTRAVENOUS | Status: DC | PRN

## 2013-12-14 MED ORDER — ALTEPLASE 2 MG IJ SOLR
2.0000 mg | Freq: Once | INTRAMUSCULAR | Status: DC | PRN
  Filled 2013-12-14: qty 2

## 2013-12-14 MED ORDER — HEPARIN SODIUM (PORCINE) 1000 UNIT/ML DIALYSIS
1000.0000 [IU] | INTRAMUSCULAR | Status: DC | PRN
  Filled 2013-12-14: qty 1

## 2013-12-14 MED ORDER — NEPRO/CARBSTEADY PO LIQD: 237.0000 mL | ORAL | Status: DC | PRN

## 2013-12-14 MED ORDER — HEPARIN SODIUM (PORCINE) 1000 UNIT/ML DIALYSIS
5000.0000 [IU] | Freq: Once | INTRAMUSCULAR | Status: DC
  Filled 2013-12-14: qty 5

## 2013-12-14 MED ORDER — LIDOCAINE-PRILOCAINE 2.5-2.5 % EX CREA: 1.0000 "application " | TOPICAL_CREAM | CUTANEOUS | Status: DC | PRN

## 2013-12-14 MED ORDER — LIDOCAINE HCL (PF) 1 % IJ SOLN: 5.0000 mL | INTRAMUSCULAR | Status: DC | PRN

## 2013-12-14 MED ORDER — PENTAFLUOROPROP-TETRAFLUOROETH EX AERO: 1.0000 "application " | INHALATION_SPRAY | CUTANEOUS | Status: DC | PRN

## 2013-12-14 MED ORDER — DOXERCALCIFEROL 4 MCG/2ML IV SOLN
1.0000 ug | INTRAVENOUS | Status: DC
  Administered 2013-12-14: 1 ug via INTRAVENOUS
  Filled 2013-12-14: qty 2

## 2013-12-14 MED ORDER — DARBEPOETIN ALFA-POLYSORBATE 25 MCG/0.42ML IJ SOLN
25.0000 ug | INTRAMUSCULAR | Status: DC
  Administered 2013-12-14: 25 ug via INTRAVENOUS
  Filled 2013-12-14: qty 0.42

## 2013-12-14 MED ORDER — HYDROCODONE-ACETAMINOPHEN 5-325 MG PO TABS
1.0000 | ORAL_TABLET | ORAL | Status: DC | PRN
  Administered 2013-12-14 – 2013-12-16 (×3): 1 via ORAL
  Filled 2013-12-14 (×3): qty 1

## 2013-12-14 MED ORDER — DARBEPOETIN ALFA-POLYSORBATE 25 MCG/0.42ML IJ SOLN
INTRAMUSCULAR | Status: AC
  Administered 2013-12-14: 25 ug via INTRAVENOUS
  Filled 2013-12-14: qty 0.42

## 2013-12-14 MED ORDER — DOXERCALCIFEROL 4 MCG/2ML IV SOLN
INTRAVENOUS | Status: AC
  Administered 2013-12-14: 1 ug via INTRAVENOUS
  Filled 2013-12-14: qty 2

## 2013-12-14 MED ORDER — LOPERAMIDE HCL 2 MG PO CAPS
2.0000 mg | ORAL_CAPSULE | Freq: Four times a day (QID) | ORAL | Status: DC | PRN
  Administered 2013-12-14: 2 mg via ORAL
  Filled 2013-12-14: qty 1

## 2013-12-14 NOTE — Progress Notes (Signed)
  [facility] Progress Note   Subjective: No diarrhea overnight, feels "cold", still "very weak"  Filed Vitals:   12/14/13 0745 12/14/13 0803 12/14/13 0808 12/14/13 0830  BP: 115/75 112/56 117/56 106/54  Pulse: 71 70 72 75  Temp: 98.3 F (36.8 C)     TempSrc: Oral     Resp: 18     Height:      Weight: 51.1 kg (112 lb 10.5 oz)     SpO2:      Exam Alert elderly female, no distress No jvd Chest is clear bilat RRR no MRG Abd soft, nt, nd No LE edema LUA AVG patent Neuro is nf, ox3  Dialysis: TTS [facility] 3.5h   53.5kg   2K/2.25 Bath   Heparin 5000   L AVG  Hectorol 1     Epo none     Venofer none   Assessment: 1 Fever / influenza A / diarrhea 2 Hypotension- from vol depletion, better after IVF's 3 ESRD on HD 4 Anemia- Hb 9.8, low dose darbe 25/wk 5 2HPT- cont D, binders 6 Hyperkalemia- hd today   Plan- HD today, no fluid off    Kelly Splinter MD  pager [phone]    cell [phone]  12/14/2013, 8:53 AM     Recent Labs Lab 12/12/13 1015 12/13/13 0240 12/14/13 0540  NA 142 145 147  K 4.7 4.9 5.5*  CL 104 113* 116*  CO2 19 18* 16*  GLUCOSE 114* 104* 118*  BUN 49* 53* 58*  CREATININE 6.53* 6.47* 6.77*  CALCIUM 9.0 8.0* 8.5  PHOS  --   --  4.6    Recent Labs Lab 12/14/13 0540  ALBUMIN 3.0*    Recent Labs Lab 12/12/13 1015 12/13/13 0240 12/14/13 0818  WBC 8.4 6.6 7.3  NEUTROABS 7.0  --   --   HGB 11.0* 9.8* 10.4*  HCT 33.5* 29.6* 31.3*  MCV 94.4 95.5 94.8  PLT 100* 81* 86*   . aspirin  81 mg Oral Daily  . calcium acetate  667 mg Oral Q breakfast  . clorazepate  3.75 mg Oral QHS  . darbepoetin  25 mcg Intravenous Q7 days  . doxercalciferol  1 mcg Intravenous Q T,Th,Sa-HD  . heparin  5,000 Units Subcutaneous 3 times per day  . heparin  5,000 Units Dialysis Once in dialysis  . multivitamin  1 tablet Oral QHS  . oseltamivir  30 mg Oral Q T,Th,Sat-1800  . sodium chloride  3 mL Intravenous Q12H     sodium chloride, sodium  chloride, alteplase, feeding supplement (NEPRO CARB STEADY), heparin, hydrOXYzine, lidocaine (PF), lidocaine-prilocaine, olopatadine, pentafluoroprop-tetrafluoroeth

## 2013-12-14 NOTE — Progress Notes (Signed)
[facility] PROGRESS NOTE  Diane [name] [id number] DOB: 1935/03/29 DOA: 12/12/2013 PCP: Donnajean Lopes, MD  Assessment/Plan:  SIRS  -No definite bacterial source but does have viral source ie influnza  -Continue supportive care  - Tamiflu was started   Diarrhea - Stool for Cdiff PCR is negative - Will start Imodium prn  Influenza A  - continue Tamiflu and supportive care   CKD (chronic kidney disease) stage V requiring chronic dialysis  -Per nephrology   Hypotension - BP has improved.  Leg/feet pain Will start her on vicodin prn  Physical deconditioning  -Patient cares for her debilitated husband at home  -Currently very weak  -PT evaluation   CAD (coronary artery disease)  Severe three vessel/post CABG x 5, 08/15/12, LIMA-LAD;VG-diag;VG-OM; seq VG-PDA,PLA    Code Status: *DNR Family Communication: *No family at bedside Disposition Plan: *Home when stable   Consultants:  Nephrology   Procedures:  None  Antibiotics:  *Tamiflu  HPI/Subjective: 79 year old female patient with chronic kidney disease on dialysis. Presented to the hospital because of fevers and chills. Prior to presentation she began having rhinorrhea and coughing that was subsequently followed by fever and chills. On the date of admission she developed diarrheal stools.  In the emergency department the patient's blood pressure was in the 70s and she was given 2.5 L of normal saline. In addition her fever was 101 though she had no leukocytosis. Since her blood pressure improved after hydration it was not felt that she was experiencing septic shock. She had also missed several dialysis treatments because of feeling poorly although one of the treatment she did make up this past Wednesday. Influenza PCR was positive for type A  Patient seen and examined, admitted with Influenza, diarrhea. Started on Tamiflu, stool for c diff PCR is negative  Objective: Filed Vitals:   12/14/13  1259  BP: 101/47  Pulse: 92  Temp: 99.5 F (37.5 C)  Resp: 15    Intake/Output Summary (Last 24 hours) at 12/14/13 1455 Last data filed at 12/14/13 1349  Gross per 24 hour  Intake    480 ml  Output   -356 ml  Net    836 ml   Filed Weights   12/13/13 2150 12/14/13 0745 12/14/13 1122  Weight: 52.254 kg (115 lb 3.2 oz) 51.1 kg (112 lb 10.5 oz) 51.2 kg (112 lb 14 oz)    Exam:  Physical Exam: Head: Normocephalic, atraumatic.  Eyes: No signs of jaundice, EOMI Nose: Mucous membranes dry.  Throat: Oropharynx nonerythematous, no exudate appreciated.  Neck: supple,No deformities, masses, or tenderness noted. Lungs: Normal respiratory effort. B/L Clear to auscultation, no crackles or wheezes.  Heart: Regular RR. S1 and S2 normal  Abdomen: BS normoactive. Soft, Nondistended, non-tender.  Extremities: No pretibial edema, no erythema     Data Reviewed: Basic Metabolic Panel:  Recent Labs Lab 12/12/13 1015 12/13/13 0240 12/14/13 0540  NA 142 145 147  K 4.7 4.9 5.5*  CL 104 113* 116*  CO2 19 18* 16*  GLUCOSE 114* 104* 118*  BUN 49* 53* 58*  CREATININE 6.53* 6.47* 6.77*  CALCIUM 9.0 8.0* 8.5  PHOS  --   --  4.6   Liver Function Tests:  Recent Labs Lab 12/14/13 0540  ALBUMIN 3.0*   No results found for this basename: LIPASE, AMYLASE,  in the last 168 hours No results found for this basename: AMMONIA,  in the last 168 hours CBC:  Recent Labs Lab 12/12/13 1015 12/13/13 0240 12/14/13 0818  WBC 8.4 6.6 7.3  NEUTROABS 7.0  --   --   HGB 11.0* 9.8* 10.4*  HCT 33.5* 29.6* 31.3*  MCV 94.4 95.5 94.8  PLT 100* 81* 86*   Cardiac Enzymes: No results found for this basename: CKTOTAL, CKMB, CKMBINDEX, TROPONINI,  in the last 168 hours BNP (last 3 results) No results found for this basename: PROBNP,  in the last 8760 hours CBG: No results found for this basename: GLUCAP,  in the last 168 hours  Recent Results (from the past 240 hour(s))  URINE CULTURE     Status:  None   Collection Time    12/12/13  9:37 AM      Result Value Ref Range Status   Specimen Description URINE, CATHETERIZED   Final   Special Requests NONE   Final   Culture  Setup Time     Final   Value: 12/12/2013 18:58     Performed at [facility]     Final   Value: NO GROWTH     Performed at [facility]   Culture     Final   Value: NO GROWTH     Performed at [facility]   Report Status 12/13/2013 FINAL   Final  CULTURE, BLOOD (ROUTINE X 2)     Status: None   Collection Time    12/12/13 10:05 AM      Result Value Ref Range Status   Specimen Description BLOOD RIGHT ANTECUBITAL   Final   Special Requests BOTTLES DRAWN AEROBIC AND ANAEROBIC 5CC   Final   Culture  Setup Time     Final   Value: 12/12/2013 14:31     Performed at [facility]   Culture     Final   Value:        BLOOD CULTURE RECEIVED NO GROWTH TO DATE CULTURE WILL BE HELD FOR 5 DAYS BEFORE ISSUING A FINAL NEGATIVE REPORT     Performed at [facility]   Report Status PENDING   Incomplete  CULTURE, BLOOD (ROUTINE X 2)     Status: None   Collection Time    12/12/13 10:15 AM      Result Value Ref Range Status   Specimen Description BLOOD RIGHT WRIST   Final   Special Requests BOTTLES DRAWN AEROBIC AND ANAEROBIC 5CC   Final   Culture  Setup Time     Final   Value: 12/12/2013 14:31     Performed at [facility]   Culture     Final   Value:        BLOOD CULTURE RECEIVED NO GROWTH TO DATE CULTURE WILL BE HELD FOR 5 DAYS BEFORE ISSUING A FINAL NEGATIVE REPORT     Performed at [facility]   Report Status PENDING   Incomplete  CLOSTRIDIUM DIFFICILE BY PCR     Status: None   Collection Time    12/12/13  3:38 PM      Result Value Ref Range Status   C difficile by pcr NEGATIVE  NEGATIVE Final     Studies: X-ray Chest Pa And Lateral   12/13/2013   CLINICAL DATA:  Dyspnea and weakness  EXAM: CHEST  2 VIEW  COMPARISON:  DG CHEST 2 VIEW dated  12/12/2013  FINDINGS: The right lung is well-expanded and clear. On the left there is stable elevation of the hemidiaphragm. There is subsegmental atelectasis above the left hemidiaphragm that is slightly more conspicuous  today. Lung markings in the left upper lobe are more conspicuous today as well. The cardiac silhouette is normal in size. There are post CABG changes present. The pulmonary vascularity is not engorged. Marland Kitchen  IMPRESSION: The findings are consistent with subsegmental atelectasis in the left upper lobe and at the left lung base posteriorly. No discrete alveolar infiltrate nor pleural effusion is demonstrated. The appearance of the chest as deteriorated slightly since yesterday's study.   Electronically Signed   By: David  Martinique   On: 12/13/2013 08:00    Scheduled Meds: . aspirin  81 mg Oral Daily  . calcium acetate  667 mg Oral Q breakfast  . clorazepate  3.75 mg Oral QHS  . heparin  5,000 Units Subcutaneous 3 times per day  . multivitamin  1 tablet Oral QHS  . oseltamivir  30 mg Oral Q T,Th,Sat-1800  . sodium chloride  3 mL Intravenous Q12H   Continuous Infusions:   Active Problems:   CAD (coronary artery disease): Severe three vessel   ESRD (end stage renal disease) on dialysis   S/P CABG x 5, 08/15/12, LIMA-LAD;VG-diag;VG-OM; seq VG-PDA,PLA    Sepsis   Influenza A   CKD (chronic kidney disease) stage V requiring chronic dialysis   Physical deconditioning    Time spent: 25 min    [facility]  [facility] Pager [phone]*. If 7PM-7AM, please contact night-coverage at [email or web address], password [facility] 12/14/2013, 2:55 PM  LOS: 2 days

## 2013-12-14 NOTE — Evaluation (Signed)
Physical Therapy Evaluation Patient Details Name: Diane [name] MRN: [id number] DOB: 10-Jan-1935 Today's Date: 12/14/2013 Time: 3009-2330 PT Time Calculation (min): 30 min  PT Assessment / Plan / Recommendation History of Present Illness  Diane [name] is a 79 y.o. female with past medical history of end stage renal disease, hypertension and CAD. Patient came in to the hospital because of fever and chills. Patient said she was in her usual state of health until Thursday when she started to have running nose, cough soon that followed by fever and chills  Clinical Impression  Patient presents with decreased independence with mobility due to deficits listed below and will benefit from skilled PT in the acute setting to allow return home with daughter assist and HHPT.    PT Assessment  Patient needs continued PT services    Follow Up Recommendations  Supervision/Assistance - 24 hour;Home health PT    Does the patient have the potential to tolerate intense rehabilitation    N/A  Barriers to Discharge   unsure if daughter still able to provide 24 hour assist    Equipment Recommendations  Rolling walker with 5" wheels    Recommendations for Other Services   None  Frequency Min 3X/week    Precautions / Restrictions Precautions Precautions: Fall Precaution Comments: fell or "sank down" at home and with daughter's help unable to get up Restrictions Weight Bearing Restrictions: No   Pertinent Vitals/Pain C/o pain in feet from neuropathy "burning" RN aware      Mobility  Bed Mobility Overal bed mobility: Needs Assistance Bed Mobility: Sit to Supine Sit to supine: Supervision General bed mobility comments: able to come up to sit and to lie down with supervision and cues for positioning in bed Transfers Overall transfer level: Needs assistance Equipment used: 1 person hand held assist Transfers: Sit to/from Stand Sit to Stand: Min guard General transfer comment: assist  for safety, pt with multiple complaints of weakness and pain in feet due to neuropathy Ambulation/Gait Ambulation/Gait assistance: Min assist Ambulation Distance (Feet): 15 Feet (x 2) Assistive device: 1 person hand held assist Gait Pattern/deviations: Decreased stride length;Shuffle;Step-through pattern;Trunk flexed General Gait Details: assisted patient to and from bathroom with min to minguard assist for balance/due to weakness    Exercises     PT Diagnosis: Difficulty walking;Generalized weakness  PT Problem List: Decreased strength;Decreased activity tolerance;Decreased mobility;Decreased knowledge of use of DME;Decreased balance;Decreased safety awareness;Pain PT Treatment Interventions: DME instruction;Functional mobility training;Balance training;Patient/family education;Gait training;Therapeutic exercise;Stair training     PT Goals(Current goals can be found in the care plan section) Acute Rehab PT Goals Patient Stated Goal: To feel better PT Goal Formulation: With patient Time For Goal Achievement: 12/28/13 Potential to Achieve Goals: Good  Visit Information  Last PT Received On: 12/14/13 Assistance Needed: +1 History of Present Illness: Diane [name] is a 79 y.o. female with past medical history of end stage renal disease, hypertension and CAD. Patient came in to the hospital because of fever and chills. Patient said she was in her usual state of health until Thursday when she started to have running nose, cough soon that followed by fever and chills       Prior [place] expects to be discharged to:: Private residence Living Arrangements: Spouse/significant other Available Help at Discharge: Family Type of Home: House Home Access: Stairs to enter Technical brewer of Steps: 2 Entrance Stairs-Rails:  (post to hold) Home Layout: One level Home Equipment: None Additional Comments: spouse currently  at [facility];  daughter was on leave of absence to spend time with him then started to need to take care of her mom Prior Function Level of Independence: Independent Communication Communication: No difficulties Dominant Hand: Right    Cognition  Cognition Arousal/Alertness: Awake/alert Behavior During Therapy: Anxious Overall Cognitive Status: Within Functional Limits for tasks assessed    Extremity/Trunk Assessment Lower Extremity Assessment Lower Extremity Assessment: Generalized weakness (c/o neuropathic pain in feet)   Balance Balance Overall balance assessment: Needs assistance;History of Falls Sitting-balance support: No upper extremity supported Sitting balance-Leahy Scale: Good Standing balance support: Single extremity supported;No upper extremity supported;During functional activity Standing balance-Leahy Scale: Fair Standing balance comment: stood at sink for washing hands, rinsing mouth, etc about 4-5 minutes with supervision   End of Session PT - End of Session Activity Tolerance: Patient limited by fatigue Patient left: in bed;with bed alarm set;with call bell/phone within reach Nurse Communication: Patient requests pain meds  GP     [facility] 12/14/2013, 2:56 PM [place], [place] 12/14/2013

## 2013-12-14 NOTE — Procedures (Signed)
I was present at this dialysis session, have reviewed the session itself and made  appropriate changes  Kelly Splinter MD (pgr) [phone]    ([phone] 12/14/2013, 8:51 AM

## 2013-12-15 LAB — BASIC METABOLIC PANEL
BUN: 25 mg/dL — ABNORMAL HIGH (ref 6–23)
CHLORIDE: 98 meq/L (ref 96–112)
CO2: 27 mEq/L (ref 19–32)
CREATININE: 4.44 mg/dL — AB (ref 0.50–1.10)
Calcium: 8.5 mg/dL (ref 8.4–10.5)
GFR calc non Af Amer: 9 mL/min — ABNORMAL LOW (ref >90)
GFR, EST AFRICAN AMERICAN: 10 mL/min — AB (ref >90)
Glucose, Bld: 101 mg/dL — ABNORMAL HIGH (ref 70–99)
Potassium: 3.8 mEq/L (ref 3.7–5.3)
Sodium: 140 mEq/L (ref 137–147)

## 2013-12-15 LAB — T3, FREE: T3, Free: 4.1 pg/mL (ref 2.3–4.2)

## 2013-12-15 LAB — T4: T4, Total: 16.7 ug/dL — ABNORMAL HIGH (ref 5.0–12.5)

## 2013-12-15 LAB — TSH: TSH: 3.167 u[IU]/mL (ref 0.350–4.500)

## 2013-12-15 MED ORDER — MOMETASONE FUROATE 0.1 % EX CREA
TOPICAL_CREAM | Freq: Every day | CUTANEOUS | Status: DC
  Administered 2013-12-15 – 2013-12-16 (×2): via TOPICAL
  Filled 2013-12-15: qty 15

## 2013-12-15 NOTE — Progress Notes (Signed)
Subjective:   Feeling much better. No diarrhea today. Upset paper tape was not used yesterday.  Objective Filed Vitals:   12/14/13 2100 12/15/13 0500 12/15/13 0902 12/15/13 1240  BP: 100/50 92/41 101/45 106/47  Pulse: 91 83 97 75  Temp: 99 F (37.2 C) 99 F (37.2 C) 99.3 F (37.4 C) 99.3 F (37.4 C)  TempSrc: Oral Oral Oral Oral  Resp: 16 16 18 18   Height:      Weight:      SpO2: 97% 95% 99% 96%   Physical Exam General: alert and oriented. No acute distress.  Heart: Tachy. RRR S1 S2 no murmur Lungs: CTA, unlabored Abdomen: soft nontender +BS Extremities: no edema Dialysis Access: LUA AVG +bruit/thrill  Dialysis: TTS [facility]  3.5h 53.5kg 2K/2.25 Bath Heparin 5000 L AVG  Hectorol 1 Epo none Venofer none  Assessment/Plan: 1. Influenza- Tmax 99.3. WBC 7.3 Blood culture NGTD. Tamiflu and symptomatic tx 2. ESRD - TTS @ [facility]. HD tomorrow K+3.8. Under EDW, lower at DC 3. Anemia -  hgb 10.4, ESA started.  4. Secondary hyperparathyroidism - Ca+ 8.5. Phos 4.6. Cont phoslo and hectorol. 5. HTN/volume - 106/47. Home metop on hold 6. Nutrition - appetite improving. Renal diet. multivit 7. Diarrhea- improving. Cdiff negative. On immodium  Shelle Iron, NP [place] [phone] 12/15/2013,1:56 PM  LOS: 3 days   I have seen and examined patient, discussed with PA and agree with assessment and plan as outlined above. Kelly Splinter MD pager [phone]    cell [phone] 12/15/2013, 3:37 PM    Additional Objective Labs: Basic Metabolic Panel:  Recent Labs Lab 12/13/13 0240 12/14/13 0540 12/15/13 0500  NA 145 147 140  K 4.9 5.5* 3.8  CL 113* 116* 98  CO2 18* 16* 27  GLUCOSE 104* 118* 101*  BUN 53* 58* 25*  CREATININE 6.47* 6.77* 4.44*  CALCIUM 8.0* 8.5 8.5  PHOS  --  4.6  --    Liver Function Tests:  Recent Labs Lab 12/14/13 0540  ALBUMIN 3.0*   No results found for this basename: LIPASE, AMYLASE,  in the last 168 hours CBC:  Recent  Labs Lab 12/12/13 1015 12/13/13 0240 12/14/13 0818  WBC 8.4 6.6 7.3  NEUTROABS 7.0  --   --   HGB 11.0* 9.8* 10.4*  HCT 33.5* 29.6* 31.3*  MCV 94.4 95.5 94.8  PLT 100* 81* 86*   Blood Culture    Component Value Date/Time   SDES BLOOD RIGHT WRIST 12/12/2013 1015   SPECREQUEST BOTTLES DRAWN AEROBIC AND ANAEROBIC 5CC 12/12/2013 1015   CULT  Value:        BLOOD CULTURE RECEIVED NO GROWTH TO DATE CULTURE WILL BE HELD FOR 5 DAYS BEFORE ISSUING A FINAL NEGATIVE REPORT Performed at [facility] 12/12/2013 1015   REPTSTATUS PENDING 12/12/2013 1015    Cardiac Enzymes: No results found for this basename: CKTOTAL, CKMB, CKMBINDEX, TROPONINI,  in the last 168 hours CBG: No results found for this basename: GLUCAP,  in the last 168 hours [place] Studies: No results found for this basename: IRON, TIBC, TRANSFERRIN, FERRITIN,  in the last 72 hours @lablastinr3 @ Studies/Results: No results found. Medications:   . aspirin  81 mg Oral Daily  . calcium acetate  667 mg Oral Q breakfast  . clorazepate  3.75 mg Oral QHS  . heparin  5,000 Units Subcutaneous 3 times per day  . multivitamin  1 tablet Oral QHS  . oseltamivir  30 mg Oral Q T,Th,Sat-1800  .  sodium chloride  3 mL Intravenous Q12H

## 2013-12-15 NOTE — Progress Notes (Signed)
Physical Therapy Treatment Patient Details Name: Diane [name] MRN: [id number] DOB: [birth year] Today's Date: 12/15/2013 Time: 8185-6314 PT Time Calculation (min): 26 min  PT Assessment / Plan / Recommendation  History of Present Illness Diane [name] is a 79 y.o. female with past medical history of end stage renal disease, hypertension and CAD. Patient came in to the hospital because of fever and chills. Patient said she was in her usual state of health until Thursday when she started to have running nose, cough soon that followed by fever and chills   PT Comments   Patient much improved as compared to yesterday.  Able to walk unaided in hallway and moving to/from bathroom in room safely.  Feel she can go home with intermittent help from daughter and HHPT for safety eval.  Patient encouraged to set up alternate transportation for dialysis and she voices good plan.    Follow Up Recommendations  Home health PT;Supervision - Intermittent     Does the patient have the potential to tolerate intense rehabilitation   N/A  Barriers to Discharge  None      Equipment Recommendations  None recommended by PT    Recommendations for Other Services  None  Frequency Min 3X/week   Progress towards PT Goals Progress towards PT goals: Progressing toward goals  Plan Equipment recommendations need to be updated;Discharge plan needs to be updated    Precautions / Restrictions Precautions Precautions: Fall   Pertinent Vitals/Pain No pain complaints    Mobility  Bed Mobility Overal bed mobility: Modified Independent Transfers Overall transfer level: Modified independent Transfers: Sit to/from Stand Sit to Stand: Modified independent (Device/Increase time) General transfer comment: performed x 5 for LE strengthening Ambulation/Gait Ambulation/Gait assistance: Min guard;Supervision Ambulation Distance (Feet): 280 Feet Assistive device: None Gait Pattern/deviations: Step-through  pattern;Decreased stride length General Gait Details: mild imbalance noted with quick turns, able to move in room to/from bathroom independent. Encouraged walks in hallway with nursing assist frequently    Exercises Other Exercises Other Exercises: sit<>stand x 5      PT Goals (current goals can now be found in the care plan section)    Visit Information  Last PT Received On: 12/15/13 Assistance Needed: +1 History of Present Illness: Diane [name] is a 79 y.o. female with past medical history of end stage renal disease, hypertension and CAD. Patient came in to the hospital because of fever and chills. Patient said she was in her usual state of health until Thursday when she started to have running nose, cough soon that followed by fever and chills    Subjective Data   I feel much better   Cognition  Cognition Arousal/Alertness: Awake/alert Behavior During Therapy: WFL for tasks assessed/performed Overall Cognitive Status: Within Functional Limits for tasks assessed    Balance  Balance Standing balance-Leahy Scale: Good  End of Session PT - End of Session Activity Tolerance: Patient tolerated treatment well Patient left: in bed;with call bell/phone within reach Nurse Communication: Mobility status   GP     [facility] 12/15/2013, 4:52 PM Magda Kiel, [place] 12/15/2013

## 2013-12-15 NOTE — Clinical Social Work Placement (Addendum)
Clinical Social Work Department CLINICAL SOCIAL WORK PLACEMENT NOTE 12/15/2013  Patient:  Diane [name], Diane [name]  Account Number:  [id number] Admit date:  12/12/2013  Clinical Social Worker:  Jevaun Strick Givens, LCSW  Date/time:  12/15/2013 05:33 AM  Clinical Social Work is seeking post-discharge placement for this patient at the following level of care:   SKILLED NURSING   (*CSW will update this form in Epic as items are completed)     Patient/family provided with [place] Department of Clinical Social Work's list of facilities offering this level of care within the geographic area requested by the patient (or if unable, by the patient's family).  12/15/2013  Patient/family informed of their freedom to choose among providers that offer the needed level of care, that participate in Medicare, Medicaid or managed care program needed by the patient, have an available bed and are willing to accept the patient.    Patient/family informed of [facility]' ownership interest in [facility], as well as of the fact that they are under no obligation to receive care at this facility.  PASARR submitted to EDS on 12/15/13 PASARR number received from EDS on 12/16/13  FL2 transmitted to all facilities in geographic area requested by pt/family on  12/15/2013 FL2 transmitted to all facilities within larger geographic area on   Patient informed that his/her managed care company has contracts with or will negotiate with  certain facilities, including the following:     Patient/family informed of bed offers received: 11/2513   Patient chooses bed at [facility] Physician recommends and patient chooses bed at    Patient to be transferred to [facility] on 12/16/13   Patient to be transferred to facility by ambulance  The following physician request were entered in Epic:   Additional Comments:

## 2013-12-15 NOTE — Clinical Social Work Psychosocial (Signed)
Clinical Social Work Department BRIEF PSYCHOSOCIAL ASSESSMENT 12/15/2013  Patient:  Diane [name], Diane [name]     Account Number:  [id number]     Admit date:  12/12/2013  Clinical Social Worker:  Frederico Hamman  Date/Time:  12/15/2013 05:07 AM  Referred by:  Physician  Date Referred:  12/15/2013 Referred for  SNF Placement   Other Referral:   Interview type:  Patient Other interview type:   CSW talked with daughters Derenda Fennel ([phone] & [phone]-home) and Bernell List [phone]).    PSYCHOSOCIAL DATA Living Status:  HUSBAND Admitted from facility:   Level of care:   Primary support name:  Derenda Fennel Primary support relationship to patient:  CHILD, ADULT Degree of support available:   Both daughters apprears caring and supportive of both parents.    CURRENT CONCERNS Current Concerns  Post-Acute Placement   Other Concerns:    SOCIAL WORK ASSESSMENT / PLAN CSW initially talked with patient about discharge planning and recommendation of ST rehab. Patient reported that her husband Herbie Baltimore is currently sick (prostrate cancer) and is at [facility] for short-term care and is transported to his chemo treatments. She also reported that her daughter Butch Penny has taken a leave of absence from work to help out with her dad. When asked, patient stated that she did not want to go to [facility] even though her husband  is there. Patient is hopeful that because her daughter is LOA from work that she can discharge home with help from her daughter. When asked, CSW was given permission to contact her daughter regarding discharge plans.    CSW talked with daughter Butch Penny first by phone and then later talked with her and sister Bernell List on the unit regarding discharge plans for patient. CSW was informed that their father was moved today to [facility] skilled nursing facility and this is where they want the patient to go. Ms. Edmonia James stated that she would not be able to  provide care at home to patient as she will be returning to work on 12/24/13 and her sister Hilda Blades (lives in [place]) is also unable to assist patient with 24/7 care post-discharge from hospital.   Assessment/plan status:  Psychosocial Support/Ongoing Assessment of Needs Other assessment/ plan:   Information/referral to community resources:    PATIENT'S/FAMILY'S RESPONSE TO PLAN OF CARE: Patient was very pleasant, talkative and agreeable to SNF for rehab, although her first choice is home with help from her daughter. Patient's daughters very receptive to talking with CSW about discharge planning. Facility preference is [facility].

## 2013-12-15 NOTE — Progress Notes (Signed)
Patient Demographics  Diane [name], is a 79 y.o. female, DOB - May 28, 1935, WNI:[id number]  Admit date - 12/12/2013   Admitting Physician Verlee Monte, MD  Outpatient Primary MD for the patient is Donnajean Lopes, MD  LOS - 3   Chief Complaint  Patient presents with  . Weakness  . Fever        Assessment & Plan    SIRS  -Due to influnza , much improved with Tamiflu and supportive care.    Diarrhea  - Stool for Cdiff PCR is negative, Will start Imodium prn     Influenza A  - continue Tamiflu and supportive care     ESRD requiring chronic dialysis  -Per nephrology     Chronic Hypotension  - BP has improved.     Chronic Leg/feet pain  Much better on as needed pain medication.    Physical deconditioning  -Patient cares for her debilitated husband at home  -Currently very weak  -PT evaluation social work requested to arrange for placement.    CAD (coronary artery disease)  Severe three vessel/post CABG x 5, 08/15/12, LIMA-LAD;VG-diag;VG-OM; seq VG-PDA,PLA  No acute issues compensated.     Noted low TSH ordered upon admission.  Repeat TSH, free T3 and T4.    Code Status: Full  Family Communication:    Disposition Plan: TBD   Procedures     Consults  Renal   Medications  Scheduled Meds: . aspirin  81 mg Oral Daily  . calcium acetate  667 mg Oral Q breakfast  . clorazepate  3.75 mg Oral QHS  . heparin  5,000 Units Subcutaneous 3 times per day  . multivitamin  1 tablet Oral QHS  . oseltamivir  30 mg Oral Q T,Th,Sat-1800  . sodium chloride  3 mL Intravenous Q12H   Continuous Infusions:  PRN Meds:.HYDROcodone-acetaminophen, hydrOXYzine, loperamide, olopatadine  DVT Prophylaxis   Heparin    Lab Results  Component Value Date   PLT 86*  12/14/2013    Antibiotics    Anti-infectives   Start     Dose/Rate Route Frequency Ordered Stop   12/14/13 1800  oseltamivir (TAMIFLU) capsule 30 mg     30 mg Oral Every T-Th-Sa (1800) 12/13/13 1207 12/18/13 1759   12/14/13 1200  vancomycin (VANCOCIN) 500 mg in sodium chloride 0.9 % 100 mL IVPB  Status:  Discontinued     500 mg 100 mL/hr over 60 Minutes Intravenous Every T-Th-Sa (Hemodialysis) 12/12/13 1001 12/12/13 1719   12/12/13 2200  oseltamivir (TAMIFLU) capsule 30 mg  Status:  Discontinued     30 mg Oral 2 times daily 12/12/13 1719 12/13/13 1207   12/12/13 1215  oseltamivir (TAMIFLU) capsule 30 mg     30 mg Oral To Emergency Dept 12/12/13 1148 12/12/13 1357   12/12/13 1200  oseltamivir (TAMIFLU) capsule 75 mg  Status:  Discontinued     75 mg Oral  Once 12/12/13 1145 12/12/13 1148   12/12/13 1030  vancomycin (VANCOCIN) 1,250 mg in sodium chloride 0.9 % 250 mL IVPB     1,250 mg 166.7 mL/hr over 90 Minutes Intravenous NOW 12/12/13 0956 12/12/13 1400   12/12/13 0945  piperacillin-tazobactam (ZOSYN) IVPB 3.375 g     3.375 g 100 mL/hr over 30  Minutes Intravenous  Once 12/12/13 0943 12/12/13 1101          Subjective:   Diane [name] today has, No headache, No chest pain, No abdominal pain - No Nausea, No new weakness tingling or numbness, No Cough - SOB.    Objective:   Filed Vitals:   12/14/13 1633 12/14/13 2100 12/15/13 0500 12/15/13 0902  BP: 89/39 100/50 92/41 101/45  Pulse: 88 91 83 97  Temp: 100 F (37.8 C) 99 F (37.2 C) 99 F (37.2 C) 99.3 F (37.4 C)  TempSrc:  Oral Oral Oral  Resp: 15 16 16 18   Height:      Weight:      SpO2: 93% 97% 95% 99%    Wt Readings from Last 3 Encounters:  12/14/13 51.2 kg (112 lb 14 oz)  08/11/13 53.978 kg (119 lb)  09/15/12 50.803 kg (112 lb)     Intake/Output Summary (Last 24 hours) at 12/15/13 1239 Last data filed at 12/15/13 0903  Gross per 24 hour  Intake    460 ml  Output      1 ml  Net    459 ml      Physical Exam  Awake Alert, Oriented X 3, No new F.N deficits, Normal affect [place].AT,PERRAL Supple Neck,No JVD, No cervical lymphadenopathy appriciated.  Symmetrical Chest wall movement, Good air movement bilaterally, CTAB RRR,No Gallops,Rubs or new Murmurs, No Parasternal Heave +ve B.Sounds, Abd Soft, Non tender, No organomegaly appriciated, No rebound - guarding or rigidity. No Cyanosis, Clubbing or edema, No new Rash or bruise      Data Review   Micro Results Recent Results (from the past 240 hour(s))  URINE CULTURE     Status: None   Collection Time    12/12/13  9:37 AM      Result Value Ref Range Status   Specimen Description URINE, CATHETERIZED   Final   Special Requests NONE   Final   Culture  Setup Time     Final   Value: 12/12/2013 18:58     Performed at [facility]     Final   Value: NO GROWTH     Performed at [facility]   Culture     Final   Value: NO GROWTH     Performed at [facility]   Report Status 12/13/2013 FINAL   Final  CULTURE, BLOOD (ROUTINE X 2)     Status: None   Collection Time    12/12/13 10:05 AM      Result Value Ref Range Status   Specimen Description BLOOD RIGHT ANTECUBITAL   Final   Special Requests BOTTLES DRAWN AEROBIC AND ANAEROBIC 5CC   Final   Culture  Setup Time     Final   Value: 12/12/2013 14:31     Performed at [facility]   Culture     Final   Value:        BLOOD CULTURE RECEIVED NO GROWTH TO DATE CULTURE WILL BE HELD FOR 5 DAYS BEFORE ISSUING A FINAL NEGATIVE REPORT     Performed at [facility]   Report Status PENDING   Incomplete  CULTURE, BLOOD (ROUTINE X 2)     Status: None   Collection Time    12/12/13 10:15 AM      Result Value Ref Range Status   Specimen Description BLOOD RIGHT WRIST   Final   Special Requests BOTTLES DRAWN AEROBIC AND ANAEROBIC 5CC  Final   Culture  Setup Time     Final   Value: 12/12/2013 14:31     Performed at [facility]    Culture     Final   Value:        BLOOD CULTURE RECEIVED NO GROWTH TO DATE CULTURE WILL BE HELD FOR 5 DAYS BEFORE ISSUING A FINAL NEGATIVE REPORT     Performed at [facility]   Report Status PENDING   Incomplete  CLOSTRIDIUM DIFFICILE BY PCR     Status: None   Collection Time    12/12/13  3:38 PM      Result Value Ref Range Status   C difficile by pcr NEGATIVE  NEGATIVE Final    Radiology Reports X-ray Chest Pa And Lateral   12/13/2013   CLINICAL DATA:  Dyspnea and weakness  EXAM: CHEST  2 VIEW  COMPARISON:  DG CHEST 2 VIEW dated 12/12/2013  FINDINGS: The right lung is well-expanded and clear. On the left there is stable elevation of the hemidiaphragm. There is subsegmental atelectasis above the left hemidiaphragm that is slightly more conspicuous today. Lung markings in the left upper lobe are more conspicuous today as well. The cardiac silhouette is normal in size. There are post CABG changes present. The pulmonary vascularity is not engorged. Marland Kitchen  IMPRESSION: The findings are consistent with subsegmental atelectasis in the left upper lobe and at the left lung base posteriorly. No discrete alveolar infiltrate nor pleural effusion is demonstrated. The appearance of the chest as deteriorated slightly since yesterday's study.   Electronically Signed   By: David  [place]   On: 12/13/2013 08:00   Dg Chest 2 View  12/12/2013   CLINICAL DATA:  Fever and cough  EXAM: CHEST  2 VIEW  COMPARISON:  09/15/2012  FINDINGS: Left hemidiaphragm remains elevated with basilar atelectasis. Lungs are otherwise clear. No pleural effusion and no pneumothorax. Postoperative changes from CABG. Normal heart size.  IMPRESSION: No active cardiopulmonary disease. Chronic and postoperative changes are noted.   Electronically Signed   By: Maryclare Bean M.D.   On: 12/12/2013 11:40    CBC  Recent Labs Lab 12/12/13 1015 12/13/13 0240 12/14/13 0818  WBC 8.4 6.6 7.3  HGB 11.0* 9.8* 10.4*  HCT 33.5* 29.6* 31.3*  PLT  100* 81* 86*  MCV 94.4 95.5 94.8  [name] 31.0 31.6 31.5  MCHC 32.8 33.1 33.2  RDW 13.3 13.6 13.8  LYMPHSABS 0.8  --   --   MONOABS 0.5  --   --   EOSABS 0.1  --   --   BASOSABS 0.0  --   --     Chemistries   Recent Labs Lab 12/12/13 1015 12/13/13 0240 12/14/13 0540 12/15/13 0500  NA 142 145 147 140  K 4.7 4.9 5.5* 3.8  CL 104 113* 116* 98  CO2 19 18* 16* 27  GLUCOSE 114* 104* 118* 101*  BUN 49* 53* 58* 25*  CREATININE 6.53* 6.47* 6.77* 4.44*  CALCIUM 9.0 8.0* 8.5 8.5   ------------------------------------------------------------------------------------------------------------------ estimated creatinine clearance is 8.4 ml/min (by C-G formula based on Cr of 4.44). ------------------------------------------------------------------------------------------------------------------  Recent Labs  12/13/13 1640  HGBA1C 5.6   ------------------------------------------------------------------------------------------------------------------ No results found for this basename: CHOL, HDL, LDLCALC, TRIG, CHOLHDL, LDLDIRECT,  in the last 72 hours ------------------------------------------------------------------------------------------------------------------  Recent Labs  12/13/13 0240  TSH 0.265*   ------------------------------------------------------------------------------------------------------------------ No results found for this basename: VITAMINB12, FOLATE, FERRITIN, TIBC, IRON, RETICCTPCT,  in the last 72 hours  Coagulation profile  No results found for this basename: INR, PROTIME,  in the last 168 hours  No results found for this basename: DDIMER,  in the last 72 hours  Cardiac Enzymes No results found for this basename: CK, CKMB, TROPONINI, MYOGLOBIN,  in the last 168 hours ------------------------------------------------------------------------------------------------------------------ No components found with this basename: POCBNP,      Time Spent in minutes    35   Shanele Nissan K M.D on 12/15/2013 at 12:39 PM  Between 7am to 7pm - Pager - [phone]  After 7pm go to [email or web address] - password [email or web address]  And look for the night coverage person covering for me after hours  [facility] Office  [phone]

## 2013-12-16 LAB — RENAL FUNCTION PANEL
Albumin: 3.6 g/dL (ref 3.5–5.2)
BUN: 43 mg/dL — AB (ref 6–23)
CHLORIDE: 98 meq/L (ref 96–112)
CO2: 21 meq/L (ref 19–32)
Calcium: 9.4 mg/dL (ref 8.4–10.5)
Creatinine, Ser: 5.52 mg/dL — ABNORMAL HIGH (ref 0.50–1.10)
GFR calc non Af Amer: 7 mL/min — ABNORMAL LOW (ref >90)
GFR, EST AFRICAN AMERICAN: 8 mL/min — AB (ref >90)
GLUCOSE: 142 mg/dL — AB (ref 70–99)
PHOSPHORUS: 4.8 mg/dL — AB (ref 2.3–4.6)
POTASSIUM: 3.5 meq/L — AB (ref 3.7–5.3)
SODIUM: 140 meq/L (ref 137–147)

## 2013-12-16 LAB — CBC
HEMATOCRIT: 33.7 % — AB (ref 36.0–46.0)
Hemoglobin: 11.8 g/dL — ABNORMAL LOW (ref 12.0–15.0)
MCH: 31.3 pg (ref 26.0–34.0)
MCHC: 35 g/dL (ref 30.0–36.0)
MCV: 89.4 fL (ref 78.0–100.0)
Platelets: 104 10*3/uL — ABNORMAL LOW (ref 150–400)
RBC: 3.77 MIL/uL — AB (ref 3.87–5.11)
RDW: 12.9 % (ref 11.5–15.5)
WBC: 6.2 10*3/uL (ref 4.0–10.5)

## 2013-12-16 LAB — NOROVIRUS GROUP 1 & 2 BY PCR, STOOL

## 2013-12-16 MED ORDER — OSELTAMIVIR PHOSPHATE 30 MG PO CAPS: 30.0000 mg | ORAL_CAPSULE | ORAL | Status: DC

## 2013-12-16 MED ORDER — PENTAFLUOROPROP-TETRAFLUOROETH EX AERO: 1.0000 "application " | INHALATION_SPRAY | CUTANEOUS | Status: DC | PRN

## 2013-12-16 MED ORDER — HEPARIN SODIUM (PORCINE) 1000 UNIT/ML DIALYSIS: 1000.0000 [IU] | INTRAMUSCULAR | Status: DC | PRN

## 2013-12-16 MED ORDER — NEPRO/CARBSTEADY PO LIQD: 237.0000 mL | ORAL | Status: DC | PRN

## 2013-12-16 MED ORDER — LIDOCAINE-PRILOCAINE 2.5-2.5 % EX CREA: 1.0000 "application " | TOPICAL_CREAM | CUTANEOUS | Status: DC | PRN

## 2013-12-16 MED ORDER — HEPARIN SODIUM (PORCINE) 1000 UNIT/ML DIALYSIS: 5000.0000 [IU] | Freq: Once | INTRAMUSCULAR | Status: DC

## 2013-12-16 MED ORDER — LIDOCAINE HCL (PF) 1 % IJ SOLN: 5.0000 mL | INTRAMUSCULAR | Status: DC | PRN

## 2013-12-16 MED ORDER — ALTEPLASE 2 MG IJ SOLR: 2.0000 mg | Freq: Once | INTRAMUSCULAR | Status: DC | PRN

## 2013-12-16 MED ORDER — CLORAZEPATE DIPOTASSIUM 3.75 MG PO TABS: 3.7500 mg | ORAL_TABLET | Freq: Every day | ORAL | Status: DC

## 2013-12-16 MED ORDER — SODIUM CHLORIDE 0.9 % IV SOLN: 100.0000 mL | INTRAVENOUS | Status: DC | PRN

## 2013-12-16 NOTE — Plan of Care (Signed)
Problem: Discharge Progression Outcomes Goal: Discharge plan in place and appropriate Outcome: Completed/Met Date Met:  12/16/13 Pt to be discharged to [facility] for short term rehab.

## 2013-12-16 NOTE — Discharge Summary (Signed)
Diane [name], is a 79 y.o. female  DOB 1935-05-04  MRN [id number].  Admission date:  12/12/2013  Admitting Physician  Verlee Monte, MD  Discharge Date:  12/16/2013   Primary MD  Donnajean Lopes, MD  Recommendations for primary care physician for things to follow:   Follow blood pressure, CBC BMP and clinical resolution of influenza   Admission Diagnosis  Influenza A [487.1] Peripheral neuropathy [356.9] ESRD (end stage renal disease) on dialysis [585.6] S/P CABG x 5 [V45.81] Hypotension [458.9] Influenza [487.1] Sepsis [038.9, 995.91]   Discharge Diagnosis  Influenza A [487.1] Peripheral neuropathy [356.9] ESRD (end stage renal disease) on dialysis [585.6] S/P CABG x 5 [V45.81] Hypotension [458.9] Influenza [487.1] Sepsis [038.9, 995.91]    Active Problems:   CAD (coronary artery disease): Severe three vessel   ESRD (end stage renal disease) on dialysis   S/P CABG x 5, 08/15/12, LIMA-LAD;VG-diag;VG-OM; seq VG-PDA,PLA    Sepsis   Influenza A   CKD (chronic kidney disease) stage V requiring chronic dialysis   Physical deconditioning      Past Medical History  Diagnosis Date  . Anemia in chronic kidney disease(285.21)   . Bipolar disorder, unspecified   . Iron deficiency anemia, unspecified   . Secondary hyperparathyroidism (of renal origin)   . Restless legs syndrome (RLS)   . Unspecified hypertensive kidney disease with chronic kidney disease stage V or end stage renal disease   . 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 Personal history of colonic polyps   . Diverticulosis of colon (without mention of hemorrhage) 2006    Colonoscopy Dr. Oletta Lamas  . Coronary artery disease   . Anginal pain   . S/P CABG x 5, 08/15/12, LIMA-LAD;VG-diag;VG-OM; seq  VG-PDA,PLA  08/18/2012    Past Surgical History  Procedure Laterality Date  . Arteriovenous graft placement  11/2010    left upper - AVGG Dr. Donnetta Hutching  . Arteriovenous graft placement  12/2009    left lower - AV[facility] Dr. Donnetta Hutching  . Av fistula placement  07/2009    right upper AVF Dr. Donnetta Hutching  . Av fistula placement  01/2002    right lower Dr. Donnetta Hutching  . Cardiac catheterization  08/12/2012  . Abdominal hysterectomy    . Coronary artery bypass graft  08/14/2012    Procedure: CORONARY ARTERY BYPASS GRAFTING (CABG);  Surgeon: Gaye Pollack, MD;  Location: [facility];  Service: Open Heart Surgery;  Laterality: N/A;  times five, using left internal mammary  . Endovein harvest of greater saphenous vein  08/14/2012    Procedure: ENDOVEIN HARVEST OF GREATER SAPHENOUS VEIN;  Surgeon: Gaye Pollack, MD;  Location: [facility];  Service: Open Heart Surgery;  Laterality: Bilateral;     Discharge Condition: Stable   Follow UP  Follow-up Information   Follow up with Donnajean Lopes, MD. Schedule an appointment as soon as possible for a visit in 1 week.   Specialty:  Internal Medicine   Contact  information:   [place] [address] [phone]         Discharge Instructions  and  Discharge Medications      Discharge Orders   Future Orders Complete By Expires   Diet - low sodium heart healthy  As directed    Discharge instructions  As directed    Comments:     Follow with Primary MD Donnajean Lopes, MD in 7 days   Get CBC, CMP, checked 7 days by Primary MD and again as instructed by your Primary MD. Get a 2 view Chest X ray done next visit.   Activity: As tolerated with Full fall precautions use walker/cane & assistance as needed   Disposition SNF   Diet: Heart Healthy - Renal    Check your Weight same time everyday, if you gain over 2 pounds, or you develop in leg swelling, experience more shortness of breath or chest pain, call your Primary MD immediately. Follow Cardiac  Low Salt Diet and 1.5 lit/day fluid restriction.   On your next visit with her primary care physician please Get Medicines reviewed and adjusted.  Please request your Prim.MD to go over all Hospital Tests and Procedure/Radiological results at the follow up, please get all Hospital records sent to your Prim MD by signing hospital release before you go home.   If you experience worsening of your admission symptoms, develop shortness of breath, life threatening emergency, suicidal or homicidal thoughts you must seek medical attention immediately by calling 911 or calling your MD immediately  if symptoms less severe.  You Must read complete instructions/literature along with all the possible adverse reactions/side effects for all the Medicines you take and that have been prescribed to you. Take any new Medicines after you have completely understood and accpet all the possible adverse reactions/side effects.   Do not drive and provide baby sitting services if your were admitted for syncope or siezures until you have seen by Primary MD or a Neurologist and advised to do so again.  Do not drive when taking Pain medications.    Do not take more than prescribed Pain, Sleep and Anxiety Medications  Special Instructions: If you have smoked or chewed Tobacco  in the last 2 yrs please stop smoking, stop any regular Alcohol  and or any Recreational drug use.  Wear Seat belts while driving.   Please note  You were cared for by a hospitalist during your hospital stay. If you have any questions about your discharge medications or the care you received while you were in the hospital after you are discharged, you can call the unit and asked to speak with the hospitalist on call if the hospitalist that took care of you is not available. Once you are discharged, your primary care physician will handle any further medical issues. Please note that NO REFILLS for any discharge medications will be authorized once  you are discharged, as it is imperative that you return to your primary care physician (or establish a relationship with a primary care physician if you do not have one) for your aftercare needs so that they can reassess your need for medications and monitor your lab values.   Increase activity slowly  As directed        Medication List         acetaminophen 500 MG tablet  Commonly known as:  TYLENOL  Take 1,000 mg by mouth every 6 (six) hours as needed. For pain     ALAWAY  OP  Place 1 drop into both eyes 2 (two) times daily as needed. For allergies     aspirin 81 MG tablet  Take 81 mg by mouth daily.     b complex-vitamin c-folic acid 0.8 MG Tabs tablet  Take 0.8 mg by mouth daily.     calcium acetate 667 MG capsule  Commonly known as:  PHOSLO  Take 1 capsule by mouth daily.     clorazepate 3.75 MG tablet  Commonly known as:  TRANXENE  Take 1 tablet (3.75 mg total) by mouth at bedtime. For anxiety     feeding supplement (NEPRO CARB STEADY) Liqd  Take 237 mLs by mouth as needed (missed meal during dialysis.).     gabapentin 100 MG capsule  Commonly known as:  NEURONTIN  Take 200 mg by mouth 2 (two) times daily.     hydrOXYzine 50 MG tablet  Commonly known as:  ATARAX/VISTARIL  Take 50 mg by mouth at bedtime.     metoprolol tartrate 25 MG tablet  Commonly known as:  LOPRESSOR  Take 12.5 mg by mouth 2 (two) times daily.     mometasone 0.1 % cream  Commonly known as:  ELOCON  Apply 1 application topically as needed (for itching from tape).     NITROSTAT 0.4 MG SL tablet  Generic drug:  nitroGLYCERIN  Place 0.4 mg under the tongue every 5 (five) minutes as needed.     olopatadine 0.1 % ophthalmic solution  Commonly known as:  PATANOL  Place 1 drop into both eyes 2 (two) times daily as needed (for allergies).     oseltamivir 30 MG capsule  Commonly known as:  TAMIFLU  Take 1 capsule (30 mg total) by mouth every Tuesday, Thursday, and Saturday at 6 PM. 2 more doses      polyethylene glycol packet  Commonly known as:  MIRALAX / GLYCOLAX  Take 17 g by mouth daily as needed. For constipation     sevelamer 800 MG tablet  Commonly known as:  RENAGEL  Take 800 mg by mouth 2 (two) times daily.          Diet and Activity recommendation: See Discharge Instructions above   Consults obtained - Renal   Major procedures and Radiology Reports - PLEASE review detailed and final reports for all details, in brief -       X-ray Chest Pa And Lateral   12/13/2013   CLINICAL DATA:  Dyspnea and weakness  EXAM: CHEST  2 VIEW  COMPARISON:  DG CHEST 2 VIEW dated 12/12/2013  FINDINGS: The right lung is well-expanded and clear. On the left there is stable elevation of the hemidiaphragm. There is subsegmental atelectasis above the left hemidiaphragm that is slightly more conspicuous today. Lung markings in the left upper lobe are more conspicuous today as well. The cardiac silhouette is normal in size. There are post CABG changes present. The pulmonary vascularity is not engorged. Marland Kitchen  IMPRESSION: The findings are consistent with subsegmental atelectasis in the left upper lobe and at the left lung base posteriorly. No discrete alveolar infiltrate nor pleural effusion is demonstrated. The appearance of the chest as deteriorated slightly since yesterday's study.   Electronically Signed   By: David  Martinique   On: 12/13/2013 08:00   Dg Chest 2 View  12/12/2013   CLINICAL DATA:  Fever and cough  EXAM: CHEST  2 VIEW  COMPARISON:  09/15/2012  FINDINGS: Left hemidiaphragm remains elevated with basilar atelectasis. Lungs are otherwise clear.  No pleural effusion and no pneumothorax. Postoperative changes from CABG. Normal heart size.  IMPRESSION: No active cardiopulmonary disease. Chronic and postoperative changes are noted.   Electronically Signed   By: Maryclare Bean M.D.   On: 12/12/2013 11:40    Micro Results      Recent Results (from the past 240 hour(s))  URINE CULTURE     Status:  None   Collection Time    12/12/13  9:37 AM      Result Value Ref Range Status   Specimen Description URINE, CATHETERIZED   Final   Special Requests NONE   Final   Culture  Setup Time     Final   Value: 12/12/2013 18:58     Performed at [facility]     Final   Value: NO GROWTH     Performed at [facility]   Culture     Final   Value: NO GROWTH     Performed at [facility]   Report Status 12/13/2013 FINAL   Final  CULTURE, BLOOD (ROUTINE X 2)     Status: None   Collection Time    12/12/13 10:05 AM      Result Value Ref Range Status   Specimen Description BLOOD RIGHT ANTECUBITAL   Final   Special Requests BOTTLES DRAWN AEROBIC AND ANAEROBIC 5CC   Final   Culture  Setup Time     Final   Value: 12/12/2013 14:31     Performed at [facility]   Culture     Final   Value:        BLOOD CULTURE RECEIVED NO GROWTH TO DATE CULTURE WILL BE HELD FOR 5 DAYS BEFORE ISSUING A FINAL NEGATIVE REPORT     Performed at [facility]   Report Status PENDING   Incomplete  CULTURE, BLOOD (ROUTINE X 2)     Status: None   Collection Time    12/12/13 10:15 AM      Result Value Ref Range Status   Specimen Description BLOOD RIGHT WRIST   Final   Special Requests BOTTLES DRAWN AEROBIC AND ANAEROBIC 5CC   Final   Culture  Setup Time     Final   Value: 12/12/2013 14:31     Performed at [facility]   Culture     Final   Value:        BLOOD CULTURE RECEIVED NO GROWTH TO DATE CULTURE WILL BE HELD FOR 5 DAYS BEFORE ISSUING A FINAL NEGATIVE REPORT     Performed at [facility]   Report Status PENDING   Incomplete  CLOSTRIDIUM DIFFICILE BY PCR     Status: None   Collection Time    12/12/13  3:38 PM      Result Value Ref Range Status   C difficile by pcr NEGATIVE  NEGATIVE Final     History of present illness and  Hospital Course:     Kindly see H&P for history of present illness and admission details, please review complete Labs,  Consult reports and Test reports for all details in brief Diane [name], is a 79 y.o. female, patient with history of  end stage renal disease, hypertension and CAD. Patient came in to the hospital because of fever and chills.   Her  workup was consistent of SIRs due to influenza A infection, much improved with Tamiflu and supportive care. Will need to more dose of Tamiflu to finish  course. Clinically much improved     She also had mild diarrhea secondary to viral gastroenteritis, she was C. difficile negative, diarrhea almost completely resolved. Can use Imodium if needed.     ESRD requiring chronic dialysis continue dialysis regimen per renal team, she chronically runs low blood pressures and is asymptomatic from the standpoint. Is being dialyzed today.     Physical deconditioning and weakness. Continue PTOT at rehabilitation     History of CAD. Compensated from the standpoint no acute issues.    Chronic leg and feet pain. Continue Neurontin     Today   Subjective:   Diane [name] today has no headache,no chest abdominal pain,no new weakness tingling or numbness, feels much better.  Objective:   Blood pressure 94/51, pulse 88, temperature 98.2 F (36.8 C), temperature source Oral, resp. rate 18, height 5\' 2"  (1.575 m), weight 51.1 kg (112 lb 10.5 oz), SpO2 96.00%.   Intake/Output Summary (Last 24 hours) at 12/16/13 0841 Last data filed at 12/16/13 0646  Gross per 24 hour  Intake    600 ml  Output      0 ml  Net    600 ml    Exam Awake Alert, Oriented *3, No new F.N deficits, Normal affect [place].AT,PERRAL Supple Neck,No JVD, No cervical lymphadenopathy appriciated.  Symmetrical Chest wall movement, Good air movement bilaterally, CTAB RRR,No Gallops,Rubs or new Murmurs, No Parasternal Heave +ve B.Sounds, Abd Soft, Non tender, No organomegaly appriciated, No rebound -guarding or rigidity. No Cyanosis, Clubbing or edema, No new Rash or bruise  Data  Review   CBC w Diff: Lab Results  Component Value Date   WBC 6.2 12/16/2013   HGB 11.8* 12/16/2013   HCT 33.7* 12/16/2013   PLT 104* 12/16/2013   LYMPHOPCT 10* 12/12/2013   MONOPCT 6 12/12/2013   EOSPCT 1 12/12/2013   BASOPCT 0 12/12/2013    CMP: Lab Results  Component Value Date   NA 140 12/16/2013   K 3.5* 12/16/2013   CL 98 12/16/2013   CO2 21 12/16/2013   BUN 43* 12/16/2013   CREATININE 5.52* 12/16/2013   CREATININE 4.92* 08/23/2013   PROT 7.0 08/23/2013   ALBUMIN 3.6 12/16/2013   BILITOT 0.4 08/23/2013   ALKPHOS 53 08/23/2013   AST 19 08/23/2013   ALT 13 08/23/2013  .   Total Time in preparing paper work, data evaluation and todays exam - 35 minutes  Thurnell Lose M.D on 12/16/2013 at 8:41 AM  [facility] Office  [phone]

## 2013-12-16 NOTE — Progress Notes (Signed)
Subjective:   Tired and cold but feeling well. Going home today  Objective Filed Vitals:   12/16/13 0900 12/16/13 0930 12/16/13 1000 12/16/13 1030  BP: 95/56 92/53 90/49  93/46  Pulse: 87 89 89 84  Temp:      TempSrc:      Resp:      Height:      Weight:      SpO2:       Physical Exam General: alert and oriented. No acute distress. Resting on HD Heart: RRR. No murmur Lungs: CTA, unlabored Abdomen: soft nontender +BS Extremities: no LE edema Dialysis Access: LUA AVG on HD   Dialysis: TTS [facility]  3.5h 53.5kg 2K/2.25 [place] Heparin 5000 L AVG  Hectorol 1 Epo none Venofer none   Assessment/Plan:  1. Influenza- afebrile. WBC 6.2 Blood culture NGTD. Tamiflu and symptomatic tx  2. ESRD - TTS @ [facility]. HD today and resume outpt if DCd. Under EDW, lower at DC  3. Anemia - hgb 11.8, ESA started.  4. Secondary hyperparathyroidism - Ca+ 9.4. Phos 4.8. Cont phoslo and hectorol.  5. HTN/volume - 106/47. Home metop on hold  6. Nutrition - appetite improving. Renal diet. multivit  7. Diarrhea- improving. Cdiff negative. On immodium 8. Dispo- DC to SNF for rehab  Shelle Iron, NP [place] [phone] 12/16/2013,10:45 AM  LOS: 4 days   I have seen and examined patient, discussed with PA and agree with assessment and plan as outlined above. Kelly Splinter MD pager [phone]    cell [phone] 12/16/2013, 12:08 PM   Additional Objective Labs: Basic Metabolic Panel:  Recent Labs Lab 12/14/13 0540 12/15/13 0500 12/16/13 0730  NA 147 140 140  K 5.5* 3.8 3.5*  CL 116* 98 98  CO2 16* 27 21  GLUCOSE 118* 101* 142*  BUN 58* 25* 43*  CREATININE 6.77* 4.44* 5.52*  CALCIUM 8.5 8.5 9.4  PHOS 4.6  --  4.8*   Liver Function Tests:  Recent Labs Lab 12/14/13 0540 12/16/13 0730  ALBUMIN 3.0* 3.6   No results found for this basename: LIPASE, AMYLASE,  in the last 168 hours CBC:  Recent Labs Lab 12/12/13 1015 12/13/13 0240 12/14/13 0818 12/16/13 0729   WBC 8.4 6.6 7.3 6.2  NEUTROABS 7.0  --   --   --   HGB 11.0* 9.8* 10.4* 11.8*  HCT 33.5* 29.6* 31.3* 33.7*  MCV 94.4 95.5 94.8 89.4  PLT 100* 81* 86* 104*   Blood Culture    Component Value Date/Time   SDES BLOOD RIGHT WRIST 12/12/2013 1015   SPECREQUEST BOTTLES DRAWN AEROBIC AND ANAEROBIC 5CC 12/12/2013 1015   CULT  Value:        BLOOD CULTURE RECEIVED NO GROWTH TO DATE CULTURE WILL BE HELD FOR 5 DAYS BEFORE ISSUING A FINAL NEGATIVE REPORT Performed at [facility] 12/12/2013 1015   REPTSTATUS PENDING 12/12/2013 1015    Cardiac Enzymes: No results found for this basename: CKTOTAL, CKMB, CKMBINDEX, TROPONINI,  in the last 168 hours CBG: No results found for this basename: GLUCAP,  in the last 168 hours Iron Studies: No results found for this basename: IRON, TIBC, TRANSFERRIN, FERRITIN,  in the last 72 hours @lablastinr3 @ Studies/Results: No results found. Medications:   . aspirin  81 mg Oral Daily  . calcium acetate  667 mg Oral Q breakfast  . clorazepate  3.75 mg Oral QHS  . heparin  5,000 Units Subcutaneous 3 times per day  . heparin  5,000 Units Dialysis  Once in dialysis  . mometasone   Topical Daily  . multivitamin  1 tablet Oral QHS  . oseltamivir  30 mg Oral Q T,Th,Sat-1800  . sodium chloride  3 mL Intravenous Q12H

## 2013-12-16 NOTE — Discharge Instructions (Signed)
Follow with Primary MD Donnajean Lopes, MD in 7 days   Get CBC, CMP, checked 7 days by Primary MD and again as instructed by your Primary MD. Get a 2 view Chest X ray done next visit.   Activity: As tolerated with Full fall precautions use walker/cane & assistance as needed   Disposition SNF   Diet: Heart Healthy - Renal    Check your Weight same time everyday, if you gain over 2 pounds, or you develop in leg swelling, experience more shortness of breath or chest pain, call your Primary MD immediately. Follow Cardiac Low Salt Diet and 1.5 lit/day fluid restriction.   On your next visit with her primary care physician please Get Medicines reviewed and adjusted.  Please request your Prim.MD to go over all Hospital Tests and Procedure/Radiological results at the follow up, please get all Hospital records sent to your Prim MD by signing hospital release before you go home.   If you experience worsening of your admission symptoms, develop shortness of breath, life threatening emergency, suicidal or homicidal thoughts you must seek medical attention immediately by calling 911 or calling your MD immediately  if symptoms less severe.  You Must read complete instructions/literature along with all the possible adverse reactions/side effects for all the Medicines you take and that have been prescribed to you. Take any new Medicines after you have completely understood and accpet all the possible adverse reactions/side effects.   Do not drive and provide baby sitting services if your were admitted for syncope or siezures until you have seen by Primary MD or a Neurologist and advised to do so again.  Do not drive when taking Pain medications.    Do not take more than prescribed Pain, Sleep and Anxiety Medications  Special Instructions: If you have smoked or chewed Tobacco  in the last 2 yrs please stop smoking, stop any regular Alcohol  and or any Recreational drug use.  Wear Seat belts while  driving.   Please note  You were cared for by a hospitalist during your hospital stay. If you have any questions about your discharge medications or the care you received while you were in the hospital after you are discharged, you can call the unit and asked to speak with the hospitalist on call if the hospitalist that took care of you is not available. Once you are discharged, your primary care physician will handle any further medical issues. Please note that NO REFILLS for any discharge medications will be authorized once you are discharged, as it is imperative that you return to your primary care physician (or establish a relationship with a primary care physician if you do not have one) for your aftercare needs so that they can reassess your need for medications and monitor your lab values.

## 2013-12-17 ENCOUNTER — Other Ambulatory Visit: Payer: Self-pay | Admitting: *Deleted

## 2013-12-17 MED ORDER — CLORAZEPATE DIPOTASSIUM 3.75 MG PO TABS: 3.7500 mg | ORAL_TABLET | Freq: Every day | ORAL | Status: DC

## 2013-12-17 NOTE — Telephone Encounter (Signed)
[facility] 

## 2013-12-18 LAB — CULTURE, BLOOD (ROUTINE X 2)
Culture: NO GROWTH
Culture: NO GROWTH

## 2013-12-20 ENCOUNTER — Non-Acute Institutional Stay (SKILLED_NURSING_FACILITY): Payer: Medicare Other | Admitting: Internal Medicine

## 2013-12-20 DIAGNOSIS — J09X2 Influenza due to identified novel influenza A virus with other respiratory manifestations: Secondary | ICD-10-CM

## 2013-12-20 DIAGNOSIS — G609 Hereditary and idiopathic neuropathy, unspecified: Secondary | ICD-10-CM

## 2013-12-20 DIAGNOSIS — N185 Chronic kidney disease, stage 5: Secondary | ICD-10-CM

## 2013-12-20 NOTE — Progress Notes (Signed)
Patient ID: Diane [name], female   DOB: June 17, 1935, 79 y.o.     Facility; [facility] Chief complaint; admission to SNF post admit to [facility] from 2/22 to 2/26  History;  this is a 79 year old woman who lives independently on her own. She is end-stage renal disease on dialysis for the past 2 years. She tells me she became ill roughly 2 days before admission with the nasal drainage hacking cough fever and then diarrhea. She was admitted to hospital of almost collapsing at home per in the hospital she was discovered to be influenza 8 positive for. She was treated with Tamiflu and initially with empiric broad-spectrum antibiotics for SIRS. Antibiotics were stopped when cultures were negative. She seems to a made a good recovery and is here in this facility with her husband at rehabilitation for rib location before returning home.  Past Medical History  Diagnosis Date  . Anemia in chronic kidney disease(285.21)   . Bipolar disorder, unspecified   . Iron deficiency anemia, unspecified   . Secondary hyperparathyroidism (of renal origin)   . Restless legs syndrome (RLS)   . Unspecified hypertensive kidney disease with chronic kidney disease stage V or end stage renal disease   . 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 Personal history of colonic polyps   . Diverticulosis of colon (without mention of hemorrhage) 2006    Colonoscopy Dr. Oletta Lamas  . Coronary artery disease   . Anginal pain   . S/P CABG x 5, 08/15/12, LIMA-LAD;VG-diag;VG-OM; seq VG-PDA,PLA  08/18/2012   Past Surgical History  Procedure Laterality Date  . Arteriovenous graft placement  11/2010    left upper - AVGG Dr. Donnetta Hutching  . Arteriovenous graft placement  12/2009    left lower - [facility] Dr. Donnetta Hutching  . Av fistula placement  07/2009    right upper AVF Dr. Donnetta Hutching  . Av fistula placement  01/2002    right lower Dr. Donnetta Hutching  . Cardiac catheterization  08/12/2012  .  Abdominal hysterectomy    . Coronary artery bypass graft  08/14/2012    Procedure: CORONARY ARTERY BYPASS GRAFTING (CABG);  Surgeon: Gaye Pollack, MD;  Location: [facility];  Service: Open Heart Surgery;  Laterality: N/A;  times five, using left internal mammary  . Endovein harvest of greater saphenous vein  08/14/2012    Procedure: ENDOVEIN HARVEST OF GREATER SAPHENOUS VEIN;  Surgeon: Gaye Pollack, MD;  Location: [place];  Service: Open Heart Surgery;  Laterality: Bilateral;    Current Outpatient Prescriptions on File Prior to Visit  Medication Sig Dispense Refill  . acetaminophen (TYLENOL) 500 MG tablet Take 1,000 mg by mouth every 6 (six) hours as needed. For pain      . aspirin 81 MG tablet Take 81 mg by mouth daily.      Marland Kitchen b complex-vitamin c-folic acid (NEPHRO-VITE) 0.8 MG TABS Take 0.8 mg by mouth daily.      . calcium acetate (PHOSLO) 667 MG capsule Take 1 capsule by mouth daily.      . clorazepate (TRANXENE) 3.75 MG tablet Take 1 tablet (3.75 mg total) by mouth at bedtime. For anxiety  30 tablet  5  . gabapentin (NEURONTIN) 100 MG capsule Take 200 mg by mouth 2 (two) times daily.       . hydrOXYzine (ATARAX/VISTARIL) 50 MG tablet Take 50 mg by mouth at bedtime.       Marland Kitchen Ketotifen Fumarate (  ALAWAY OP) Place 1 drop into both eyes 2 (two) times daily as needed. For allergies      . metoprolol tartrate (LOPRESSOR) 25 MG tablet Take 12.5 mg by mouth 2 (two) times daily.      . mometasone (ELOCON) 0.1 % cream Apply 1 application topically as needed (for itching from tape).       Marland Kitchen NITROSTAT 0.4 MG SL tablet Place 0.4 mg under the tongue every 5 (five) minutes as needed.       . Nutritional Supplements (FEEDING SUPPLEMENT, NEPRO CARB STEADY,) LIQD Take 237 mLs by mouth as needed (missed meal during dialysis.).    0  . olopatadine (PATANOL) 0.1 % ophthalmic solution Place 1 drop into both eyes 2 (two) times daily as needed (for allergies).  5 mL    . oseltamivir (TAMIFLU) 30 MG capsule Take 1  capsule (30 mg total) by mouth every Tuesday, Thursday, and Saturday at 6 PM. 2 more doses      . polyethylene glycol (MIRALAX / GLYCOLAX) packet Take 17 g by mouth daily as needed. For constipation      . sevelamer (RENAGEL) 800 MG tablet Take 800 mg by mouth 2 (two) times daily.       No current facility-administered medications on file prior to visit.    Social Patient lives in her own home with her husband. She is functional and independent.  reports that she has never smoked. She has never used smokeless tobacco. She reports that she does not drink alcohol or use illicit drugs.  reported the following about her mother: Died from CVA. She reported the following about her father: Died from [place].   Review of systems Respiratory no shortness of breath Cardiac has a history of coronary artery disease with an MI however she seems stable GI states and diarrhea and nausea have abated Extremities; states she has a terrible burning pain in her feet much greater than her hands which seems to occur after each dialysis she has been prescribed Neurontin for the GU states she still voids quite frequently  Physical examination Gen. well looking woman in no distress Respiratory clear entry bilaterally Cardiac heart sounds are normal no murmurs Abdomen liver edge is palpable no spleen Extremities; peripheral pulses are intact the. Both her knee and ankle jerks are normal bilaterally somewhat unusual for her peripheral neuropathy Neurologic; patient is able to bring herself to a standing position. Her gait is stable  Impression/plan #1 influenza A she was successfully treated in the hospital she is feeling much better #2 chronic renal failure on dialysis for the last 2 years apparently initially related to chronic lithium toxicity #3 peripheral neuropathy; the patient is not a diabetic. The association of her dysesthesia with dialysis is really unclear to me. She has been put on Neurontin and that has  not helped according to her #4 coronary artery disease; at least at the bedside there is no evidence of this being active

## 2013-12-22 ENCOUNTER — Non-Acute Institutional Stay (SKILLED_NURSING_FACILITY): Payer: Medicare Other | Admitting: Internal Medicine

## 2013-12-22 DIAGNOSIS — G609 Hereditary and idiopathic neuropathy, unspecified: Secondary | ICD-10-CM

## 2013-12-22 DIAGNOSIS — G629 Polyneuropathy, unspecified: Secondary | ICD-10-CM

## 2013-12-25 ENCOUNTER — Encounter: Payer: Self-pay | Admitting: Internal Medicine

## 2013-12-25 NOTE — Progress Notes (Signed)
Patient ID: Diane [name], female   DOB: 04/19/1935, 79 y.o.   MRN: [id number]   This is an acute visit.  Level of care skilled.  [place].  Chief complaint-acute visit secondary to leg pain peripheral neuropathy.  History of present illness.  Patient is a very pleasant 79 year old female here for short-term rehabilitation after hospitalization for apparent influenza-this is complicated with a history of dialysis with chronic renal failure apparently due to chronic lithium toxicity.  She appears to have a history of peripheral neuropathy she is on Neurontin but says this is not giving her adequate relief-she says she was on Norco in the hospital and that did help.  Currently she is on Tylenol 650 mg every 6 hours when necessary.  Is not a diabetic but she feels that ever since she's been on dialysis neuropathy has been more of a problem.  Family medical social history as been reviewed per admission note on 12/20/2013.  Medications have been reviewed per MAR.  Review of systems.  General denies any fever or chills.  Respiratory no complaints of shortness of breath or cough.  Cardiac no chest pain.  Muscle skeletal-complains of gripping stinging pain in her lower extremities bilaterally  Neurologic as stated above appears to complain of neuropathic pain in her lower extremities.  Physical exam.  Temperature 98.6 pulse 83 respirations 20 blood pressure 98/54  In general this is a very pleasant elderly female in no distress sitting comfortably on the side of her bed  Her skin is warm and dry.  Chest is clear to auscultation no labored breathing.  Heart is regular rate and rhythm without murmur gallop or rub.  Muscle skeletal-moves all extremities x4 is able to ambulate--I do not note any deformities other than arthritic of her knees or lower extremities.  She does have positive capillary refill of her toe.  Neurologic-does have positive touch sensation  of her lower extremities and feet--is able to move them with appropriate strength and range of motion-area  Assessment and plan.  #1-lower extremity pain suspect neuropathy-she is on Neurontin this is complicated with her history of end-stage renal disease which I suspect limits her Neurontin dose  She states she did receive relief receiving hydrocodone in the hospital we'll restart this 5/325 mg every 6 hours when necessary--dialysis will have to be notified of this.  She had been receiving Tylenol as well alone but apparently this was not very effective.  [id number].

## 2014-01-03 ENCOUNTER — Non-Acute Institutional Stay (SKILLED_NURSING_FACILITY): Payer: Medicare Other | Admitting: Internal Medicine

## 2014-01-03 DIAGNOSIS — I2510 Atherosclerotic heart disease of native coronary artery without angina pectoris: Secondary | ICD-10-CM

## 2014-01-03 DIAGNOSIS — Z992 Dependence on renal dialysis: Secondary | ICD-10-CM

## 2014-01-03 DIAGNOSIS — J101 Influenza due to other identified influenza virus with other respiratory manifestations: Secondary | ICD-10-CM

## 2014-01-03 DIAGNOSIS — N186 End stage renal disease: Secondary | ICD-10-CM

## 2014-01-03 DIAGNOSIS — G629 Polyneuropathy, unspecified: Secondary | ICD-10-CM

## 2014-01-03 DIAGNOSIS — J111 Influenza due to unidentified influenza virus with other respiratory manifestations: Secondary | ICD-10-CM

## 2014-01-03 DIAGNOSIS — R5381 Other malaise: Secondary | ICD-10-CM

## 2014-01-03 DIAGNOSIS — G609 Hereditary and idiopathic neuropathy, unspecified: Secondary | ICD-10-CM

## 2014-01-03 NOTE — Progress Notes (Signed)
Patient ID: Diane [name], female   DOB: [birth year], 79 y.o.   MRN: [id number]   Facility; [facility] Chief complaint;discharge note   HPI--;  this is a 79 year old woman who lives independently on her own. She is end-stage renal disease on dialysis for the past 2 years. she became ill roughly 2 days before admission  To hospital with the nasal drainage hacking cough fever and then diarrhea. She was admitted to hospital of almost collapsing at home per in the hospital she was discovered to be influenza A positive for. She was treated with Tamiflu and initially with empiric broad-spectrum antibiotics for SIRS. Antibiotics were stopped when cultures were negative. She seems to a made a good recovery and iwas here in this facility with her husbandn for rehab  before returning home--she will be going home with her daughter-will need some more therapy secondary to weakness after her recent hospitalization but she appears to be doing well this is complicated  with her history of end-stage renal disease on dialysis.--In what appears to be some neuropathy    Past Medical History   Diagnosis  Date   .  Anemia in chronic kidney disease(285.21)     .  Bipolar disorder, unspecified     .  Iron deficiency anemia, unspecified     .  Secondary hyperparathyroidism (of renal origin)     .  Restless legs syndrome (RLS)     .  Unspecified hypertensive kidney disease with chronic kidney disease stage V or end stage renal disease     .  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  Personal history of colonic polyps     .  Diverticulosis of colon (without mention of hemorrhage)  2006       Colonoscopy Dr. Oletta Lamas   .  Coronary artery disease     .  Anginal pain     .  S/P CABG x 5, 08/15/12, LIMA-LAD;VG-diag;VG-OM; seq VG-PDA,PLA   08/18/2012       Past Surgical History   Procedure  Laterality  Date   .  Arteriovenous graft placement    11/2010        left upper - [facility] Dr. Donnetta Hutching   .  Arteriovenous graft placement    12/2009       left lower - [facility] Dr. Donnetta Hutching   .  Av fistula placement    07/2009       right upper AVF Dr. Donnetta Hutching   .  Av fistula placement    01/2002       right lower Dr. Donnetta Hutching   .  Cardiac catheterization    08/12/2012   .  Abdominal hysterectomy       .  Coronary artery bypass graft    08/14/2012       Procedure: CORONARY ARTERY BYPASS GRAFTING (CABG);  Surgeon: Gaye Pollack, MD;  Location: [place];  Service: Open Heart Surgery;  Laterality: N/A;  times five, using left internal mammary   .  Endovein harvest of greater saphenous vein    08/14/2012       Procedure: ENDOVEIN HARVEST OF GREATER SAPHENOUS VEIN;  Surgeon: Gaye Pollack, MD;  Location: [place];  Service: Open Heart Surgery;  Laterality: Bilateral;         Current Outpatient Prescriptions on File Prior to Visit   Medication  Sig  Dispense  Refill   .  acetaminophen (TYLENOL)325 mg  Take two tabs---650 mg  by mouth every 6 (six) hours as needed. For pain         .  aspirin 81 MG tablet  Take 81 mg by mouth daily.         Marland Kitchen  b complex-vitamin c-folic acid (NEPHRO-VITE) 0.8 MG TABS  Take 0.8 mg by mouth daily.         .  calcium acetate (PHOSLO) 667 MG capsule  Take 1 capsule by mouth daily.         .  clorazepate (TRANXENE) 3.75 MG tablet  Take 1 tablet (3.75 mg total) by mouth at bedtime. For anxiety   30 tablet   5   .  gabapentin (NEURONTIN) 100 MG capsule  Take 200 mg by mouth 2 (two) times daily.          .  hydrOXYzine (ATARAX/VISTARIL) 50 MG tablet  Take 50 mg by mouth at bedtime.          Marland Kitchen  Ketotifen Fumarate (ALAWAY OP)  Place 1 drop into both eyes 2 (two) times daily as needed. For allergies         .  metoprolol tartrate (LOPRESSOR) 25 MG tablet  Take 12.5 mg by mouth 2 (two) times daily.         .  mometasone (ELOCON) 0.1 % cream  Apply 1 application topically as needed (for itching from tape).          Marland Kitchen  NITROSTAT 0.4 MG SL tablet  Place 0.4  mg under the tongue every 5 (five) minutes as needed.          .  Nutritional Supplements (FEEDING SUPPLEMENT, NEPRO CARB STEADY,) LIQD  Take 237 mLs by mouth as needed (missed meal during dialysis.).      0   .  olopatadine (PATANOL) 0.1 % ophthalmic solution  Place 1 drop into both eyes 2 (two) times daily as needed (for allergies).   5 mL               .  polyethylene glycol (MIRALAX / GLYCOLAX) packet  Take 17 g by mouth daily as needed. For constipation  Norco 05-325 milligrams 1 tab every 6 hours when necessary pain         .            Marland Kitchen        Social Patient lives in her own home with her husband--her daughter will be stayin gwith her for the time being after discharge. She is functional and independent.  reports that she has never smoked. She has never used smokeless tobacco. She reports that she does not drink alcohol or use illicit drugs.   reported the following about her mother: Died from CVA. She reported the following about her father: Died from [place].    Review of systems Gen. no complaints of fever chills.  Head ears eyes nose mouth and throat-does not complaining of visual changes or sore throat Respiratory no shortness of breath Cardiac has a history of coronary artery disease with an MI however she seems stable GI states and diarrhea and nausea have abated--does not complaining of abdominal pain Extremities; states she has a  burning pain in her feet much greater than her hands which seems to occur after each dialysis she has been prescribed Neurontin for this  and recently added when necessary Norco--she says this is helping some GU-does not  complaining of dysuria.  Neurologic-does complain of neuropathic type pain as noted above is not complaining of headache or dizziness.  Psych some history of anxiety      Physical examination Temperature 98.2 pulse 94 respirations 18 blood pressure 101/65 of these appear to be relatively baseline Gen. well looking  woman in no distress Eyes-pupils appear equal round reactive to light sclera and conjunctiva are clear visual acuity appears grossly intact.--- She has prescription lenses  Oropharynx clear mucous membranes moist.   Respiratory clear entry bilaterally--no labored breathing Cardiac heart sounds are normal no murmurs regular rate and rhythm Abdomen soft nontender with positive bowel sounds Extremities; moves all extremities x4 I do not note any deformities   Neurologic; patient is able to bring herself to a standing position. Her gait is stable although continues to have some weakness with neuropathic pain  Labs.  12/27/2013.  CBC 8.5 hemoglobin 9.8 platelets 281.  Sodium 141 potassium 4.5 BUN 41 creatinine 4.45.  Liver function tests within normal limits.     Impression/plan #1 influenza A she was successfully treated in the hospital she is feeling much better #2 chronic renal failure on dialysis for the last 2 years apparently initially related to chronic lithium toxicity #3 peripheral neuropathy; the patient is not a diabetic--this continues to be a challenging situation she is on Neurontin as well as Norco-will need followup by primary care provider apparently the [place] is giving some relief.    #4 coronary artery disease; at least at the bedside there is no evidence of this being active--she is on aspirin--she also continues on a beta blocker. As well as nitroglycerin when necessary although apparently she does not really need this much at all.  #5-anxiety this appears to be stable she is on clorazepate at night.   At this  point patient appears to be quite stable she will need continued PT and OT for strengthening as well as nursing support for her medical issues including significant neuropathy with a history of renal disease on dialysis.  [id number] note greater than 30 minutes spent on this discharge summary-more than 50% of time spent coordinating plan of  care-scripts have been written

## 2014-01-04 ENCOUNTER — Encounter: Payer: Self-pay | Admitting: Internal Medicine

## 2014-02-19 ENCOUNTER — Telehealth: Payer: Self-pay

## 2014-02-19 ENCOUNTER — Ambulatory Visit (INDEPENDENT_AMBULATORY_CARE_PROVIDER_SITE_OTHER): Payer: Medicare Other | Admitting: Internal Medicine

## 2014-02-19 VITALS — BP 104/56 | HR 98 | Temp 98.4°F | Resp 18 | Ht 60.5 in | Wt 111.8 lb

## 2014-02-19 DIAGNOSIS — Z992 Dependence on renal dialysis: Secondary | ICD-10-CM

## 2014-02-19 DIAGNOSIS — IMO0001 Reserved for inherently not codable concepts without codable children: Secondary | ICD-10-CM

## 2014-02-19 DIAGNOSIS — R300 Dysuria: Secondary | ICD-10-CM

## 2014-02-19 DIAGNOSIS — R350 Frequency of micturition: Secondary | ICD-10-CM

## 2014-02-19 DIAGNOSIS — N186 End stage renal disease: Secondary | ICD-10-CM

## 2014-02-19 DIAGNOSIS — N390 Urinary tract infection, site not specified: Secondary | ICD-10-CM

## 2014-02-19 LAB — POCT URINALYSIS DIPSTICK
Bilirubin, UA: NEGATIVE
Glucose, UA: NEGATIVE
KETONES UA: NEGATIVE
NITRITE UA: NEGATIVE
PH UA: 8.5
Protein, UA: 100
Spec Grav, UA: 1.015
Urobilinogen, UA: 0.2

## 2014-02-19 LAB — POCT UA - MICROSCOPIC ONLY
Casts, Ur, LPF, POC: NEGATIVE
Crystals, Ur, HPF, POC: NEGATIVE
MUCUS UA: POSITIVE
YEAST UA: NEGATIVE

## 2014-02-19 MED ORDER — CIPROFLOXACIN HCL 250 MG PO TABS: 250.0000 mg | ORAL_TABLET | Freq: Two times a day (BID) | ORAL | Status: DC

## 2014-02-19 NOTE — Progress Notes (Signed)
Subjective:    Patient ID: Diane [name], female    DOB: May 15, 1935, 79 y.o.   MRN: [id number]  HPI1st [facility] OV  complaining of dysuria frequency and urgency No fever abdominal pain back pain nausea or vomiting Last uti 10/14 Complaining of this at dialysis on Friday and was cultured though not treated Patient Active Problem List   Diagnosis Date Noted  . CKD (chronic kidney disease) stage V requiring chronic dialysis 12/13/2013  . Physical deconditioning 12/13/2013  . Sepsis 12/12/2013  . Influenza A 12/12/2013  . Peripheral neuropathy 08/11/2013  . Cardiovascular stress test abnormal and chest pain with tachycardia 08/20/2012  . Anemia,chronic and acute post op 08/20/2012  . S/P CABG x 5, 08/15/12, LIMA-LAD;VG-diag;VG-OM; seq VG-PDA,PLA  08/18/2012  . CAD (coronary artery disease): Severe three vessel 08/13/2012  . ESRD (end stage renal disease) on dialysis 08/13/2012  . Knee pain, acute, right 08/13/2012   Current outpatient prescriptions:aspirin 81 MG tablet, Take 81 mg by mouth daily., Disp: , Rfl: ;  b complex-vitamin c-folic acid (NEPHRO-VITE) 0.8 MG TABS, Take 0.8 mg by mouth daily., Disp: , Rfl: ;  calcium acetate (PHOSLO) 667 MG capsule, Take 667 mg by mouth 3 (three) times daily with meals. , Disp: , Rfl: ;  clorazepate (TRANXENE) 3.75 MG tablet, Take 1 tablet (3.75 mg total) by mouth at bedtime. For anxiety, Disp: 30 tablet, Rfl: 5 gabapentin (NEURONTIN) 100 MG capsule, Take 200 mg by mouth 2 (two) times daily. , Disp: , Rfl: ;  HYDROcodone-acetaminophen (NORCO/VICODIN) 5-325 MG per tablet, Take 1 tablet by mouth every 6 (six) hours as needed for moderate pain., Disp: , Rfl: ;  hydrOXYzine (ATARAX/VISTARIL) 50 MG tablet, Take 50 mg by mouth at bedtime. , Disp: , Rfl:  Ketotifen Fumarate (ALAWAY OP), Place 1 drop into both eyes 2 (two) times daily as needed. For allergies, Disp: , Rfl: ;  metoprolol tartrate (LOPRESSOR) 25 MG tablet, Take 12.5 mg by mouth 2 (two) times  daily., Disp: , Rfl: ;  mometasone (ELOCON) 0.1 % cream, Apply 1 application topically as needed (for itching from tape). , Disp: , Rfl: ;  NITROSTAT 0.4 MG SL tablet, Place 0.4 mg under the tongue every 5 (five) minutes as needed. , Disp: , Rfl:  Nutritional Supplements (FEEDING SUPPLEMENT, NEPRO CARB STEADY,) LIQD, Take 237 mLs by mouth as needed (missed meal during dialysis.)., Disp: , Rfl: 0;  olopatadine (PATANOL) 0.1 % ophthalmic solution, Place 1 drop into both eyes 2 (two) times daily as needed (for allergies)., Disp: 5 mL, Rfl: ;  polyethylene glycol (MIRALAX / GLYCOLAX) packet, Take 17 g by mouth daily as needed. For constipation, Disp: , Rfl:   Review of Systems Noncontributory     Objective:   Physical Exam BP 104/56  Pulse 98  Temp(Src) 98.4 F (36.9 C) (Oral)  Resp 18  Ht 5' 0.5" (1.537 m)  Wt 111 lb 12.8 oz (50.712 kg)  BMI 21.47 kg/m2  SpO2 100% No acute distress No abdominal or flank tenderness  Results for orders placed in visit on 02/19/14  POCT UA - MICROSCOPIC ONLY      Result Value Ref Range   WBC, Ur, HPF, POC 5-12     RBC, urine, microscopic 4-7     Bacteria, U Microscopic 1+     Mucus, UA pos     Epithelial cells, urine per micros 0-1     Crystals, Ur, HPF, POC neg     Casts, Ur, LPF, POC neg  Yeast, UA neg    POCT URINALYSIS DIPSTICK      Result Value Ref Range   Color, UA yellow     Clarity, UA clear     Glucose, UA neg     Bilirubin, UA neg     Ketones, UA neg     Spec Grav, UA 1.015     Blood, UA trace     pH, UA 8.5     Protein, UA 100     Urobilinogen, UA 0.2     Nitrite, UA neg     Leukocytes, UA Trace            Assessment & Plan:  Dysuria - Plan: POCT UA - Microscopic Only, POCT urinalysis dipstick, Urine culture  Frequency - Plan: POCT UA - Microscopic Only, POCT urinalysis dipstick, Urine culture  Urinary tract infection, site not specified  ESRD (end stage renal disease) on dialysis  Meds ordered this encounter    Medications  . ciprofloxacin (CIPRO) 250 MG tablet    Sig: Take 1 tablet (250 mg total) by mouth 2 (two) times daily.    Dispense:  20 tablet    Refill:  0   Notify culture results

## 2014-02-21 LAB — URINE CULTURE: Colony Count: >=100000

## 2014-03-09 ENCOUNTER — Emergency Department (HOSPITAL_COMMUNITY)
Admission: EM | Admit: 2014-03-09 | Discharge: 2014-03-10 | Disposition: A | Discharge status: Home / Self Care | Payer: Medicare Other | Attending: Emergency Medicine | Admitting: Emergency Medicine

## 2014-03-09 ENCOUNTER — Encounter (HOSPITAL_COMMUNITY): Payer: Self-pay | Admitting: Emergency Medicine

## 2014-03-09 ENCOUNTER — Encounter: Payer: Self-pay | Admitting: Internal Medicine

## 2014-03-09 DIAGNOSIS — F419 Anxiety disorder, unspecified: Secondary | ICD-10-CM

## 2014-03-09 DIAGNOSIS — I120 Hypertensive chronic kidney disease with stage 5 chronic kidney disease or end stage renal disease: Secondary | ICD-10-CM | POA: Insufficient documentation

## 2014-03-09 DIAGNOSIS — Z862 Personal history of diseases of the blood and blood-forming organs and certain disorders involving the immune mechanism: Secondary | ICD-10-CM | POA: Insufficient documentation

## 2014-03-09 DIAGNOSIS — Z7982 Long term (current) use of aspirin: Secondary | ICD-10-CM | POA: Insufficient documentation

## 2014-03-09 DIAGNOSIS — Z91040 Latex allergy status: Secondary | ICD-10-CM | POA: Insufficient documentation

## 2014-03-09 DIAGNOSIS — Z792 Long term (current) use of antibiotics: Secondary | ICD-10-CM | POA: Insufficient documentation

## 2014-03-09 DIAGNOSIS — F430 Acute stress reaction: Secondary | ICD-10-CM | POA: Insufficient documentation

## 2014-03-09 DIAGNOSIS — I209 Angina pectoris, unspecified: Secondary | ICD-10-CM | POA: Insufficient documentation

## 2014-03-09 DIAGNOSIS — Z951 Presence of aortocoronary bypass graft: Secondary | ICD-10-CM | POA: Insufficient documentation

## 2014-03-09 DIAGNOSIS — Z79899 Other long term (current) drug therapy: Secondary | ICD-10-CM | POA: Insufficient documentation

## 2014-03-09 DIAGNOSIS — G2581 Restless legs syndrome: Secondary | ICD-10-CM | POA: Insufficient documentation

## 2014-03-09 DIAGNOSIS — F411 Generalized anxiety disorder: Secondary | ICD-10-CM | POA: Insufficient documentation

## 2014-03-09 DIAGNOSIS — Z8719 Personal history of other diseases of the digestive system: Secondary | ICD-10-CM | POA: Insufficient documentation

## 2014-03-09 DIAGNOSIS — F439 Reaction to severe stress, unspecified: Secondary | ICD-10-CM

## 2014-03-09 DIAGNOSIS — Z9889 Other specified postprocedural states: Secondary | ICD-10-CM | POA: Insufficient documentation

## 2014-03-09 DIAGNOSIS — Z860100 Personal history of colon polyps, unspecified: Secondary | ICD-10-CM | POA: Insufficient documentation

## 2014-03-09 DIAGNOSIS — I2510 Atherosclerotic heart disease of native coronary artery without angina pectoris: Secondary | ICD-10-CM | POA: Insufficient documentation

## 2014-03-09 DIAGNOSIS — F319 Bipolar disorder, unspecified: Secondary | ICD-10-CM | POA: Insufficient documentation

## 2014-03-09 DIAGNOSIS — N186 End stage renal disease: Secondary | ICD-10-CM | POA: Insufficient documentation

## 2014-03-09 LAB — COMPREHENSIVE METABOLIC PANEL
ALK PHOS: 66 U/L (ref 39–117)
ALT: 21 U/L (ref 0–35)
AST: 26 U/L (ref 0–37)
Albumin: 4.3 g/dL (ref 3.5–5.2)
BUN: 40 mg/dL — ABNORMAL HIGH (ref 6–23)
CHLORIDE: 92 meq/L — AB (ref 96–112)
CO2: 27 meq/L (ref 19–32)
Calcium: 9.9 mg/dL (ref 8.4–10.5)
Creatinine, Ser: 4.53 mg/dL — ABNORMAL HIGH (ref 0.50–1.10)
GFR, EST AFRICAN AMERICAN: 10 mL/min — AB (ref >90)
GFR, EST NON AFRICAN AMERICAN: 9 mL/min — AB (ref >90)
GLUCOSE: 77 mg/dL (ref 70–99)
Potassium: 3.8 mEq/L (ref 3.7–5.3)
SODIUM: 139 meq/L (ref 137–147)
Total Bilirubin: 0.3 mg/dL (ref 0.3–1.2)
Total Protein: 8 g/dL (ref 6.0–8.3)

## 2014-03-09 LAB — CBC WITH DIFFERENTIAL/PLATELET
BASOS ABS: 0.1 10*3/uL (ref 0.0–0.1)
Basophils Relative: 1 % (ref 0–1)
Eosinophils Absolute: 0.7 10*3/uL (ref 0.0–0.7)
Eosinophils Relative: 6 % — ABNORMAL HIGH (ref 0–5)
HEMATOCRIT: 37.5 % (ref 36.0–46.0)
Hemoglobin: 12.4 g/dL (ref 12.0–15.0)
LYMPHS ABS: 3.1 10*3/uL (ref 0.7–4.0)
Lymphocytes Relative: 29 % (ref 12–46)
MCH: 30.4 pg (ref 26.0–34.0)
MCHC: 33.1 g/dL (ref 30.0–36.0)
MCV: 91.9 fL (ref 78.0–100.0)
Monocytes Absolute: 1.1 10*3/uL — ABNORMAL HIGH (ref 0.1–1.0)
Monocytes Relative: 10 % (ref 3–12)
NEUTROS ABS: 5.6 10*3/uL (ref 1.7–7.7)
Neutrophils Relative %: 54 % (ref 43–77)
PLATELETS: 211 10*3/uL (ref 150–400)
RBC: 4.08 MIL/uL (ref 3.87–5.11)
RDW: 13.5 % (ref 11.5–15.5)
WBC: 10.5 10*3/uL (ref 4.0–10.5)

## 2014-03-09 LAB — RAPID URINE DRUG SCREEN, HOSP PERFORMED
Amphetamines: NOT DETECTED
BARBITURATES: NOT DETECTED
Benzodiazepines: POSITIVE — AB
Cocaine: NOT DETECTED
Opiates: NOT DETECTED
Tetrahydrocannabinol: NOT DETECTED

## 2014-03-09 LAB — ETHANOL

## 2014-03-09 LAB — SALICYLATE LEVEL

## 2014-03-09 LAB — ACETAMINOPHEN LEVEL: Acetaminophen (Tylenol), Serum: <15 ug/mL (ref 10–30)

## 2014-03-09 MED ORDER — LORAZEPAM 1 MG PO TABS: 1.0000 mg | ORAL_TABLET | Freq: Three times a day (TID) | ORAL | Status: DC | PRN

## 2014-03-09 MED ORDER — ALPRAZOLAM 0.5 MG PO TABS
0.5000 mg | ORAL_TABLET | Freq: Once | ORAL | Status: AC
  Administered 2014-03-09: 0.5 mg via ORAL
  Filled 2014-03-09: qty 1

## 2014-03-09 MED ORDER — SODIUM CHLORIDE 0.9 % IV BOLUS (SEPSIS)
1000.0000 mL | Freq: Once | INTRAVENOUS | Status: AC
  Administered 2014-03-09: 1000 mL via INTRAVENOUS

## 2014-03-09 MED ORDER — ALPRAZOLAM 0.25 MG PO TABS: 0.2500 mg | ORAL_TABLET | Freq: Three times a day (TID) | ORAL | Status: DC | PRN

## 2014-03-09 MED ORDER — IBUPROFEN 200 MG PO TABS: 600.0000 mg | ORAL_TABLET | Freq: Three times a day (TID) | ORAL | Status: DC | PRN

## 2014-03-09 MED ORDER — ACETAMINOPHEN 325 MG PO TABS: 650.0000 mg | ORAL_TABLET | ORAL | Status: DC | PRN

## 2014-03-09 MED ORDER — LORAZEPAM 2 MG/ML IJ SOLN
1.0000 mg | Freq: Once | INTRAMUSCULAR | Status: AC
  Administered 2014-03-09: 1 mg via INTRAVENOUS
  Filled 2014-03-09: qty 1

## 2014-03-09 NOTE — ED Notes (Signed)
Pt able to ambulate to restroom. TTS in to speak to patient now. Warm blanket given. Son at bedside.

## 2014-03-09 NOTE — ED Notes (Signed)
Pt reports she has been in a lot of stress lately.  Her husband has been in the hospital and has been taking care of him along with her having to have dialysis often.  She states that she is just overwhelmed.  She reports that [employer] went to her house today and was instructed to come to the ED for psych eval for her stress.  Pt was teary, and reports feeling humiliated when the cops went to her house.  Pt denies SI/HI at this time

## 2014-03-09 NOTE — Discharge Instructions (Signed)
Take xanax as needed.   You need to try and get some more help at home and don't try and handle everything yourself.   Follow up with your doctor.  Return to ER if you have thoughts of harming yourself or others, depression.

## 2014-03-09 NOTE — ED Notes (Signed)
MD Darl Householder notified case manager advised for patient to have TTS consult.

## 2014-03-09 NOTE — ED Notes (Signed)
Initial contact-A&O x4. Ambulatory. Son at the bedside. Per patient "I'm 79 years old, I'm bipolar and in my manic phase, my husband has been in and out of the hospital since February and it's just too much for a woman my age to handle." Pt lives with her husband. Has 2 daughters but says one "stresses her out too much" and she would rather her not come around. Has been on dialysis x2 years. Appears very anxious. Medication given. Will reassess response.

## 2014-03-09 NOTE — ED Notes (Signed)
Dr Yao in room.  

## 2014-03-09 NOTE — ED Provider Notes (Signed)
Pt has been seen by TTS, does not meet criteria for admission, given outpatient resources.  Kalman Drape, MD 03/09/14 2352

## 2014-03-09 NOTE — ED Provider Notes (Addendum)
CSN: [id number]     Arrival date & time 03/09/14  1727 History   First MD Initiated Contact with Patient 03/09/14 1901     Chief Complaint  Patient presents with  . Anxiety     (Consider location/radiation/quality/duration/timing/severity/associated sxs/prior Treatment) The history is provided by the patient.  Diane [name] is a 79 y.o. female hx of ESRD on HD, restless leg syndrome here with anxiety. She takes her of her sick husband at home. She states that she is just overwhelmed. She only has her son to come and help her and sometimes an aide. She doesn't know what to do at this point. Denies any active suicidal ideations but sometimes thinks about it it's better if she doesn't live but wants to stay alive to help her husband. Denies history of depression.    Past Medical History  Diagnosis Date  . Anemia in chronic kidney disease(285.21)   . Bipolar disorder, unspecified   . Iron deficiency anemia, unspecified   . Secondary hyperparathyroidism (of renal origin)   . Restless legs syndrome (RLS)   . Unspecified hypertensive kidney disease with chronic kidney disease stage V or end stage renal disease   . 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 Personal history of colonic polyps   . Diverticulosis of colon (without mention of hemorrhage) 2006    Colonoscopy Dr. Oletta Lamas  . Coronary artery disease   . Anginal pain   . S/P CABG x 5, 08/15/12, LIMA-LAD;VG-diag;VG-OM; seq VG-PDA,PLA  08/18/2012  . Anxiety   . Depression    Past Surgical History  Procedure Laterality Date  . Arteriovenous graft placement  11/2010    left upper - AV[facility] Dr. Donnetta Hutching  . Arteriovenous graft placement  12/2009    left lower - [facility] Dr. Donnetta Hutching  . Av fistula placement  07/2009    right upper AVF Dr. Donnetta Hutching  . Av fistula placement  01/2002    right lower Dr. Donnetta Hutching  . Cardiac catheterization  08/12/2012  . Abdominal hysterectomy    . Coronary  artery bypass graft  08/14/2012    Procedure: CORONARY ARTERY BYPASS GRAFTING (CABG);  Surgeon: Gaye Pollack, MD;  Location: [place];  Service: Open Heart Surgery;  Laterality: N/A;  times five, using left internal mammary  . Endovein harvest of greater saphenous vein  08/14/2012    Procedure: ENDOVEIN HARVEST OF GREATER SAPHENOUS VEIN;  Surgeon: Gaye Pollack, MD;  Location: [place];  Service: Open Heart Surgery;  Laterality: Bilateral;  . Eye surgery     No family history on file. History  Substance Use Topics  . Smoking status: Never Smoker   . Smokeless tobacco: Never Used  . Alcohol Use: No   OB History   Grav Para Term Preterm Abortions TAB SAB Ect Mult Living                 Review of Systems  Psychiatric/Behavioral: The patient is nervous/anxious.   All other systems reviewed and are negative.     Allergies  Ambien; Sulfa antibiotics; Latex; and Tape  Home Medications   Prior to Admission medications   Medication Sig Start Date End Date Taking? Authorizing Provider  aspirin 81 MG tablet Take 81 mg by mouth daily.    Historical Provider, MD  b complex-vitamin c-folic acid (NEPHRO-VITE) 0.8 MG TABS Take 0.8 mg by mouth daily.    Historical Provider, MD  calcium acetate (PHOSLO)  667 MG capsule Take 667 mg by mouth 3 (three) times daily with meals.  07/05/13   Historical Provider, MD  ciprofloxacin (CIPRO) 250 MG tablet Take 1 tablet (250 mg total) by mouth 2 (two) times daily. 02/19/14   Leandrew Koyanagi, MD  clorazepate (TRANXENE) 3.75 MG tablet Take 1 tablet (3.75 mg total) by mouth at bedtime. For anxiety 12/17/13   Tiffany L Reed, DO  gabapentin (NEURONTIN) 100 MG capsule Take 200 mg by mouth 2 (two) times daily.     Historical Provider, MD  HYDROcodone-acetaminophen (NORCO/VICODIN) 5-325 MG per tablet Take 1 tablet by mouth every 6 (six) hours as needed for moderate pain.    Historical Provider, MD  hydrOXYzine (ATARAX/VISTARIL) 50 MG tablet Take 50 mg by mouth at  bedtime.  09/04/12   Historical Provider, MD  Ketotifen Fumarate (ALAWAY OP) Place 1 drop into both eyes 2 (two) times daily as needed. For allergies    Historical Provider, MD  metoprolol tartrate (LOPRESSOR) 25 MG tablet Take 12.5 mg by mouth 2 (two) times daily.    Historical Provider, MD  mometasone (ELOCON) 0.1 % cream Apply 1 application topically as needed (for itching from tape).  08/10/13   Historical Provider, MD  NITROSTAT 0.4 MG SL tablet Place 0.4 mg under the tongue every 5 (five) minutes as needed.  07/07/12   Historical Provider, MD  Nutritional Supplements (FEEDING SUPPLEMENT, NEPRO CARB STEADY,) LIQD Take 237 mLs by mouth as needed (missed meal during dialysis.). 12/16/13   Thurnell Lose, MD  olopatadine (PATANOL) 0.1 % ophthalmic solution Place 1 drop into both eyes 2 (two) times daily as needed (for allergies). 08/19/12   Erin Barrett, PA-C  polyethylene glycol (MIRALAX / GLYCOLAX) packet Take 17 g by mouth daily as needed. For constipation    Historical Provider, MD   BP 90/51  Pulse 72  Temp(Src) 98.2 F (36.8 C) (Oral)  Resp 21  SpO2 98% Physical Exam  Nursing note and vitals reviewed. Constitutional: She is oriented to person, place, and time.  Anxious, overwhelmed   HENT:  Head: Normocephalic.  Mouth/Throat: Oropharynx is clear and moist.  Eyes: Conjunctivae are normal. Pupils are equal, round, and reactive to light.  Neck: Normal range of motion. Neck supple.  Cardiovascular: Normal rate, regular rhythm and normal heart sounds.   Pulmonary/Chest: Effort normal and breath sounds normal. No respiratory distress. She has no wheezes. She has no rales.  Abdominal: Soft. Bowel sounds are normal. She exhibits no distension. There is no tenderness. There is no rebound and no guarding.  Musculoskeletal: Normal range of motion.  Neurological: She is alert and oriented to person, place, and time.  Skin: Skin is warm and dry.  Psychiatric:  Tearful, not suicidal      ED Course  Procedures (including critical care time) Labs Review Labs Reviewed - No data to display  Imaging Review No results found.   EKG Interpretation None      MDM   Final diagnoses:  None   Diane [name] is a 79 y.o. female here with anxiety. I don't think she has suicidal or homicidal ideation. She is just overwhelmed from taking care of her husband. Can give xanax to help calm her down. I called case management to get her some resources to help her husband.   9:12 PM Kim from case management talked to her and gave her list of resources. However, she doesn't want them. Felt about the same with xanax. I encourage her to  get an aide to help her and take xanax as needed.   9:23 PM When the nurse tried to d/c her, patient became agitated and tearful and more depressed. Will get psych clearance labs and get TTS consult.      Wandra Arthurs, MD 03/09/14 2113  Wandra Arthurs, MD 03/09/14 2123

## 2014-03-09 NOTE — Progress Notes (Signed)
CARE MANAGEMENT ED NOTE 03/09/2014  Patient:  Diane [name], Diane [name]   Account Number:  [id number]  Date Initiated:  03/09/2014  Documentation initiated by:  Jackelyn Poling  Subjective/Objective Assessment:   79 yr old united health care a[employer] medicare complete [place]  pt at [facility] ED for c/o anxiety, overwhelmed l has been in a lot of stress lately,  husband now out of hospital & has been taking care of him     Subjective/Objective Assessment Detail:   "I'm 79 years old, I'm bipolar and in my manic phase" Reports she was not surprise today when she "told him how I felt and he did not like it" & HHRN attempted to get her to go to ED via EMS, she refused & then [employer] showed up to bring her to ED Pt reports HHRN reported "she was afraid I would do something to myself"  Pt drives herself to dialysis [facility] pmh CKD stage V, ESRD, s/p CABG  Has been to [facility] services Pt states she enjoyed her stays  Pt noted with constant talking during CM assessment Needing frequent redirection to stay on subject at hand  Has 2 daughters & son Tanna Furry. Herbie Baltimore has neuropathy medical issues with use of a cane, Retired He assists as much as possible  Pt frequently responded negatively to resources offered Stated she was "a slave and I'm tired of it" Her first statement aft cm introduced herself that she had a psychiatrist Dr Norma Fredrickson that she prefers  Reports her husband is an oncology, cardiac pt that does not comply with tx Husband, Herbie Baltimore, seen by [employer], PT with aide the family paid for day & night $35 Planning to stop night aide for budget reasons  Pt had been given xanax when 1st seen by CM then when CM return she was still anxious & stating she did not want to go home Went from lying to sitting on side of bed with son attempting to calm her     Action/Plan:   ED CM consulted by EDP, Yao to speak with pt about possible community resources to assist pt to assist her husband to help with pt's  stress. Cm spoke with pt & son Herbie Baltimore at bedside Allowed pt time to ventilate her feelings. Redirected at   Action/Plan Detail:   times.  CM attempted to offer resources to delegate home care tasks to others.  CM reviewed with son & pt ACE, [facility] area agency on agency and [facility] resources of [place] see below [employer] with EDP about pt behavior during 2nd visit   Anticipated DC Date:       Status Recommendation to Physician:   Result of Recommendation:    Other ED Services  Consult Working Plan   In-house referral  Clinical Social Worker   DC Forensic scientist  Other  Outpatient Services - Pt will follow up    Choice offered to / List presented to:            Status of service:  Completed, signed off  ED Comments:   ED Comments Detail:  ED CM emailed referral to [email or web address]--Please contact [place] for possible eligibility of meals on wheels She is a dialysis patient and he is an oncology & cardiac patient. Mrs Bosak was a t [facility] on 03/09/14 for anxiety  Pt also agreed to [facility] ([place]) Reviewed [facility] resources [facility] [employer]: Medical sales representative, case assistance, mobile meals, senior wheels  medial transportation program and telecare [facility] adult day care services to possible offer to her husband [place] area agency on agency-for caregiver & respite services

## 2014-03-10 LAB — URINALYSIS, ROUTINE W REFLEX MICROSCOPIC
BILIRUBIN URINE: NEGATIVE
Glucose, UA: 100 mg/dL — AB
HGB URINE DIPSTICK: NEGATIVE
Ketones, ur: NEGATIVE mg/dL
Leukocytes, UA: NEGATIVE
Nitrite: NEGATIVE
Protein, ur: 30 mg/dL — AB
SPECIFIC GRAVITY, URINE: 1.006 (ref 1.005–1.030)
Urobilinogen, UA: 0.2 mg/dL (ref 0.0–1.0)
pH: 8 (ref 5.0–8.0)

## 2014-03-10 LAB — URINE MICROSCOPIC-ADD ON

## 2014-03-10 NOTE — BH Assessment (Signed)
Tele Assessment Note   Diane [name] is a 79 y.o. female who presents to [facility] with depression and anxiety.  Pt denies SI\/HI/AVH.  Pt says last Saturday, she was SI, no plan or intent to harm self because she was so frustrated.  Pt states she is the sole caregiver for her husband and says is stressed out and she has health problems(kidney failure).  Pt says with caring for her husband and the chronic kidney problems she is experiencing, she is having a hard time coping.  Pt says she was seeing Dr. Martina Sinner, however she has not seen him several years, she has never engaged in outpatient therapy.  Pt has [facility] with [facility](in her 30's)  and [facility](2001).  Pt is endorsing depressive sxs: crying spells, irritability and anhedonia.  Pt is requesting referrals for psychiatry/therapy.           Axis I: Bipolar, mixed Axis II: Deferred Axis III:  Past Medical History  Diagnosis Date  . Anemia in chronic kidney disease(285.21)   . Bipolar disorder, unspecified   . Iron deficiency anemia, unspecified   . Secondary hyperparathyroidism (of renal origin)   . Restless legs syndrome (RLS)   . Unspecified hypertensive kidney disease with chronic kidney disease stage V or end stage renal disease   . 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 Personal history of colonic polyps   . Diverticulosis of colon (without mention of hemorrhage) 2006    Colonoscopy Dr. Oletta Lamas  . Coronary artery disease   . Anginal pain   . S/P CABG x 5, 08/15/12, LIMA-LAD;VG-diag;VG-OM; seq VG-PDA,PLA  08/18/2012  . Anxiety   . Depression    Axis IV: other psychosocial or environmental problems, problems related to social environment and problems with primary support group Axis V: 51-60 moderate symptoms  Past Medical History:  Past Medical History  Diagnosis Date  . Anemia in chronic kidney disease(285.21)   . Bipolar disorder,  unspecified   . Iron deficiency anemia, unspecified   . Secondary hyperparathyroidism (of renal origin)   . Restless legs syndrome (RLS)   . Unspecified hypertensive kidney disease with chronic kidney disease stage V or end stage renal disease   . 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 Personal history of colonic polyps   . Diverticulosis of colon (without mention of hemorrhage) 2006    Colonoscopy Dr. Oletta Lamas  . Coronary artery disease   . Anginal pain   . S/P CABG x 5, 08/15/12, LIMA-LAD;VG-diag;VG-OM; seq VG-PDA,PLA  08/18/2012  . Anxiety   . Depression     Past Surgical History  Procedure Laterality Date  . Arteriovenous graft placement  11/2010    left upper - [facility] Dr. Donnetta Hutching  . Arteriovenous graft placement  12/2009    left lower - [facility] Dr. Donnetta Hutching  . Av fistula placement  07/2009    right upper AVF Dr. Donnetta Hutching  . Av fistula placement  01/2002    right lower Dr. Donnetta Hutching  . Cardiac catheterization  08/12/2012  . Abdominal hysterectomy    . Coronary artery bypass graft  08/14/2012    Procedure: CORONARY ARTERY BYPASS GRAFTING (CABG);  Surgeon: Gaye Pollack, MD;  Location: [place];  Service: Open Heart Surgery;  Laterality: N/A;  times five, using left internal mammary  . Endovein harvest of greater saphenous vein  08/14/2012    Procedure: ENDOVEIN HARVEST OF GREATER SAPHENOUS VEIN;  Surgeon: Gaye Pollack, MD;  Location: [place];  Service: Open Heart Surgery;  Laterality: Bilateral;  . Eye surgery      Family History: No family history on file.  Social History:  reports that she has never smoked. She has never used smokeless tobacco. She reports that she does not drink alcohol or use illicit drugs.  Additional Social History:  Alcohol / Drug Use Pain Medications: See MAR  Prescriptions: See MAR  Over the Counter: See MAR  History of alcohol / drug use?: No history of alcohol / drug abuse Longest period of sobriety  (when/how long): None   CIWA: CIWA-Ar BP: 97/41 mmHg Pulse Rate: 68 COWS:    Allergies:  Allergies  Allergen Reactions  . Ambien [Zolpidem Tartrate]     Stays awake  . Sulfa Antibiotics Other (See Comments)    Migraine   . Latex Rash  . Tape Rash    Can only use paper tape     Home Medications:  (Not in a hospital admission)  OB/GYN Status:  No LMP recorded. Patient has had a hysterectomy.  General Assessment Data Location of Assessment: [facility] ED Is this a Tele or Face-to-Face Assessment?: Tele Assessment Is this an Initial Assessment or a Re-assessment for this encounter?: Initial Assessment Living Arrangements: Spouse/significant other (Lives in spouse ) Can pt return to current living arrangement?: Yes Admission Status: Voluntary Is patient capable of signing voluntary admission?: Yes Transfer from: [facility] Referral Source: MD  Medical Screening Exam ([place]) Medical Exam completed: No Reason for MSE not completed: Other: (None )  [facility] Crisis Care Plan Living Arrangements: Spouse/significant other (Lives in spouse ) Name of Psychiatrist: None  Name of Therapist: None   Education Status Is patient currently in school?: No Current Grade: None  Highest grade of school patient has completed: None  Name of school: None  Contact person: None   Risk to self Suicidal Ideation: No Suicidal Intent: No Is patient at risk for suicide?: No Suicidal Plan?: No Access to Means: No What has been your use of drugs/alcohol within the last 12 months?: Pt denies  Previous Attempts/Gestures: No How many times?: 0 Other Self Harm Risks: None  Triggers for Past Attempts: None known Intentional Self Injurious Behavior: None Family Suicide History: No Recent stressful life event(s): Other (Comment) (Spouse's caregiver; Health(Kidney) ) Persecutory voices/beliefs?: No Depression: Yes Depression Symptoms: Loss of interest in usual pleasures;Feeling  angry/irritable;Fatigue Substance abuse history and/or treatment for substance abuse?: No Suicide prevention information given to non-admitted patients: Not applicable  Risk to Others Homicidal Ideation: No Thoughts of Harm to Others: No Current Homicidal Intent: No Current Homicidal Plan: No Access to Homicidal Means: No Identified Victim: None  History of harm to others?: No Assessment of Violence: None Noted Violent Behavior Description: None  Does patient have access to weapons?: No Criminal Charges Pending?: No Does patient have a court date: No  Psychosis Hallucinations: None noted Delusions: None noted  Mental Status Report Appear/Hygiene: In scrubs Eye Contact: Fair Motor Activity: Unremarkable Speech: Logical/coherent;Soft Level of Consciousness: Alert Mood: Depressed Affect: Depressed Anxiety Level: None Thought Processes: Coherent;Relevant Judgement: Impaired Orientation: Person;Place;Time;Situation Obsessive Compulsive Thoughts/Behaviors: None  Cognitive Functioning Concentration: Decreased Memory: Recent Intact;Remote Intact IQ: Average Insight: Fair Impulse Control: Good Appetite: Fair Weight Loss: 0 Weight Gain: 0 Sleep: Decreased Total Hours of Sleep: 5 Vegetative Symptoms: None  ADLScreening [facility] Assessment Services) Patient's cognitive ability  adequate to safely complete daily activities?: Yes Patient able to express need for assistance with ADLs?: Yes Independently performs ADLs?: Yes (appropriate for developmental age)  Prior Inpatient Therapy Prior Inpatient Therapy: Yes Prior Therapy Dates: 2001 Prior Therapy Facilty/Provider(s): [facility]; [place]  Reason for Treatment: Depression   Prior Outpatient Therapy Prior Outpatient Therapy: Yes Prior Therapy Dates: Unk  Prior Therapy Facilty/Provider(s): Dr. Plovasky--(previous patient) Reason for Treatment: Med Mgt   ADL Screening (condition at time of  admission) Patient's cognitive ability adequate to safely complete daily activities?: Yes Is the patient deaf or have difficulty hearing?: No Does the patient have difficulty seeing, even when wearing glasses/contacts?: No Does the patient have difficulty concentrating, remembering, or making decisions?: No Patient able to express need for assistance with ADLs?: Yes Does the patient have difficulty dressing or bathing?: No Independently performs ADLs?: Yes (appropriate for developmental age) Does the patient have difficulty walking or climbing stairs?: No Weakness of Legs: None Weakness of Arms/Hands: None  Home Assistive Devices/Equipment Home Assistive Devices/Equipment: None  Therapy Consults (therapy consults require a physician order) PT Evaluation Needed: No OT Evalulation Needed: No SLP Evaluation Needed: No Abuse/Neglect Assessment (Assessment to be complete while patient is alone) Physical Abuse: Denies Verbal Abuse: Denies Sexual Abuse: Denies Exploitation of patient/patient's resources: Denies Self-Neglect: Denies Values / Beliefs Cultural Requests During Hospitalization: None Spiritual Requests During Hospitalization: None Consults Spiritual Care Consult Needed: No Social Work Consult Needed: No Regulatory affairs officer (For Healthcare) Advance Directive: Patient does not have advance directive;Patient would not like information Pre-existing out of facility DNR order (yellow form or pink MOST form): No Nutrition Screen- [facility] Adult/[facility] Patient's home diet: Regular  Additional Information 1:1 In Past 12 Months?: No CIRT Risk: No Elopement Risk: No Does patient have medical clearance?: Yes     Disposition:  Disposition Initial Assessment Completed for this Encounter: Yes Disposition of Patient: Other dispositions (Outpatient referral for psychiatrist ) Other disposition(s): Information only  Girtha Rm 03/10/2014 2:31 AM

## 2014-03-10 NOTE — Progress Notes (Signed)
[facility] ED Cm spoke with pt on 03/09/14 evening She states resources are needed for her and her spouse CM sent [facility] referral via email

## 2014-03-11 NOTE — Progress Notes (Signed)
Received referral for [facility] services. Will attempt to reach via phone.  Othell Jaime, MSN- RN,BSN- [facility] Hospital Liaison- [phone] 

## 2014-04-29 ENCOUNTER — Ambulatory Visit (INDEPENDENT_AMBULATORY_CARE_PROVIDER_SITE_OTHER): Payer: Medicare Other | Admitting: Emergency Medicine

## 2014-04-29 VITALS — BP 110/58 | HR 85 | Temp 98.4°F | Resp 16 | Ht 60.5 in | Wt 111.0 lb

## 2014-04-29 DIAGNOSIS — J018 Other acute sinusitis: Secondary | ICD-10-CM

## 2014-04-29 MED ORDER — AMOXICILLIN-POT CLAVULANATE 875-125 MG PO TABS: 1.0000 | ORAL_TABLET | Freq: Two times a day (BID) | ORAL | Status: DC

## 2014-04-29 NOTE — Patient Instructions (Signed)
Sinusitis Sinusitis is redness, soreness, and swelling (inflammation) of the paranasal sinuses. Paranasal sinuses are air pockets within the bones of your face (beneath the eyes, the middle of the forehead, or above the eyes). In healthy paranasal sinuses, mucus is able to drain out, and air is able to circulate through them by way of your nose. However, when your paranasal sinuses are inflamed, mucus and air can become trapped. This can allow bacteria and other germs to grow and cause infection. Sinusitis can develop quickly and last only a short time (acute) or continue over a long period (chronic). Sinusitis that lasts for more than 12 weeks is considered chronic.  CAUSES  Causes of sinusitis include:  Allergies.  Structural abnormalities, such as displacement of the cartilage that separates your nostrils (deviated septum), which can decrease the air flow through your nose and sinuses and affect sinus drainage.  Functional abnormalities, such as when the small hairs (cilia) that line your sinuses and help remove mucus do not work properly or are not present. SYMPTOMS  Symptoms of acute and chronic sinusitis are the same. The primary symptoms are pain and pressure around the affected sinuses. Other symptoms include:  Upper toothache.  Earache.  Headache.  Bad breath.  Decreased sense of smell and taste.  A cough, which worsens when you are lying flat.  Fatigue.  Fever.  Thick drainage from your nose, which often is green and may contain pus (purulent).  Swelling and warmth over the affected sinuses. DIAGNOSIS  Your caregiver will perform a physical exam. During the exam, your caregiver may:  Look in your nose for signs of abnormal growths in your nostrils (nasal polyps).  Tap over the affected sinus to check for signs of infection.  View the inside of your sinuses (endoscopy) with a special imaging device with a light attached (endoscope), which is inserted into your  sinuses. If your caregiver suspects that you have chronic sinusitis, one or more of the following tests may be recommended:  Allergy tests.  Nasal culture--A sample of mucus is taken from your nose and sent to a lab and screened for bacteria.  Nasal cytology--A sample of mucus is taken from your nose and examined by your caregiver to determine if your sinusitis is related to an allergy. TREATMENT  Most cases of acute sinusitis are related to a viral infection and will resolve on their own within 10 days. Sometimes medicines are prescribed to help relieve symptoms (pain medicine, decongestants, nasal steroid sprays, or saline sprays).  However, for sinusitis related to a bacterial infection, your caregiver will prescribe antibiotic medicines. These are medicines that will help kill the bacteria causing the infection.  Rarely, sinusitis is caused by a fungal infection. In theses cases, your caregiver will prescribe antifungal medicine. For some cases of chronic sinusitis, surgery is needed. Generally, these are cases in which sinusitis recurs more than 3 times per year, despite other treatments. HOME CARE INSTRUCTIONS   Drink plenty of water. Water helps thin the mucus so your sinuses can drain more easily.  Use a humidifier.  Inhale steam 3 to 4 times a day (for example, sit in the bathroom with the shower running).  Apply a warm, moist washcloth to your face 3 to 4 times a day, or as directed by your caregiver.  Use saline nasal sprays to help moisten and clean your sinuses.  Take over-the-counter or prescription medicines for pain, discomfort, or fever only as directed by your caregiver. SEEK IMMEDIATE MEDICAL CARE IF:    You have increasing pain or severe headaches.  You have nausea, vomiting, or drowsiness.  You have swelling around your face.  You have vision problems.  You have a stiff neck.  You have difficulty breathing. MAKE SURE YOU:   Understand these  instructions.  Will watch your condition.  Will get help right away if you are not doing well or get worse. Document Released: 10/07/2005 Document Revised: 12/30/2011 Document Reviewed: 10/22/2011 [employer] Patient Information 2015 [employer]. This information is not intended to replace advice given to you by your health care provider. Make sure you discuss any questions you have with your health care provider.  

## 2014-04-29 NOTE — Progress Notes (Signed)
[facility] [address], [place] [address] [phone]  Date:  04/29/2014   Name:  Diane [name]   DOB:  1935/08/24   MRN:  [id number]  PCP:  Donnajean Lopes, MD    Chief Complaint: Eye Drainage and Sore Throat   History of Present Illness:  Diane [name] is a 79 y.o. very pleasant female patient who presents with the following:  Ill with nasal congestion and a purulent post nasal drip. No fever or chills or cough, wheezing or shortness of breath.  Has pain in left orbit and forehead and sore throat.  No improvement with over the counter medications or other home remedies. jd   Patient Active Problem List   Diagnosis Date Noted  . CKD (chronic kidney disease) stage V requiring chronic dialysis 12/13/2013  . Physical deconditioning 12/13/2013  . Sepsis 12/12/2013  . Influenza A 12/12/2013  . Peripheral neuropathy 08/11/2013  . Cardiovascular stress test abnormal and chest pain with tachycardia 08/20/2012  . Anemia,chronic and acute post op 08/20/2012  . S/P CABG x 5, 08/15/12, LIMA-LAD;VG-diag;VG-OM; seq VG-PDA,PLA  08/18/2012  . CAD (coronary artery disease): Severe three vessel 08/13/2012  . ESRD (end stage renal disease) on dialysis 08/13/2012  . Knee pain, acute, right 08/13/2012    Past Medical History  Diagnosis Date  . Anemia in chronic kidney disease(285.21)   . Bipolar disorder, unspecified   . Iron deficiency anemia, unspecified   . Secondary hyperparathyroidism (of renal origin)   . Restless legs syndrome (RLS)   . Unspecified hypertensive kidney disease with chronic kidney disease stage V or end stage renal disease   . 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 Personal history of colonic polyps   . Diverticulosis of colon (without mention of hemorrhage) 2006    Colonoscopy Dr. Oletta Lamas  . Coronary artery disease   . Anginal pain   . S/P CABG x 5, 08/15/12,  LIMA-LAD;VG-diag;VG-OM; seq VG-PDA,PLA  08/18/2012  . Anxiety   . Depression     Past Surgical History  Procedure Laterality Date  . Arteriovenous graft placement  11/2010    left upper - [facility] Dr. Donnetta Hutching  . Arteriovenous graft placement  12/2009    left lower - [facility] Dr. Donnetta Hutching  . Av fistula placement  07/2009    right upper AVF Dr. Donnetta Hutching  . Av fistula placement  01/2002    right lower Dr. Donnetta Hutching  . Cardiac catheterization  08/12/2012  . Abdominal hysterectomy    . Coronary artery bypass graft  08/14/2012    Procedure: CORONARY ARTERY BYPASS GRAFTING (CABG);  Surgeon: Gaye Pollack, MD;  Location: [facility];  Service: Open Heart Surgery;  Laterality: N/A;  times five, using left internal mammary  . Endovein harvest of greater saphenous vein  08/14/2012    Procedure: ENDOVEIN HARVEST OF GREATER SAPHENOUS VEIN;  Surgeon: Gaye Pollack, MD;  Location: [facility];  Service: Open Heart Surgery;  Laterality: Bilateral;  . Eye surgery      History  Substance Use Topics  . Smoking status: Never Smoker   . Smokeless tobacco: Never Used  . Alcohol Use: No    History reviewed. No pertinent family history.  Allergies  Allergen Reactions  . Ambien [Zolpidem Tartrate]     Stays awake  . Sulfa Antibiotics Other (See Comments)    Migraine   . Latex Rash  . Tape Rash  Can only use paper tape     Medication list has been reviewed and updated.  Current Outpatient Prescriptions on File Prior to Visit  Medication Sig Dispense Refill  . ALPRAZolam (XANAX) 0.25 MG tablet Take 1 tablet (0.25 mg total) by mouth 3 (three) times daily as needed for anxiety.  10 tablet  0  . aspirin 81 MG tablet Take 81 mg by mouth daily.      Marland Kitchen b complex-vitamin c-folic acid (NEPHRO-VITE) 0.8 MG TABS Take 0.8 mg by mouth daily.      . calcium acetate (PHOSLO) 667 MG capsule Take 667 mg by mouth 2 (two) times daily.       . ciprofloxacin (CIPRO) 250 MG tablet Take 1 tablet (250 mg total) by mouth 2 (two) times daily.   20 tablet  0  . clorazepate (TRANXENE) 3.75 MG tablet Take 3.75 mg by mouth 2 (two) times daily as needed for anxiety.      . gabapentin (NEURONTIN) 100 MG capsule Take 100 mg by mouth 2 (two) times daily.       Marland Kitchen HYDROcodone-acetaminophen (NORCO/VICODIN) 5-325 MG per tablet Take 1 tablet by mouth every 6 (six) hours as needed for moderate pain.      . hydrOXYzine (ATARAX/VISTARIL) 50 MG tablet Take 50 mg by mouth at bedtime.       Marland Kitchen Ketotifen Fumarate (ALAWAY OP) Place 1 drop into both eyes 2 (two) times daily as needed. For allergies      . metoprolol tartrate (LOPRESSOR) 25 MG tablet Take 12.5-25 mg by mouth See admin instructions. 1/2 tab (12.5mg ) twice daily on non-dialysis days (Mon, Wed, Fri, Sun) and 1 tablet at bedtime on dialysis days (Tues, Thurs, Sat)      . mometasone (ELOCON) 0.1 % cream Apply 1 application topically as needed (for itching from tape).       Marland Kitchen NITROSTAT 0.4 MG SL tablet Place 0.4 mg under the tongue every 5 (five) minutes as needed.       . polyethylene glycol (MIRALAX / GLYCOLAX) packet Take 17 g by mouth daily as needed. For constipation       No current facility-administered medications on file prior to visit.    Review of Systems:  As per HPI, otherwise negative.    Physical Examination: Filed Vitals:   04/29/14 1151  BP: 110/58  Pulse: 85  Temp: 98.4 F (36.9 C)  Resp: 16   Filed Vitals:   04/29/14 1151  Height: 5' 0.5" (1.537 m)  Weight: 111 lb (50.349 kg)   Body mass index is 21.31 kg/(m^2). Ideal Body Weight: Weight in (lb) to have BMI = 25: 129.9  GEN: WDWN, NAD, Non-toxic, A & O x 3 HEENT: Atraumatic, Normocephalic. Neck supple. No masses, No LAD. Ears and Nose: No external deformity. CV: RRR, No M/G/R. No JVD. No thrill. No extra heart sounds. PULM: CTA B, no wheezes, crackles, rhonchi. No retractions. No resp. distress. No accessory muscle use. ABD: S, NT, ND, +BS. No rebound. No HSM. EXTR: No c/c/e NEURO Normal gait.  PSYCH:  Normally interactive. Conversant. Not depressed or anxious appearing.  Calm demeanor.    Assessment and Plan: Sinusitis augmentin  Signed,  Ellison Carwin, MD

## 2014-05-03 ENCOUNTER — Telehealth: Payer: Self-pay

## 2014-05-03 NOTE — Telephone Encounter (Signed)
PT STATES THE MEDICINE SHE CAUSING HER BOWELS TO MOVE MORE OFTEN AND HER STOOL IS LOOSE. [place] [id number]

## 2014-05-03 NOTE — Telephone Encounter (Signed)
Diarrhea Began Sunday , she is taking imodium, helped but no relieved.  I advised her to take probiotics OTC. Discontinued amoxicillin yesterday. She had 3 days dosage of antibiotics.  She has had 6 stools today alone. This is making her very weak, she was unable to keep her dialysis appt. today due to this.   Eye is better using systane and alloway.

## 2014-05-04 NOTE — Telephone Encounter (Signed)
She was given the generic of augmentin, which is a combination of amoxicillin and clavulanic acid.  The clavulanic acid is the part that really increases the risk of diarrhea with the medication.  We can: 1. Stay off an antibiotic for now, work on hydration, using Immodium to slow things down until the symptoms resolve.  2. Change to Amoxicillin alone, work on hydration, using Immodium to slow things down until the symptoms resolve.  If she chooses amoxicillin, 875 mg 1 PO BID x 10 days, #20, NO RF.

## 2014-05-04 NOTE — Telephone Encounter (Signed)
Pt does not want to take any more antibiotics. She is feeling much better. She will give [place] a call if she changes her mind or her symptoms return.

## 2014-07-22 IMAGING — CR DG CHEST 2V
2 series · 2 of 2 positions shown · non-contrast
Comparison: 12/01/2011.

CLINICAL DATA: Chest pain. Pre cardiac cath.

CHEST - 2 VIEW

[view not recorded (1 of 2)]
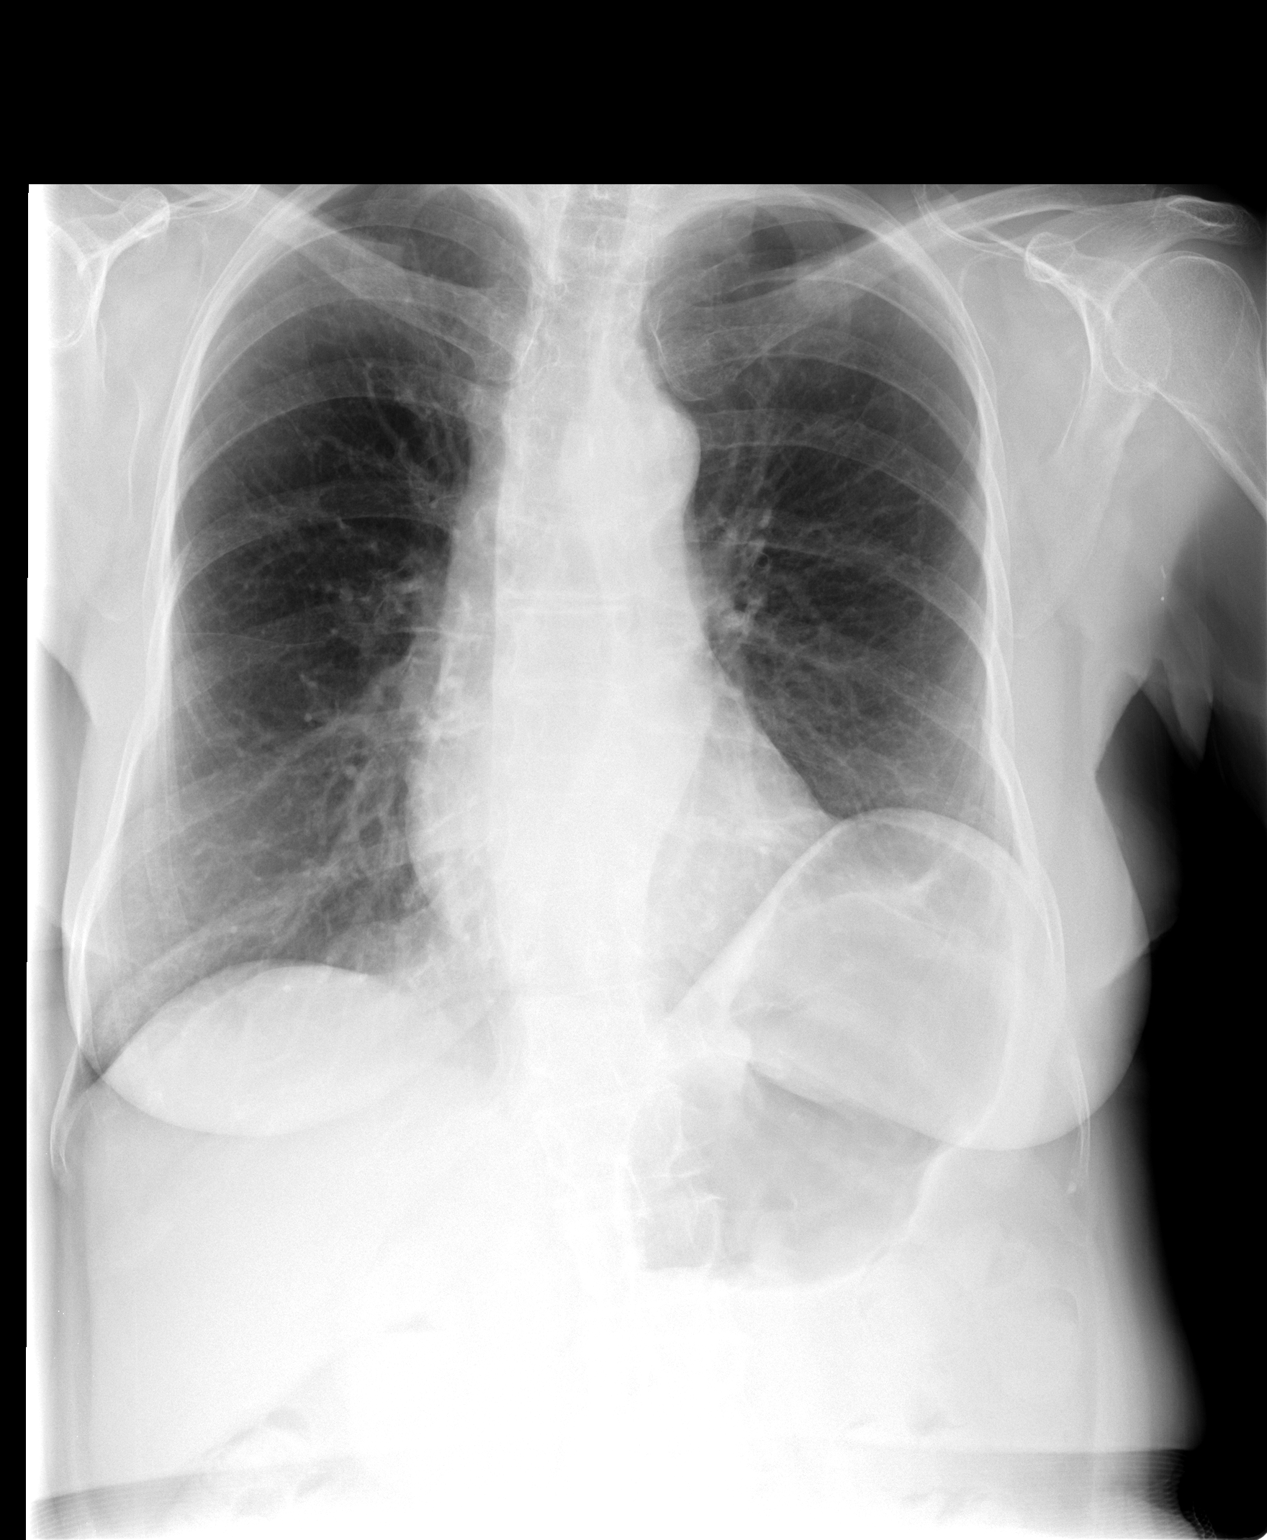

[view not recorded (2 of 2)]
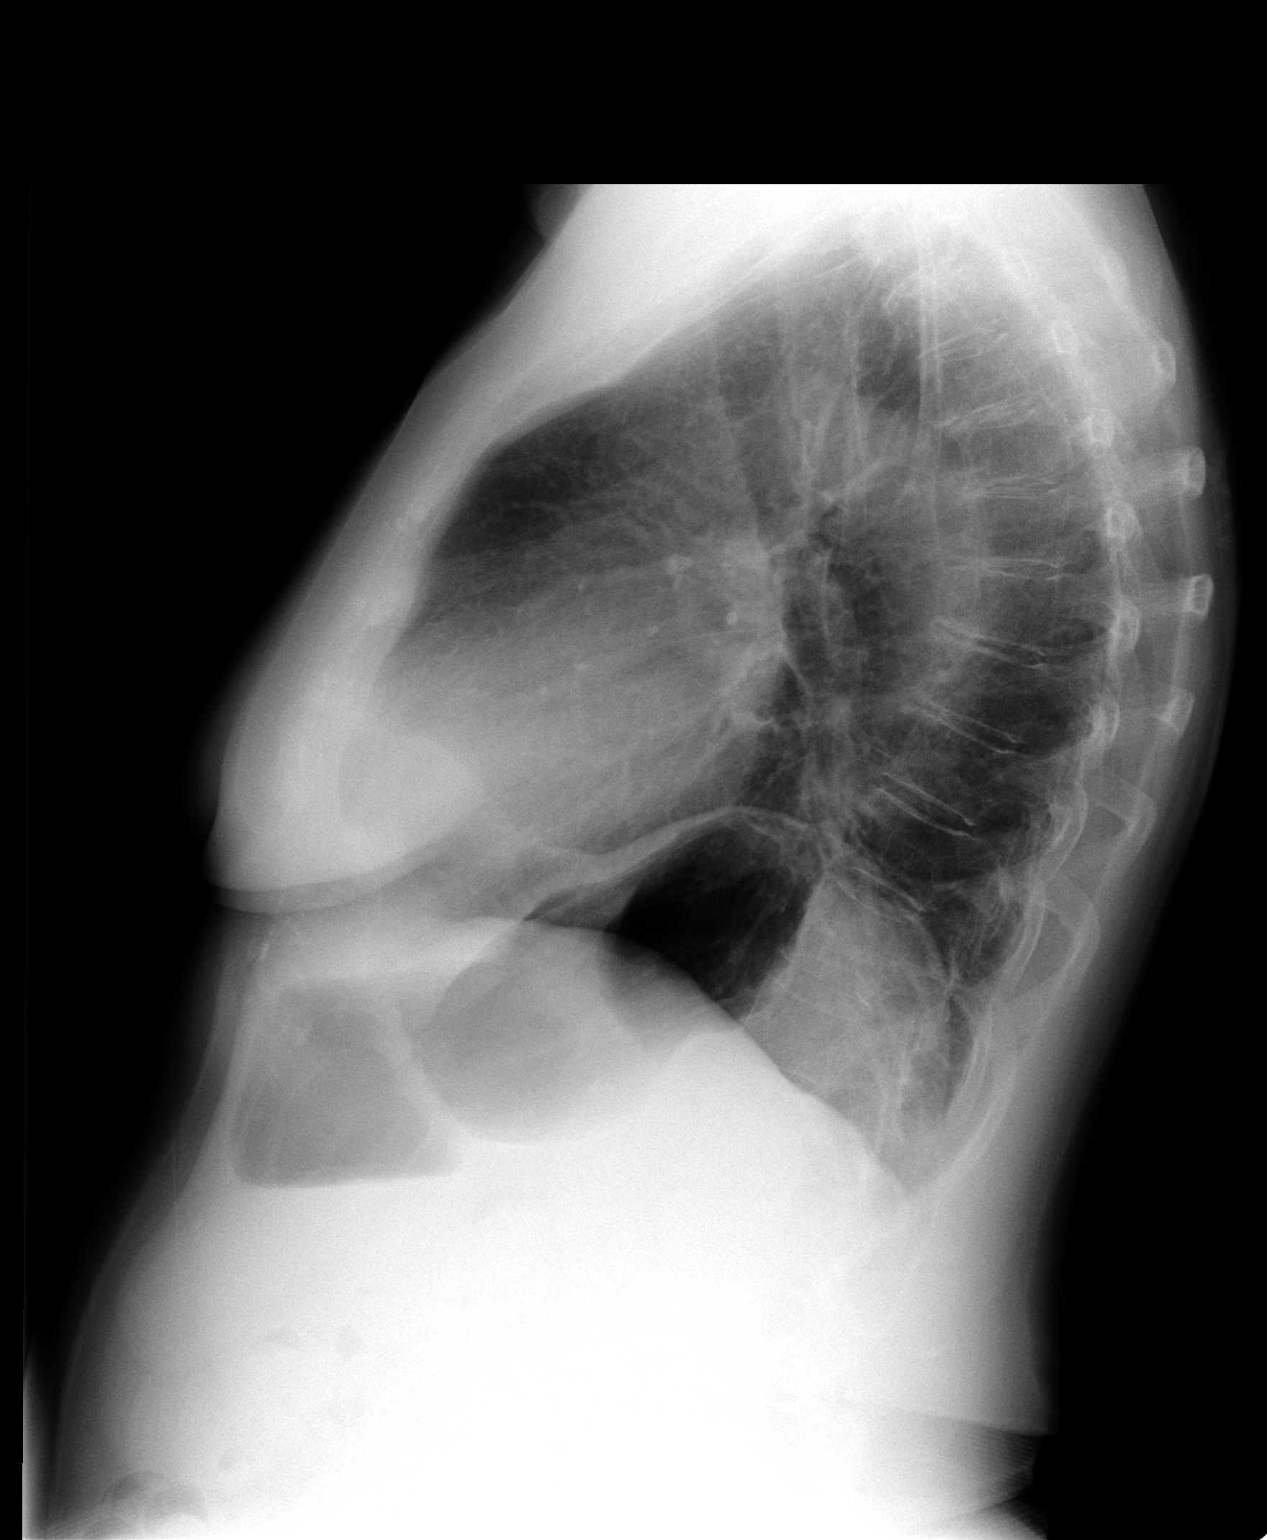

[2 of 2 positions shown; findings below may reference images not displayed]

FINDINGS: The cardiac silhouette, mediastinal and hilar contours
are normal and stable.  The lungs demonstrate mild chronic
bronchitic type changes and mild hyperinflation.  No infiltrates,
edema or effusions.  The bony thorax is intact.  Stable
degenerative changes in the thoracic spine.
IMPRESSION: No acute cardiopulmonary findings.  Mild chronic bronchitic
changes.

## 2014-07-30 IMAGING — CR DG CHEST 1V PORT
1 series · 1 of 1 positions shown · non-contrast
Comparison: the previous day's study

CLINICAL DATA: Coronary bypass grafting, intubated

PORTABLE CHEST - 1 VIEW

[AP]
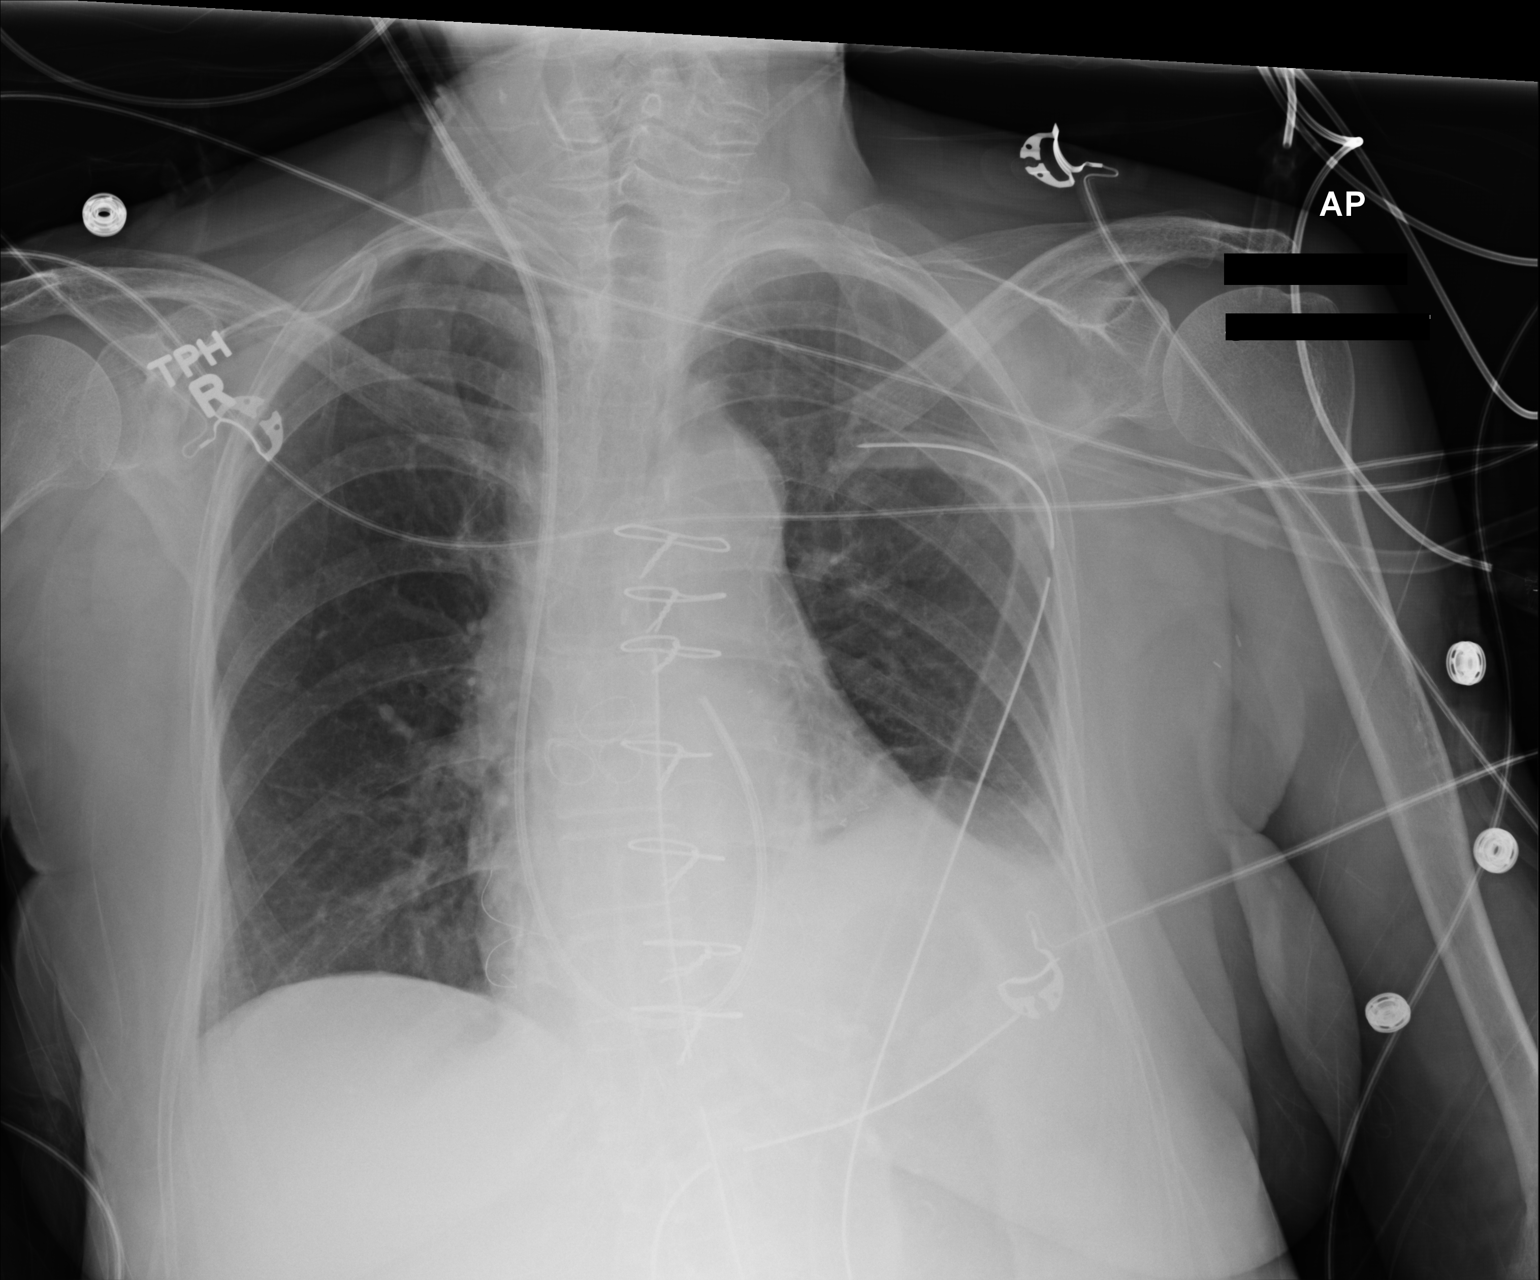

[1 of 1 positions shown; findings below may reference images not displayed]

FINDINGS: The patient has been extubated and the nasogastric tube
removed.  Right IJ Swan-Ganz catheter and left chest tube remain in
place.  No pneumothorax evident.  Mild elevation of the left
diaphragmatic leaflet as before.  There is patchy atelectasis at
the left lung base, stable.  Heart size upper limits normal for
technique.  Previous CABG.
IMPRESSION: 1.  Extubation with otherwise stable appearance since previous exam

## 2014-07-31 IMAGING — CR DG CHEST 1V PORT
1 series · 1 of 1 positions shown · non-contrast
Comparison: Chest radiograph 08/11/2012

CLINICAL DATA: Postop cardiac surgery

PORTABLE CHEST - 1 VIEW

[AP]
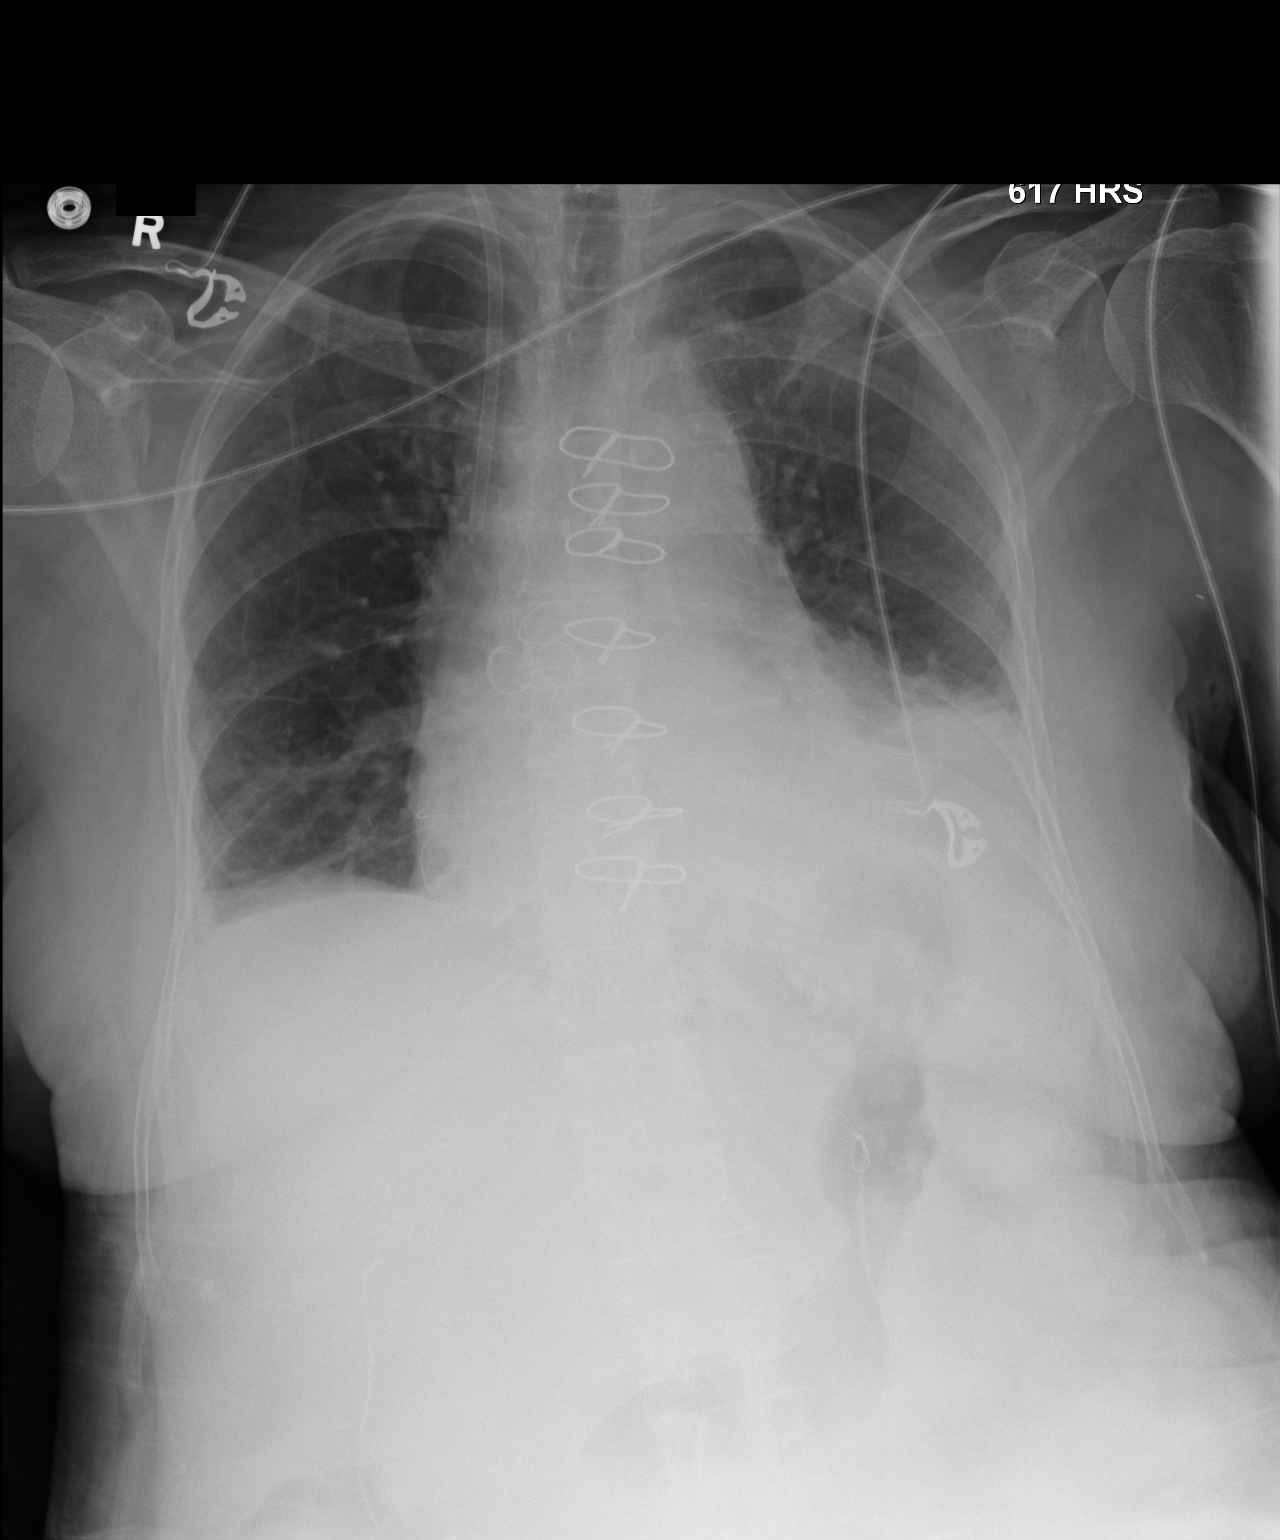

[1 of 1 positions shown; findings below may reference images not displayed]

FINDINGS: Interval retraction of Swan-Ganz catheter and removal of
left chest tube.  No pneumothorax.  There is dense left basilar
atelectasis again demonstrated.  This is in part due to elevation
left hemidiaphragm.  Mediastinal drain has also been removed.
IMPRESSION: 1.  Removal of left chest tube without complication.
2.  Persistent left basilar atelectasis and elevation of the left
hemidiaphragm.

## 2014-08-29 ENCOUNTER — Other Ambulatory Visit: Payer: Self-pay

## 2014-08-29 MED ORDER — METOPROLOL TARTRATE 25 MG PO TABS: 12.5000 mg | ORAL_TABLET | ORAL | Status: DC

## 2014-08-29 NOTE — Telephone Encounter (Signed)
Rx sent to pharmacy   

## 2014-08-30 ENCOUNTER — Other Ambulatory Visit: Payer: Self-pay

## 2014-08-30 MED ORDER — METOPROLOL TARTRATE 25 MG PO TABS: ORAL_TABLET | ORAL | Status: DC

## 2014-09-29 ENCOUNTER — Encounter (HOSPITAL_COMMUNITY): Payer: Self-pay | Admitting: Cardiovascular Disease

## 2014-10-03 ENCOUNTER — Other Ambulatory Visit: Payer: Self-pay

## 2014-10-03 MED ORDER — METOPROLOL TARTRATE 25 MG PO TABS: ORAL_TABLET | ORAL | Status: DC

## 2014-10-03 NOTE — Telephone Encounter (Signed)
Rx sent to pharmacy   

## 2014-11-01 ENCOUNTER — Other Ambulatory Visit: Payer: Self-pay

## 2014-12-16 ENCOUNTER — Ambulatory Visit (INDEPENDENT_AMBULATORY_CARE_PROVIDER_SITE_OTHER): Payer: Medicare Other | Admitting: Emergency Medicine

## 2014-12-16 ENCOUNTER — Ambulatory Visit (INDEPENDENT_AMBULATORY_CARE_PROVIDER_SITE_OTHER): Payer: Medicare Other

## 2014-12-16 ENCOUNTER — Other Ambulatory Visit: Payer: Self-pay | Admitting: Emergency Medicine

## 2014-12-16 VITALS — BP 122/80 | HR 94 | Temp 98.4°F | Resp 16 | Ht 61.75 in | Wt 118.0 lb

## 2014-12-16 DIAGNOSIS — M79641 Pain in right hand: Secondary | ICD-10-CM

## 2014-12-16 DIAGNOSIS — S92251A Displaced fracture of navicular [scaphoid] of right foot, initial encounter for closed fracture: Secondary | ICD-10-CM

## 2014-12-16 NOTE — Progress Notes (Signed)
[facility] [address] [place] [address] [phone]  Date:  12/16/2014   Name:  Diane [name]   DOB:  06-09-1935   MRN:  [id number]  PCP:  Donnajean Lopes, MD    Chief Complaint: Wrist Pain; Head injury; and Leg Pain   History of Present Illness:  Diane [name] is a 80 y.o. very pleasant female patient who presents with the following:  [place] in the house and landed on outstretched arm Has pain, swelling and bruising of the right hand and wrist Denies syncope or close head injury.  No LOC. Denies other complaint or injury No improvement with over the counter medications or other home remedies.  Denies other complaint or health concern today.   Patient Active Problem List   Diagnosis Date Noted  . CKD (chronic kidney disease) stage V requiring chronic dialysis 12/13/2013  . Physical deconditioning 12/13/2013  . Sepsis 12/12/2013  . Influenza A 12/12/2013  . Peripheral neuropathy 08/11/2013  . Cardiovascular stress test abnormal and chest pain with tachycardia 08/20/2012  . Anemia,chronic and acute post op 08/20/2012  . S/P CABG x 5, 08/15/12, LIMA-LAD;VG-diag;VG-OM; seq VG-PDA,PLA  08/18/2012  . CAD (coronary artery disease): Severe three vessel 08/13/2012  . ESRD (end stage renal disease) on dialysis 08/13/2012  . Knee pain, acute, right 08/13/2012    Past Medical History  Diagnosis Date  . Anemia in chronic kidney disease(285.21)   . Bipolar disorder, unspecified   . Iron deficiency anemia, unspecified   . Secondary hyperparathyroidism (of renal origin)   . Restless legs syndrome (RLS)   . Unspecified hypertensive kidney disease with chronic kidney disease stage V or end stage renal disease   . 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 Personal history of colonic polyps   . Diverticulosis of colon (without mention of hemorrhage) 2006    Colonoscopy Dr. Oletta Lamas  .  Coronary artery disease   . Anginal pain   . S/P CABG x 5, 08/15/12, LIMA-LAD;VG-diag;VG-OM; seq VG-PDA,PLA  08/18/2012  . Anxiety   . Depression     Past Surgical History  Procedure Laterality Date  . Arteriovenous graft placement  11/2010    left upper - [facility] Dr. Donnetta Hutching  . Arteriovenous graft placement  12/2009    left lower - [facility] Dr. Donnetta Hutching  . Av fistula placement  07/2009    right upper AVF Dr. Donnetta Hutching  . Av fistula placement  01/2002    right lower Dr. Donnetta Hutching  . Cardiac catheterization  08/12/2012  . Abdominal hysterectomy    . Coronary artery bypass graft  08/14/2012    Procedure: CORONARY ARTERY BYPASS GRAFTING (CABG);  Surgeon: Gaye Pollack, MD;  Location: [facility];  Service: Open Heart Surgery;  Laterality: N/A;  times five, using left internal mammary  . Endovein harvest of greater saphenous vein  08/14/2012    Procedure: ENDOVEIN HARVEST OF GREATER SAPHENOUS VEIN;  Surgeon: Gaye Pollack, MD;  Location: [facility];  Service: Open Heart Surgery;  Laterality: Bilateral;  . Eye surgery    . Left and right heart catheterization with coronary/graft angiogram  08/12/2012    Procedure: LEFT AND RIGHT HEART CATHETERIZATION WITH Beatrix Fetters;  Surgeon: Troy Sine, MD;  Location: [facility] CATH [facility];  Service: Cardiovascular;;    History  Substance Use Topics  . Smoking status: Never Smoker   . Smokeless tobacco: Never Used  . Alcohol  Use: No    History reviewed. No pertinent family history.  Allergies  Allergen Reactions  . Ambien [Zolpidem Tartrate]     Stays awake  . Clavulanic Acid   . Sulfa Antibiotics Other (See Comments)    Migraine   . Latex Rash  . Tape Rash    Can only use paper tape     Medication list has been reviewed and updated.  Current Outpatient Prescriptions on File Prior to Visit  Medication Sig Dispense Refill  . aspirin 81 MG tablet Take 81 mg by mouth daily. Take on days she doesn't have dialysis    . b complex-vitamin c-folic acid  (NEPHRO-VITE) 0.8 MG TABS Take 0.8 mg by mouth daily.    . calcium acetate (PHOSLO) 667 MG capsule Take 667 mg by mouth 2 (two) times daily.     . clorazepate (TRANXENE) 3.75 MG tablet Take 3.75 mg by mouth 2 (two) times daily as needed for anxiety.    . gabapentin (NEURONTIN) 100 MG capsule Take 100 mg by mouth 2 (two) times daily.     Marland Kitchen HYDROcodone-acetaminophen (NORCO/VICODIN) 5-325 MG per tablet Take 1 tablet by mouth every 6 (six) hours as needed for moderate pain.    . hydrOXYzine (ATARAX/VISTARIL) 50 MG tablet Take 50 mg by mouth at bedtime.     Marland Kitchen Ketotifen Fumarate (ALAWAY OP) Place 1 drop into both eyes 2 (two) times daily as needed. For allergies    . metoprolol tartrate (LOPRESSOR) 25 MG tablet Take 0.5 (12.5 mg total) twice daily on non-dialysis days and take 1 tablet (25 mg total) at bedtime on dialysis days. 30 tablet 0  . mometasone (ELOCON) 0.1 % cream Apply 1 application topically as needed (for itching from tape).     Marland Kitchen NITROSTAT 0.4 MG SL tablet Place 0.4 mg under the tongue every 5 (five) minutes as needed.     . polyethylene glycol (MIRALAX / GLYCOLAX) packet Take 17 g by mouth daily as needed. For constipation    . ALPRAZolam (XANAX) 0.25 MG tablet Take 1 tablet (0.25 mg total) by mouth 3 (three) times daily as needed for anxiety. (Patient not taking: Reported on 12/16/2014) 10 tablet 0  . amoxicillin-clavulanate (AUGMENTIN) 875-125 MG per tablet Take 1 tablet by mouth 2 (two) times daily. (Patient not taking: Reported on 12/16/2014) 20 tablet 0  . ciprofloxacin (CIPRO) 250 MG tablet Take 1 tablet (250 mg total) by mouth 2 (two) times daily. (Patient not taking: Reported on 12/16/2014) 20 tablet 0   No current facility-administered medications on file prior to visit.    Review of Systems:  As per HPI, otherwise negative.    Physical Examination: Filed Vitals:   12/16/14 1823  BP: 122/80  Pulse: 94  Temp: 98.4 F (36.9 C)  Resp: 16   Filed Vitals:   12/16/14 1823   Height: 5' 1.75" (1.568 m)  Weight: 118 lb (53.524 kg)   Body mass index is 21.77 kg/(m^2). Ideal Body Weight: Weight in (lb) to have BMI = 25: 135.3  GEN: WDWN, NAD, Non-toxic, A & O x 3 HEENT: Atraumatic, Normocephalic. Neck supple. No masses, No LAD. Ears and Nose: No external deformity. CV: RRR, No M/G/R. No JVD. No thrill. No extra heart sounds. PULM: CTA B, no wheezes, crackles, rhonchi. No retractions. No resp. distress. No accessory muscle use. ABD: S, NT, ND, +BS. No rebound. No HSM. EXTR: No c/c/e  Right hand dorsal swelling and ecchymosis NEURO Normal gait.  PSYCH: Normally interactive. Conversant.  Not depressed or anxious appearing.  Calm demeanor.    Assessment and Plan: Possible navicular fracture Thumb spica Follow up in one week  Signed,  Ellison Carwin, MD   [facility] reading (PRIMARY) by  Dr. Ouida Sills.  Possible navicular fracture.

## 2014-12-16 NOTE — Patient Instructions (Signed)
Scaphoid Fracture, Wrist A fracture is a break in the bone. The bone you have broken often does not show up as a fracture on x-ray until later on in the healing phase. This bone is called the scaphoid bone. With this bone, your caregiver will often cast or splint your wrist as though it is fractured, even if a fracture is not seen on the x-ray. This is often done with wrist injuries in which there is tenderness at the base of the thumb. An x-ray at 1-3 weeks after your injury may confirm this fracture. A cast or splint is used to protect and keep your injured bone in good position for healing. The cast or splint will be on generally for about 6 to 16 weeks, depending on your health, age, the fracture location and how quickly you heal. Another name for the scaphoid bone is the navicular bone. HOME CARE INSTRUCTIONS   To lessen the swelling and pain, keep the injured part elevated above your heart while sitting or lying down.  Apply ice to the injury for 15-20 minutes, 03-04 times per day while awake, for 2 days. Put the ice in a plastic bag and place a thin towel between the bag of ice and your cast.  If you have a plaster or fiberglass cast or splint:  Do not try to scratch the skin under the cast using sharp or pointed objects.  Check the skin around the cast every day. You may put lotion on any red or sore areas.  Keep your cast or splint dry and clean.  If you have a plaster splint:  Wear the splint as directed.  You may loosen the elastic bandage around the splint if your fingers become numb, tingle, or turn cold or blue.  If you have been put in a removable splint, wear and use as directed.  Do not put pressure on any part of your cast or splint; it may deform or break. Rest your cast or splint only on a pillow the first 24 hours until it is fully hardened.  Your cast or splint can be protected during bathing with a plastic bag. Do not lower the cast or splint into water.  Only take  over-the-counter or prescription medicines for pain, discomfort, or fever as directed by your caregiver.  If your caregiver has given you a follow up appointment, it is very important to keep that appointment. Not keeping the appointment could result in chronic pain and decreased function. If there is any problem keeping the appointment, you must call back to this facility for assistance. SEEK IMMEDIATE MEDICAL CARE IF:   Your cast gets damaged, wet or breaks.  You have continued severe pain or more swelling than you did before the cast or splint was put on.  Your skin or nails below the injury turn blue or gray, or feel cold or numb.  You have tingling or burning pain in your fingers or increasing pain with movement of your fingers Document Released: 09/27/2002 Document Revised: 12/30/2011 Document Reviewed: 05/26/2009 [facility] Patient Information 2015 [name], Diane. This information is not intended to replace advice given to you by your health care provider. Make sure you discuss any questions you have with your health care provider.

## 2014-12-18 ENCOUNTER — Other Ambulatory Visit: Payer: Self-pay | Admitting: Emergency Medicine

## 2014-12-18 DIAGNOSIS — S52532A Colles' fracture of left radius, initial encounter for closed fracture: Secondary | ICD-10-CM

## 2014-12-23 ENCOUNTER — Ambulatory Visit: Payer: Medicare Other | Admitting: Family Medicine

## 2015-02-23 ENCOUNTER — Encounter: Payer: Self-pay | Admitting: Vascular Surgery

## 2015-02-24 ENCOUNTER — Other Ambulatory Visit: Payer: Self-pay

## 2015-02-24 ENCOUNTER — Encounter: Payer: Self-pay | Admitting: Vascular Surgery

## 2015-02-24 ENCOUNTER — Ambulatory Visit (INDEPENDENT_AMBULATORY_CARE_PROVIDER_SITE_OTHER): Payer: Medicare Other | Admitting: Vascular Surgery

## 2015-02-24 VITALS — BP 109/70 | HR 101 | Ht 61.0 in | Wt 120.4 lb

## 2015-02-24 DIAGNOSIS — N186 End stage renal disease: Secondary | ICD-10-CM | POA: Diagnosis not present

## 2015-02-24 DIAGNOSIS — Z992 Dependence on renal dialysis: Secondary | ICD-10-CM | POA: Diagnosis not present

## 2015-02-24 NOTE — Progress Notes (Signed)
I was unable to reach patient by phone.  I left  A message on voice mail.  I instructed the patient to arrive at [facility] entrance at -- 0700  , nothing to eat or drink after midnight.   I instructed the patient to take the following medications in the am with just enough water to get them down:Aspirin, Gabapentin, Metoprolol; if needed Trazene amd Vicodin.  I asked patient to not wear any lotions, powders, cologne, jewelry, piercing, make-up or nail polish.  I asked the patient to call [phone], in the am if there were any questions or problems.

## 2015-02-24 NOTE — Progress Notes (Signed)
I notified Dr Kalman Shan that I can not find any notes from cardiologist since 08/13/2013, was due to see Dr Claiborne Billings April 2015.  CABG was done 07/2012. No new orders.

## 2015-02-24 NOTE — Progress Notes (Signed)
Established Dialysis Access  History of Present Illness  Diane [name] is a 80 y.o. (1934/12/31) female who presents for bleeding from Left UA AVG.  The patient has a scab over her left UA for the last month.  She had perfuse bleeding when it was displaced previously.  This LUA AVG was placed on 11/30/10.  The patient denies any steal sx from the LUA AVG.  Past Medical History  Diagnosis Date  . Anemia in chronic kidney disease(285.21)   . Bipolar disorder, unspecified   . Iron deficiency anemia, unspecified   . Secondary hyperparathyroidism (of renal origin)   . Restless legs syndrome (RLS)   . Unspecified hypertensive kidney disease with chronic kidney disease stage V or end stage renal disease   . 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 Personal history of colonic polyps   . Diverticulosis of colon (without mention of hemorrhage) 2006    Colonoscopy Dr. Oletta Lamas  . Coronary artery disease   . Anginal pain   . S/P CABG x 5, 08/15/12, LIMA-LAD;VG-diag;VG-OM; seq VG-PDA,PLA  08/18/2012  . Anxiety   . Depression     Past Surgical History  Procedure Laterality Date  . Arteriovenous graft placement  11/2010    left upper - AVGG Dr. Donnetta Hutching  . Arteriovenous graft placement  12/2009    left lower - AVGG Dr. Donnetta Hutching  . Av fistula placement  07/2009    right upper AVF Dr. Donnetta Hutching  . Av fistula placement  01/2002    right lower Dr. Donnetta Hutching  . Cardiac catheterization  08/12/2012  . Abdominal hysterectomy    . Coronary artery bypass graft  08/14/2012    Procedure: CORONARY ARTERY BYPASS GRAFTING (CABG);  Surgeon: Gaye Pollack, MD;  Location: [place];  Service: Open Heart Surgery;  Laterality: N/A;  times five, using left internal mammary  . Endovein harvest of greater saphenous vein  08/14/2012    Procedure: ENDOVEIN HARVEST OF GREATER SAPHENOUS VEIN;  Surgeon: Gaye Pollack, MD;  Location: [place];  Service: Open Heart Surgery;   Laterality: Bilateral;  . Eye surgery    . Left and right heart catheterization with coronary/graft angiogram  08/12/2012    Procedure: LEFT AND RIGHT HEART CATHETERIZATION WITH Beatrix Fetters;  Surgeon: Troy Sine, MD;  Location: [facility] CATH LAB;  Service: Cardiovascular;;    History   Social History  . Marital Status: Married    Spouse Name: N/A  . Number of Children: N/A  . Years of Education: N/A   Occupational History  . Not on file.   Social History Main Topics  . Smoking status: Never Smoker   . Smokeless tobacco: Never Used  . Alcohol Use: No  . Drug Use: No  . Sexual Activity: No   Other Topics Concern  . Not on file   Social History Narrative    History reviewed. No pertinent family history.   Current Outpatient Prescriptions on File Prior to Visit  Medication Sig Dispense Refill  . amoxicillin-clavulanate (AUGMENTIN) 875-125 MG per tablet Take 1 tablet by mouth 2 (two) times daily. 20 tablet 0  . aspirin 81 MG tablet Take 81 mg by mouth daily. Take on days she doesn't have dialysis    . b complex-vitamin c-folic acid (NEPHRO-VITE) 0.8 MG TABS Take 0.8 mg by mouth daily.    . calcium acetate (PHOSLO) 667 MG capsule Take 667 mg by mouth 2 (  two) times daily.     . clorazepate (TRANXENE) 3.75 MG tablet Take 3.75 mg by mouth 2 (two) times daily as needed for anxiety.    . gabapentin (NEURONTIN) 100 MG capsule Take 100 mg by mouth 2 (two) times daily.     Marland Kitchen HYDROcodone-acetaminophen (NORCO/VICODIN) 5-325 MG per tablet Take 1 tablet by mouth every 6 (six) hours as needed for moderate pain.    . hydrOXYzine (ATARAX/VISTARIL) 50 MG tablet Take 50 mg by mouth at bedtime.     Marland Kitchen Ketotifen Fumarate (ALAWAY OP) Place 1 drop into both eyes 2 (two) times daily as needed. For allergies    . metoprolol tartrate (LOPRESSOR) 25 MG tablet Take 0.5 (12.5 mg total) twice daily on non-dialysis days and take 1 tablet (25 mg total) at bedtime on dialysis days. 30 tablet 0  .  mometasone (ELOCON) 0.1 % cream Apply 1 application topically as needed (for itching from tape).     Marland Kitchen NITROSTAT 0.4 MG SL tablet Place 0.4 mg under the tongue every 5 (five) minutes as needed.     . polyethylene glycol (MIRALAX / GLYCOLAX) packet Take 17 g by mouth daily as needed. For constipation    . ALPRAZolam (XANAX) 0.25 MG tablet Take 1 tablet (0.25 mg total) by mouth 3 (three) times daily as needed for anxiety. (Patient not taking: Reported on 12/16/2014) 10 tablet 0  . ciprofloxacin (CIPRO) 250 MG tablet Take 1 tablet (250 mg total) by mouth 2 (two) times daily. (Patient not taking: Reported on 12/16/2014) 20 tablet 0   No current facility-administered medications on file prior to visit.    Allergies  Allergen Reactions  . Ambien [Zolpidem Tartrate]     Stays awake  . Clavulanic Acid   . Sulfa Antibiotics Other (See Comments)    Migraine   . Latex Rash  . Tape Rash    Can only use paper tape     REVIEW OF SYSTEMS:  (Positives checked otherwise negative)  CARDIOVASCULAR:  []  chest pain, []  chest pressure, []  palpitations, []  shortness of breath when laying flat, []  shortness of breath with exertion,  []  pain in feet when walking, []  pain in feet when laying flat, []  history of blood clot in veins (DVT), []  history of phlebitis, []  swelling in legs, []  varicose veins  PULMONARY:  []  productive cough, []  asthma, []  wheezing  NEUROLOGIC:  []  weakness in arms or legs, []  numbness in arms or legs, []  difficulty speaking or slurred speech, []  temporary loss of vision in one eye, []  dizziness  HEMATOLOGIC:  []  bleeding problems, []  problems with blood clotting too easily  MUSCULOSKEL:  []  joint pain, []  joint swelling  GASTROINTEST:  []  vomiting blood, []  blood in stool     GENITOURINARY:  []  burning with urination, []  blood in urine, [x]  ESRD-HD: T-R-S  PSYCHIATRIC:  []  history of major depression  INTEGUMENTARY:  []  rashes, []  ulcers   Physical Examination  Filed Vitals:    02/24/15 1423  BP: 109/70  Pulse: 101  Height: 5\' 1"  (1.549 m)  Weight: 120 lb 6.4 oz (54.613 kg)  SpO2: 97%   Body mass index is 22.76 kg/(m^2).  General: A&O x 3, WD, Cachectic   Pulmonary: Sym exp, good air movt, CTAB, no rales, rhonchi, & wheezing  Cardiac: RRR, Nl S1, S2, no Murmurs, rubs or gallops  Vascular: Vessel Right Left  Radial Palpable Palpable  Ulnar Not Palpable Not Palpable  Brachial Palpable Palpable   Musculoskeletal: M/S  5/5 throughout , Extremities without  ischemic changes , + palpable thrill in access in LUA AVG, + bruit in access, 0.5 cm scab overlying proximal upper 1/3 of LUA AVG, 1-2 cm PSA overlying distal 1/3 of LUA AVG  Neurologic: Pain and light touch intact in extremities , Motor exam as listed above   Medical Decision Making  Diane [name] is a 80 y.o. female who presents with skin ulcer overlying LUA AVG, ESRD requiring hemodialysis.    Pt aware to avoid disturbing the scab in the LUA AVG.  Per the patient, the patient this LUA AVG has had this scab over the last month so I doubt spontaneous bleeding will occur or it have already.  The previous bleeding episode came with manipulation of the scab.  The patient is schedule for excision of LUA AVG ulcer vs revision of LUA AVG with Dr. Donnetta Hutching.  Risk, benefits, and alternatives to access surgery were discussed.  The patient is aware the risks include but are not limited to: bleeding, infection, steal syndrome, nerve damage, ischemic monomelic neuropathy, failure to mature, need for additional procedures, death and stroke.    The patient agrees to proceed forward with the procedure.  Adele Barthel, MD [facility] Office: [phone] Pager: [phone]  02/24/2015, 2:41 PM

## 2015-02-25 NOTE — Progress Notes (Signed)
Patient states she does not have anyone to stay with her overnight. Ask her to notify nurse when she comes into hospital. She reports that she is no longer on Metoprolol request that she bring a current medication list.

## 2015-02-26 MED ORDER — VANCOMYCIN HCL IN DEXTROSE 1-5 GM/200ML-% IV SOLN
1000.0000 mg | INTRAVENOUS | Status: AC
  Administered 2015-02-27: 1000 mg via INTRAVENOUS
  Filled 2015-02-26: qty 200

## 2015-02-27 ENCOUNTER — Encounter (HOSPITAL_COMMUNITY): Payer: Self-pay | Admitting: Critical Care Medicine

## 2015-02-27 ENCOUNTER — Ambulatory Visit (HOSPITAL_COMMUNITY): Payer: Medicare Other | Admitting: Critical Care Medicine

## 2015-02-27 ENCOUNTER — Ambulatory Visit (HOSPITAL_COMMUNITY)
Admission: RE | Admit: 2015-02-27 | Discharge: 2015-02-27 | Disposition: A | Discharge status: Home / Self Care | Payer: Medicare Other | Source: Ambulatory Visit | Attending: Vascular Surgery | Admitting: Vascular Surgery

## 2015-02-27 ENCOUNTER — Encounter (HOSPITAL_COMMUNITY)
Admission: RE | Disposition: A | Discharge status: Home / Self Care | Payer: Self-pay | Source: Ambulatory Visit | Attending: Vascular Surgery

## 2015-02-27 DIAGNOSIS — Z792 Long term (current) use of antibiotics: Secondary | ICD-10-CM | POA: Insufficient documentation

## 2015-02-27 DIAGNOSIS — I509 Heart failure, unspecified: Secondary | ICD-10-CM | POA: Diagnosis not present

## 2015-02-27 DIAGNOSIS — F319 Bipolar disorder, unspecified: Secondary | ICD-10-CM | POA: Diagnosis not present

## 2015-02-27 DIAGNOSIS — Z79891 Long term (current) use of opiate analgesic: Secondary | ICD-10-CM | POA: Insufficient documentation

## 2015-02-27 DIAGNOSIS — L98499 Non-pressure chronic ulcer of skin of other sites with unspecified severity: Secondary | ICD-10-CM | POA: Insufficient documentation

## 2015-02-27 DIAGNOSIS — Y832 Surgical operation with anastomosis, bypass or graft as the cause of abnormal reaction of the patient, or of later complication, without mention of misadventure at the time of the procedure: Secondary | ICD-10-CM | POA: Diagnosis not present

## 2015-02-27 DIAGNOSIS — R9431 Abnormal electrocardiogram [ECG] [EKG]: Secondary | ICD-10-CM | POA: Insufficient documentation

## 2015-02-27 DIAGNOSIS — Y929 Unspecified place or not applicable: Secondary | ICD-10-CM | POA: Insufficient documentation

## 2015-02-27 DIAGNOSIS — N186 End stage renal disease: Secondary | ICD-10-CM | POA: Diagnosis not present

## 2015-02-27 DIAGNOSIS — G2581 Restless legs syndrome: Secondary | ICD-10-CM | POA: Insufficient documentation

## 2015-02-27 DIAGNOSIS — I120 Hypertensive chronic kidney disease with stage 5 chronic kidney disease or end stage renal disease: Secondary | ICD-10-CM | POA: Insufficient documentation

## 2015-02-27 DIAGNOSIS — I2510 Atherosclerotic heart disease of native coronary artery without angina pectoris: Secondary | ICD-10-CM | POA: Insufficient documentation

## 2015-02-27 DIAGNOSIS — N2581 Secondary hyperparathyroidism of renal origin: Secondary | ICD-10-CM | POA: Insufficient documentation

## 2015-02-27 DIAGNOSIS — T82898A Other specified complication of vascular prosthetic devices, implants and grafts, initial encounter: Secondary | ICD-10-CM | POA: Diagnosis not present

## 2015-02-27 DIAGNOSIS — Z7982 Long term (current) use of aspirin: Secondary | ICD-10-CM | POA: Diagnosis not present

## 2015-02-27 DIAGNOSIS — Z79899 Other long term (current) drug therapy: Secondary | ICD-10-CM | POA: Insufficient documentation

## 2015-02-27 DIAGNOSIS — T82868A Thrombosis of vascular prosthetic devices, implants and grafts, initial encounter: Secondary | ICD-10-CM | POA: Insufficient documentation

## 2015-02-27 DIAGNOSIS — F419 Anxiety disorder, unspecified: Secondary | ICD-10-CM | POA: Diagnosis not present

## 2015-02-27 HISTORY — PX: REVISION OF ARTERIOVENOUS GORETEX GRAFT: SHX6073

## 2015-02-27 LAB — POCT I-STAT 4, (NA,K, GLUC, HGB,HCT)
Glucose, Bld: 130 mg/dL — ABNORMAL HIGH (ref 70–99)
HCT: 35 % — ABNORMAL LOW (ref 36.0–46.0)
Hemoglobin: 11.9 g/dL — ABNORMAL LOW (ref 12.0–15.0)
Potassium: 3.8 mmol/L (ref 3.5–5.1)
SODIUM: 142 mmol/L (ref 135–145)

## 2015-02-27 SURGERY — REVISION OF ARTERIOVENOUS GORETEX GRAFT
Anesthesia: General | Site: Arm Upper | Laterality: Left

## 2015-02-27 MED ORDER — DEXAMETHASONE SODIUM PHOSPHATE 4 MG/ML IJ SOLN
INTRAMUSCULAR | Status: DC | PRN
  Administered 2015-02-27: 4 mg via INTRAVENOUS

## 2015-02-27 MED ORDER — PHENYLEPHRINE 40 MCG/ML (10ML) SYRINGE FOR IV PUSH (FOR BLOOD PRESSURE SUPPORT)
PREFILLED_SYRINGE | INTRAVENOUS | Status: AC
  Filled 2015-02-27: qty 10

## 2015-02-27 MED ORDER — LIDOCAINE-EPINEPHRINE 0.5 %-1:200000 IJ SOLN
INTRAMUSCULAR | Status: AC
  Filled 2015-02-27: qty 1

## 2015-02-27 MED ORDER — LIDOCAINE-EPINEPHRINE 0.5 %-1:200000 IJ SOLN
INTRAMUSCULAR | Status: DC | PRN
  Administered 2015-02-27: 6 mL

## 2015-02-27 MED ORDER — 0.9 % SODIUM CHLORIDE (POUR BTL) OPTIME
TOPICAL | Status: DC | PRN
  Administered 2015-02-27: 1000 mL

## 2015-02-27 MED ORDER — OXYCODONE-ACETAMINOPHEN 5-325 MG PO TABS: 1.0000 | ORAL_TABLET | Freq: Four times a day (QID) | ORAL | Status: DC | PRN

## 2015-02-27 MED ORDER — PHENYLEPHRINE HCL 10 MG/ML IJ SOLN
10.0000 mg | INTRAVENOUS | Status: DC | PRN
  Administered 2015-02-27: 25 ug/min via INTRAVENOUS

## 2015-02-27 MED ORDER — DEXAMETHASONE SODIUM PHOSPHATE 4 MG/ML IJ SOLN
INTRAMUSCULAR | Status: AC
  Filled 2015-02-27: qty 1

## 2015-02-27 MED ORDER — OXYCODONE HCL 5 MG/5ML PO SOLN: 5.0000 mg | Freq: Once | ORAL | Status: DC | PRN

## 2015-02-27 MED ORDER — ONDANSETRON HCL 4 MG/2ML IJ SOLN
INTRAMUSCULAR | Status: AC
  Filled 2015-02-27: qty 2

## 2015-02-27 MED ORDER — MIDAZOLAM HCL 2 MG/2ML IJ SOLN
INTRAMUSCULAR | Status: AC
  Filled 2015-02-27: qty 2

## 2015-02-27 MED ORDER — SODIUM CHLORIDE 0.9 % IR SOLN
Status: DC | PRN
  Administered 2015-02-27: 500 mL

## 2015-02-27 MED ORDER — FENTANYL CITRATE (PF) 100 MCG/2ML IJ SOLN: 25.0000 ug | INTRAMUSCULAR | Status: DC | PRN

## 2015-02-27 MED ORDER — OXYCODONE HCL 5 MG PO TABS: 5.0000 mg | ORAL_TABLET | Freq: Once | ORAL | Status: DC | PRN

## 2015-02-27 MED ORDER — FENTANYL CITRATE (PF) 250 MCG/5ML IJ SOLN
INTRAMUSCULAR | Status: AC
  Filled 2015-02-27: qty 5

## 2015-02-27 MED ORDER — SODIUM CHLORIDE 0.9 % IV SOLN: INTRAVENOUS | Status: DC

## 2015-02-27 MED ORDER — MIDAZOLAM HCL 5 MG/5ML IJ SOLN
INTRAMUSCULAR | Status: DC | PRN
  Administered 2015-02-27: 2 mg via INTRAVENOUS

## 2015-02-27 MED ORDER — ACETAMINOPHEN 325 MG PO TABS: 325.0000 mg | ORAL_TABLET | ORAL | Status: DC | PRN

## 2015-02-27 MED ORDER — CHLORHEXIDINE GLUCONATE CLOTH 2 % EX PADS: 6.0000 | MEDICATED_PAD | Freq: Once | CUTANEOUS | Status: DC

## 2015-02-27 MED ORDER — ACETAMINOPHEN 160 MG/5ML PO SOLN
325.0000 mg | ORAL | Status: DC | PRN
  Filled 2015-02-27: qty 20.3

## 2015-02-27 MED ORDER — ONDANSETRON HCL 4 MG/2ML IJ SOLN
INTRAMUSCULAR | Status: DC | PRN
  Administered 2015-02-27: 4 mg via INTRAVENOUS

## 2015-02-27 MED ORDER — FENTANYL CITRATE (PF) 100 MCG/2ML IJ SOLN
INTRAMUSCULAR | Status: DC | PRN
  Administered 2015-02-27: 25 ug via INTRAVENOUS
  Administered 2015-02-27: 50 ug via INTRAVENOUS

## 2015-02-27 MED ORDER — PHENYLEPHRINE HCL 10 MG/ML IJ SOLN
INTRAMUSCULAR | Status: DC | PRN
  Administered 2015-02-27: 80 ug via INTRAVENOUS

## 2015-02-27 MED ORDER — SODIUM CHLORIDE 0.9 % IV SOLN
INTRAVENOUS | Status: DC
  Administered 2015-02-27: 08:00:00 via INTRAVENOUS

## 2015-02-27 MED ORDER — PROPOFOL INFUSION 10 MG/ML OPTIME
INTRAVENOUS | Status: DC | PRN
  Administered 2015-02-27: 50 ug/kg/min via INTRAVENOUS

## 2015-02-27 SURGICAL SUPPLY — 37 items
APL SKNCLS STERI-STRIP NONHPOA (GAUZE/BANDAGES/DRESSINGS) ×1
BENZOIN TINCTURE PRP APPL 2/3 (GAUZE/BANDAGES/DRESSINGS) ×3 IMPLANT
BNDG GAUZE ELAST 4 BULKY (GAUZE/BANDAGES/DRESSINGS) ×2 IMPLANT
CANISTER SUCTION 2500CC (MISCELLANEOUS) ×3 IMPLANT
CANNULA VESSEL 3MM 2 BLNT TIP (CANNULA) IMPLANT
CATH EMB 4FR 40CM (CATHETERS) ×2 IMPLANT
CLIP LIGATING EXTRA MED SLVR (CLIP) ×3 IMPLANT
CLIP LIGATING EXTRA SM BLUE (MISCELLANEOUS) ×3 IMPLANT
CLOSURE WOUND 1/2 X4 (GAUZE/BANDAGES/DRESSINGS) ×1
DECANTER SPIKE VIAL GLASS SM (MISCELLANEOUS) ×3 IMPLANT
ELECT REM PT RETURN 9FT ADLT (ELECTROSURGICAL) ×3
ELECTRODE REM PT RTRN 9FT ADLT (ELECTROSURGICAL) ×1 IMPLANT
GAUZE SPONGE 4X4 12PLY STRL (GAUZE/BANDAGES/DRESSINGS) ×3 IMPLANT
GEL ULTRASOUND 20GR AQUASONIC (MISCELLANEOUS) IMPLANT
GLOVE SS BIOGEL STRL SZ 7.5 (GLOVE) ×1 IMPLANT
GLOVE SUPERSENSE BIOGEL SZ 7.5 (GLOVE) ×2
GOWN STRL REUS W/ TWL LRG LVL3 (GOWN DISPOSABLE) ×3 IMPLANT
GOWN STRL REUS W/TWL LRG LVL3 (GOWN DISPOSABLE) ×9
GRAFT GORETEX 6X10 (Vascular Products) ×2 IMPLANT
KIT BASIN OR (CUSTOM PROCEDURE TRAY) ×3 IMPLANT
KIT ROOM TURNOVER OR (KITS) ×3 IMPLANT
NDL HYPO 25GX1X1/2 BEV (NEEDLE) ×1 IMPLANT
NEEDLE HYPO 25GX1X1/2 BEV (NEEDLE) ×3 IMPLANT
NS IRRIG 1000ML POUR BTL (IV SOLUTION) ×3 IMPLANT
PACK CV ACCESS (CUSTOM PROCEDURE TRAY) ×3 IMPLANT
PAD ARMBOARD 7.5X6 YLW CONV (MISCELLANEOUS) ×6 IMPLANT
SPONGE GAUZE 4X4 12PLY STER LF (GAUZE/BANDAGES/DRESSINGS) ×2 IMPLANT
STRIP CLOSURE SKIN 1/2X4 (GAUZE/BANDAGES/DRESSINGS) ×2 IMPLANT
SUT ETHILON 3 0 PS 1 (SUTURE) ×2 IMPLANT
SUT PROLENE 6 0 CC (SUTURE) ×6 IMPLANT
SUT SILK 2 0 FS (SUTURE) IMPLANT
SUT VIC AB 3-0 SH 27 (SUTURE) ×6
SUT VIC AB 3-0 SH 27X BRD (SUTURE) ×2 IMPLANT
SYR 3ML LL SCALE MARK (SYRINGE) ×2 IMPLANT
TAPE CLOTH SURG 4X10 WHT LF (GAUZE/BANDAGES/DRESSINGS) ×2 IMPLANT
UNDERPAD 30X30 INCONTINENT (UNDERPADS AND DIAPERS) ×3 IMPLANT
WATER STERILE IRR 1000ML POUR (IV SOLUTION) ×3 IMPLANT

## 2015-02-27 NOTE — Progress Notes (Signed)
Dialysis access report faxed to [facility]. 

## 2015-02-27 NOTE — Progress Notes (Signed)
Patient states no one is able to stay overnight with her. Her son will stay until 2200 tonight. Called Dr. Jillyn Hidden and she is okay with the patient's arrangements.

## 2015-02-27 NOTE — Anesthesia Preprocedure Evaluation (Addendum)
Anesthesia Evaluation  Patient identified by MRN, date of birth, ID band Patient awake    Reviewed: Allergy & Precautions, NPO status , Patient's Chart, lab work & pertinent test results, reviewed documented beta blocker date and time   History of Anesthesia Complications Negative for: history of anesthetic complications  Airway Mallampati: II  TM Distance: >3 FB Neck ROM: Full    Dental  (+) Dental Advisory Given, Upper Dentures, Lower Dentures   Pulmonary neg pulmonary ROS,  breath sounds clear to auscultation        Cardiovascular hypertension, Pt. on medications and Pt. on home beta blockers + angina + CAD and + CABG - CHF - dysrhythmias - Valvular Problems/MurmursRhythm:Regular     Neuro/Psych PSYCHIATRIC DISORDERS Anxiety Depression Bipolar Disorder  Neuromuscular disease    GI/Hepatic negative GI ROS, Neg liver ROS,   Endo/Other  negative endocrine ROS  Renal/GU ESRFRenal disease     Musculoskeletal   Abdominal   Peds  Hematology  (+) anemia ,   Anesthesia Other Findings   Reproductive/Obstetrics                            Anesthesia Physical Anesthesia Plan  ASA: III  Anesthesia Plan: MAC   Post-op Pain Management:    Induction: Intravenous  Airway Management Planned: Natural Airway  Additional Equipment: None  Intra-op Plan:   Post-operative Plan: Extubation in OR  Informed Consent: I have reviewed the patients History and Physical, chart, labs and discussed the procedure including the risks, benefits and alternatives for the proposed anesthesia with the patient or authorized representative who has indicated his/her understanding and acceptance.   Dental advisory given  Plan Discussed with: Anesthesiologist and Surgeon  Anesthesia Plan Comments:        Anesthesia Quick Evaluation

## 2015-02-27 NOTE — Interval H&P Note (Signed)
History and Physical Interval Note:  02/27/2015 9:23 AM  Diane [name]  has presented today for surgery, with the diagnosis of End Stage Renal Disease N18.6; Left upper arm arteriovenous graft ulceration T82.898A  The various methods of treatment have been discussed with the patient and family. After consideration of risks, benefits and other options for treatment, the patient has consented to  Procedure(s): EXCISION OF ULCER; POSSIBLE REVISION OF ARTERIOVENOUS GORETEX GRAFT (Left) as a surgical intervention .  The patient's history has been reviewed, patient examined, no change in status, stable for surgery.  I have reviewed the patient's chart and labs.  Questions were answered to the patient's satisfaction.     Curt Jews

## 2015-02-27 NOTE — Op Note (Signed)
    OPERATIVE REPORT  DATE OF SURGERY: 02/27/2015  PATIENT: Diane [name], 80 y.o. female MRN: [id number]  DOB: 11/02/1934  PRE-OPERATIVE DIAGNOSIS: Ulceration over her left upper arm AV Gore-Tex graft  POST-OPERATIVE DIAGNOSIS:  Same  PROCEDURE: resection of ulcerative area and replacement with interposition 6 mm Gore-Tex graft  SURGEON:  Curt Jews, M.D.  PHYSICIAN ASSISTANT: Trinh  ANESTHESIA:  Local with sedation  EBL: 100 ml  Total I/O In: 400 [I.V.:400] Out: 20 [Blood:20]  BLOOD ADMINISTERED: none  DRAINS: none  SPECIMEN: none  COUNTS CORRECT:  YES  PLAN OF CARE: none PACU   PATIENT DISPOSITION:  PACU - hemodynamically stable  PROCEDURE DETAILS: The patient was taken to the operating placed supine position where the area of the left arm prepped and draped in usual sterile fashion. The patient has a raised eschar over the upper portion of her left upper arm AV Gore-Tex graft. Digital compression proximal distal this area were applied and this eschar was removed. There was approximately a 3 mm hole extending directly into the Gore-Tex graft. There was no evidence of purulence in the graft was completely incorporated. Continue to use digital pressure and ellipse of skin was removed around the sixth open ulcerative area and the Gore-Tex graft was mobilized proximal distal to this opening. The graft was occluded proximal and distal to this opening. The graft was mobilized back to normal graft proximally and distally. The graft was transected proximally and distally. A new interposition graft of 6 mm Gore-Tex was brought onto the field. The new graft was sewn into into the old graft on the arterial portion of the graft. The clamps removed. There was some thrombus in the arterial portion of the graft due to the occlusion. This was removed with a Fogarty catheter giving excellent flow. This was flushed with heparinized saline and reoccluded. Next the new interposition graft  cut to appropriate length and was sewn into into the old graft with a running 6-0 Prolene suture. The usual flushing maneuvers were undertaken the clamps removed and excellent thrill was noted. The Gore-Tex was mobilized the proximal distal portion of the exposure to allow adequate coverage. There was no evidence of any inflammation or purulence. The wound was closed over this in situ repair with 3-0 Vicryl and the subcutaneous tissue. The skin was closed with 3-0 nylon mattress sutures. Sterile dressing was applied the patient was transferred to the recovery room in stable condition   Curt Jews, M.D. 02/27/2015 4:45 PM

## 2015-02-27 NOTE — Anesthesia Postprocedure Evaluation (Signed)
  Anesthesia Post-op Note  Patient: Diane [name]  Procedure(s) Performed: Procedure(s) (LRB): EXCISION OF ULCER AND REVISION OF LEFT UPPER ARM ARTERIOVENOUS GORETEX GRAFT (Left)  Patient Location: PACU  Anesthesia Type: MAC  Level of Consciousness: awake and alert   Airway and Oxygen Therapy: Patient Spontanous Breathing  Post-op Pain: mild  Post-op Assessment: Post-op Vital signs reviewed, Patient's Cardiovascular Status Stable, Respiratory Function Stable, Patent Airway and No signs of Nausea or vomiting  Last Vitals:  Filed Vitals:   02/27/15 1153  BP:   Pulse: 76  Temp: 36.4 C  Resp: 12    Post-op Vital Signs: stable   Complications: No apparent anesthesia complications

## 2015-02-27 NOTE — Transfer of Care (Signed)
Immediate Anesthesia Transfer of Care Note  Patient: Diane [name]  Procedure(s) Performed: Procedure(s): EXCISION OF ULCER AND REVISION OF LEFT UPPER ARM ARTERIOVENOUS GORETEX GRAFT (Left)  Patient Location: PACU  Anesthesia Type:MAC  Level of Consciousness: awake, alert  and oriented  Airway & Oxygen Therapy: Patient Spontanous Breathing and Patient connected to face mask oxygen  Post-op Assessment: Report given to RN, Post -op Vital signs reviewed and stable and Patient moving all extremities X 4  Post vital signs: Reviewed and stable  Last Vitals:  Filed Vitals:   02/27/15 0800  BP: 118/48  Pulse: 91  Temp: 36.6 C  Resp: 16    Complications: No apparent anesthesia complications

## 2015-02-27 NOTE — Anesthesia Procedure Notes (Signed)
Procedure Name: MAC Date/Time: 02/27/2015 9:50 AM Performed by: Merrilyn Puma B Pre-anesthesia Checklist: Patient identified, Suction available, Emergency Drugs available, Timeout performed and Patient being monitored Patient Re-evaluated:Patient Re-evaluated prior to inductionOxygen Delivery Method: Simple face mask Intubation Type: IV induction Placement Confirmation: positive ETCO2 and breath sounds checked- equal and bilateral Dental Injury: Teeth and Oropharynx as per pre-operative assessment

## 2015-02-27 NOTE — H&P (View-Only) (Signed)
Established Dialysis Access  History of Present Illness  Diane [name] is a 80 y.o. (10/15/1935) female who presents for bleeding from Left UA AVG.  The patient has a scab over her left UA for the last month.  She had perfuse bleeding when it was displaced previously.  This LUA AVG was placed on 11/30/10.  The patient denies any steal sx from the LUA AVG.  Past Medical History  Diagnosis Date  . Anemia in chronic kidney disease(285.21)   . Bipolar disorder, unspecified   . Iron deficiency anemia, unspecified   . Secondary hyperparathyroidism (of renal origin)   . Restless legs syndrome (RLS)   . Unspecified hypertensive kidney disease with chronic kidney disease stage V or end stage renal disease   . 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 Personal history of colonic polyps   . Diverticulosis of colon (without mention of hemorrhage) 2006    Colonoscopy Dr. Oletta Lamas  . Coronary artery disease   . Anginal pain   . S/P CABG x 5, 08/15/12, LIMA-LAD;VG-diag;VG-OM; seq VG-PDA,PLA  08/18/2012  . Anxiety   . Depression     Past Surgical History  Procedure Laterality Date  . Arteriovenous graft placement  11/2010    left upper - AVGG Dr. Donnetta Hutching  . Arteriovenous graft placement  12/2009    left lower - AVGG Dr. Donnetta Hutching  . Av fistula placement  07/2009    right upper AVF Dr. Donnetta Hutching  . Av fistula placement  01/2002    right lower Dr. Donnetta Hutching  . Cardiac catheterization  08/12/2012  . Abdominal hysterectomy    . Coronary artery bypass graft  08/14/2012    Procedure: CORONARY ARTERY BYPASS GRAFTING (CABG);  Surgeon: Gaye Pollack, MD;  Location: [place];  Service: Open Heart Surgery;  Laterality: N/A;  times five, using left internal mammary  . Endovein harvest of greater saphenous vein  08/14/2012    Procedure: ENDOVEIN HARVEST OF GREATER SAPHENOUS VEIN;  Surgeon: Gaye Pollack, MD;  Location: [place];  Service: Open Heart Surgery;   Laterality: Bilateral;  . Eye surgery    . Left and right heart catheterization with coronary/graft angiogram  08/12/2012    Procedure: LEFT AND RIGHT HEART CATHETERIZATION WITH Beatrix Fetters;  Surgeon: Troy Sine, MD;  Location: [facility] CATH LAB;  Service: Cardiovascular;;    History   Social History  . Marital Status: Married    Spouse Name: N/A  . Number of Children: N/A  . Years of Education: N/A   Occupational History  . Not on file.   Social History Main Topics  . Smoking status: Never Smoker   . Smokeless tobacco: Never Used  . Alcohol Use: No  . Drug Use: No  . Sexual Activity: No   Other Topics Concern  . Not on file   Social History Narrative    History reviewed. No pertinent family history.   Current Outpatient Prescriptions on File Prior to Visit  Medication Sig Dispense Refill  . amoxicillin-clavulanate (AUGMENTIN) 875-125 MG per tablet Take 1 tablet by mouth 2 (two) times daily. 20 tablet 0  . aspirin 81 MG tablet Take 81 mg by mouth daily. Take on days she doesn't have dialysis    . b complex-vitamin c-folic acid (NEPHRO-VITE) 0.8 MG TABS Take 0.8 mg by mouth daily.    . calcium acetate (PHOSLO) 667 MG capsule Take 667 mg by mouth 2 (  two) times daily.     . clorazepate (TRANXENE) 3.75 MG tablet Take 3.75 mg by mouth 2 (two) times daily as needed for anxiety.    . gabapentin (NEURONTIN) 100 MG capsule Take 100 mg by mouth 2 (two) times daily.     Marland Kitchen HYDROcodone-acetaminophen (NORCO/VICODIN) 5-325 MG per tablet Take 1 tablet by mouth every 6 (six) hours as needed for moderate pain.    . hydrOXYzine (ATARAX/VISTARIL) 50 MG tablet Take 50 mg by mouth at bedtime.     Marland Kitchen Ketotifen Fumarate (ALAWAY OP) Place 1 drop into both eyes 2 (two) times daily as needed. For allergies    . metoprolol tartrate (LOPRESSOR) 25 MG tablet Take 0.5 (12.5 mg total) twice daily on non-dialysis days and take 1 tablet (25 mg total) at bedtime on dialysis days. 30 tablet 0  .  mometasone (ELOCON) 0.1 % cream Apply 1 application topically as needed (for itching from tape).     Marland Kitchen NITROSTAT 0.4 MG SL tablet Place 0.4 mg under the tongue every 5 (five) minutes as needed.     . polyethylene glycol (MIRALAX / GLYCOLAX) packet Take 17 g by mouth daily as needed. For constipation    . ALPRAZolam (XANAX) 0.25 MG tablet Take 1 tablet (0.25 mg total) by mouth 3 (three) times daily as needed for anxiety. (Patient not taking: Reported on 12/16/2014) 10 tablet 0  . ciprofloxacin (CIPRO) 250 MG tablet Take 1 tablet (250 mg total) by mouth 2 (two) times daily. (Patient not taking: Reported on 12/16/2014) 20 tablet 0   No current facility-administered medications on file prior to visit.    Allergies  Allergen Reactions  . Ambien [Zolpidem Tartrate]     Stays awake  . Clavulanic Acid   . Sulfa Antibiotics Other (See Comments)    Migraine   . Latex Rash  . Tape Rash    Can only use paper tape     REVIEW OF SYSTEMS:  (Positives checked otherwise negative)  CARDIOVASCULAR:  []  chest pain, []  chest pressure, []  palpitations, []  shortness of breath when laying flat, []  shortness of breath with exertion,  []  pain in feet when walking, []  pain in feet when laying flat, []  history of blood clot in veins (DVT), []  history of phlebitis, []  swelling in legs, []  varicose veins  PULMONARY:  []  productive cough, []  asthma, []  wheezing  NEUROLOGIC:  []  weakness in arms or legs, []  numbness in arms or legs, []  difficulty speaking or slurred speech, []  temporary loss of vision in one eye, []  dizziness  HEMATOLOGIC:  []  bleeding problems, []  problems with blood clotting too easily  MUSCULOSKEL:  []  joint pain, []  joint swelling  GASTROINTEST:  []  vomiting blood, []  blood in stool     GENITOURINARY:  []  burning with urination, []  blood in urine, [x]  ESRD-HD: T-R-S  PSYCHIATRIC:  []  history of major depression  INTEGUMENTARY:  []  rashes, []  ulcers   Physical Examination  Filed Vitals:    02/24/15 1423  BP: 109/70  Pulse: 101  Height: 5\' 1"  (1.549 m)  Weight: 120 lb 6.4 oz (54.613 kg)  SpO2: 97%   Body mass index is 22.76 kg/(m^2).  General: A&O x 3, WD, Cachectic   Pulmonary: Sym exp, good air movt, CTAB, no rales, rhonchi, & wheezing  Cardiac: RRR, Nl S1, S2, no Murmurs, rubs or gallops  Vascular: Vessel Right Left  Radial Palpable Palpable  Ulnar Not Palpable Not Palpable  Brachial Palpable Palpable   Musculoskeletal: M/S  5/5 throughout , Extremities without  ischemic changes , + palpable thrill in access in LUA AVG, + bruit in access, 0.5 cm scab overlying proximal upper 1/3 of LUA AVG, 1-2 cm PSA overlying distal 1/3 of LUA AVG  Neurologic: Pain and light touch intact in extremities , Motor exam as listed above   Medical Decision Making  Diane [name] is a 80 y.o. female who presents with skin ulcer overlying LUA AVG, ESRD requiring hemodialysis.    Pt aware to avoid disturbing the scab in the LUA AVG.  Per the patient, the patient this LUA AVG has had this scab over the last month so I doubt spontaneous bleeding will occur or it have already.  The previous bleeding episode came with manipulation of the scab.  The patient is schedule for excision of LUA AVG ulcer vs revision of LUA AVG with Dr. Donnetta Hutching.  Risk, benefits, and alternatives to access surgery were discussed.  The patient is aware the risks include but are not limited to: bleeding, infection, steal syndrome, nerve damage, ischemic monomelic neuropathy, failure to mature, need for additional procedures, death and stroke.    The patient agrees to proceed forward with the procedure.  Adele Barthel, MD [facility] Office: [phone] Pager: [phone]  02/24/2015, 2:41 PM

## 2015-02-28 ENCOUNTER — Telehealth: Payer: Self-pay | Admitting: Vascular Surgery

## 2015-02-28 ENCOUNTER — Encounter (HOSPITAL_COMMUNITY): Payer: Self-pay | Admitting: Vascular Surgery

## 2015-02-28 NOTE — Telephone Encounter (Signed)
Left message for pt re appt, dpm  ° °

## 2015-02-28 NOTE — Telephone Encounter (Signed)
-----   Message from Denman George, RN sent at 02/27/2015 11:21 AM EDT ----- Regarding: Zigmund Daniel log; also needs 2 wk. f/u with Dr. Donnetta Hutching   ----- Message -----    From: Alvia Grove, PA-C    Sent: 02/27/2015  10:52 AM      To: V[facility] Charge Pool  S/p revision of left upper arm graft 02/27/15  F/u with Dr. Donnetta Hutching in 2 weeks.  Thanks Maudie Mercury

## 2015-03-10 ENCOUNTER — Encounter: Payer: Self-pay | Admitting: Vascular Surgery

## 2015-03-14 ENCOUNTER — Ambulatory Visit (INDEPENDENT_AMBULATORY_CARE_PROVIDER_SITE_OTHER): Payer: Self-pay | Admitting: Vascular Surgery

## 2015-03-14 ENCOUNTER — Encounter: Payer: Self-pay | Admitting: Vascular Surgery

## 2015-03-14 VITALS — BP 107/67 | HR 78 | Resp 16 | Ht 61.0 in | Wt 119.0 lb

## 2015-03-14 DIAGNOSIS — N186 End stage renal disease: Secondary | ICD-10-CM

## 2015-03-14 DIAGNOSIS — Z992 Dependence on renal dialysis: Secondary | ICD-10-CM

## 2015-03-14 NOTE — Progress Notes (Signed)
Patient resisted today for follow-up of recent urgent surgery for duration of her left upper arm AV Gore-Tex graft. Surgery was on 02/27/2015. She developed ulceration midportion of her left upper arm graft. This appeared to be a typical eschar extending into the graft itself. She was taken to the operating room for exploration and revision. This did confirm that the eschar went directly down into her graft with approximately a 2-3 mm opening in the graft. I did an ellipse of skin and resected the skin around this area. Replaced this portion of the graft with interposition graft with 6 mm [facility] and closed the skin over this. She has been having this same access issues for hemodialysis with access above and below this area of the incision.  She looks quite good today. She has had complete healing of the incision. She does have a horizontal mattress sutures of nylon these will be removed today. She will continue to use the graft above and below this area until one month out from surgery which would be June 9. After that time she continues entire length of the graft. I discussed this with the patient. She will see [place] again on as-needed basis

## 2015-04-05 ENCOUNTER — Encounter (HOSPITAL_COMMUNITY)
Admission: RE | Admit: 2015-04-05 | Discharge: 2015-04-05 | Disposition: A | Discharge status: Home / Self Care | Payer: Medicare Other | Source: Ambulatory Visit | Attending: Nephrology | Admitting: Nephrology

## 2015-04-05 NOTE — Progress Notes (Signed)
Pt came in today for scheduled therapeutic phlebotomy.  Right arm was used. 20 Gauge IV was started in right [facility].   20  cc of blood was removed at a time until we reached the goal of 500 cc which was ordered.  Pt tolerated procedure well.  Will monitor for 15 minutes and recheck vital signs and discharge patient

## 2015-04-06 ENCOUNTER — Encounter (HOSPITAL_COMMUNITY): Admission: RE | Admit: 2015-04-06 | Payer: Medicare Other | Source: Ambulatory Visit

## 2015-04-09 ENCOUNTER — Emergency Department (HOSPITAL_COMMUNITY)
Admission: EM | Admit: 2015-04-09 | Discharge: 2015-04-09 | Disposition: A | Discharge status: Home / Self Care | Payer: Medicare Other | Source: Home / Self Care | Attending: Emergency Medicine | Admitting: Emergency Medicine

## 2015-04-09 ENCOUNTER — Encounter (HOSPITAL_COMMUNITY): Payer: Self-pay | Admitting: Emergency Medicine

## 2015-04-09 DIAGNOSIS — Z862 Personal history of diseases of the blood and blood-forming organs and certain disorders involving the immune mechanism: Secondary | ICD-10-CM | POA: Insufficient documentation

## 2015-04-09 DIAGNOSIS — F419 Anxiety disorder, unspecified: Secondary | ICD-10-CM

## 2015-04-09 DIAGNOSIS — Z992 Dependence on renal dialysis: Secondary | ICD-10-CM

## 2015-04-09 DIAGNOSIS — Z7982 Long term (current) use of aspirin: Secondary | ICD-10-CM

## 2015-04-09 DIAGNOSIS — Z8639 Personal history of other endocrine, nutritional and metabolic disease: Secondary | ICD-10-CM | POA: Insufficient documentation

## 2015-04-09 DIAGNOSIS — F319 Bipolar disorder, unspecified: Secondary | ICD-10-CM | POA: Diagnosis present

## 2015-04-09 DIAGNOSIS — Y841 Kidney dialysis as the cause of abnormal reaction of the patient, or of later complication, without mention of misadventure at the time of the procedure: Secondary | ICD-10-CM

## 2015-04-09 DIAGNOSIS — N2581 Secondary hyperparathyroidism of renal origin: Secondary | ICD-10-CM | POA: Diagnosis present

## 2015-04-09 DIAGNOSIS — R627 Adult failure to thrive: Secondary | ICD-10-CM | POA: Diagnosis present

## 2015-04-09 DIAGNOSIS — Z8739 Personal history of other diseases of the musculoskeletal system and connective tissue: Secondary | ICD-10-CM

## 2015-04-09 DIAGNOSIS — T827XXA Infection and inflammatory reaction due to other cardiac and vascular devices, implants and grafts, initial encounter: Secondary | ICD-10-CM

## 2015-04-09 DIAGNOSIS — D696 Thrombocytopenia, unspecified: Secondary | ICD-10-CM | POA: Diagnosis not present

## 2015-04-09 DIAGNOSIS — B9561 Methicillin susceptible Staphylococcus aureus infection as the cause of diseases classified elsewhere: Secondary | ICD-10-CM | POA: Diagnosis present

## 2015-04-09 DIAGNOSIS — I120 Hypertensive chronic kidney disease with stage 5 chronic kidney disease or end stage renal disease: Secondary | ICD-10-CM | POA: Diagnosis present

## 2015-04-09 DIAGNOSIS — Z91040 Latex allergy status: Secondary | ICD-10-CM | POA: Insufficient documentation

## 2015-04-09 DIAGNOSIS — I25119 Atherosclerotic heart disease of native coronary artery with unspecified angina pectoris: Secondary | ICD-10-CM

## 2015-04-09 DIAGNOSIS — Y832 Surgical operation with anastomosis, bypass or graft as the cause of abnormal reaction of the patient, or of later complication, without mention of misadventure at the time of the procedure: Secondary | ICD-10-CM | POA: Diagnosis present

## 2015-04-09 DIAGNOSIS — Z79899 Other long term (current) drug therapy: Secondary | ICD-10-CM | POA: Insufficient documentation

## 2015-04-09 DIAGNOSIS — Z880 Allergy status to penicillin: Secondary | ICD-10-CM

## 2015-04-09 DIAGNOSIS — N186 End stage renal disease: Secondary | ICD-10-CM

## 2015-04-09 DIAGNOSIS — D631 Anemia in chronic kidney disease: Secondary | ICD-10-CM | POA: Diagnosis present

## 2015-04-09 DIAGNOSIS — Z951 Presence of aortocoronary bypass graft: Secondary | ICD-10-CM

## 2015-04-09 DIAGNOSIS — K5900 Constipation, unspecified: Secondary | ICD-10-CM | POA: Diagnosis present

## 2015-04-09 DIAGNOSIS — Z86010 Personal history of colonic polyps: Secondary | ICD-10-CM

## 2015-04-09 DIAGNOSIS — R7881 Bacteremia: Secondary | ICD-10-CM | POA: Diagnosis not present

## 2015-04-09 DIAGNOSIS — I2510 Atherosclerotic heart disease of native coronary artery without angina pectoris: Secondary | ICD-10-CM | POA: Diagnosis present

## 2015-04-09 LAB — COMPREHENSIVE METABOLIC PANEL
ALT: 85 U/L — ABNORMAL HIGH (ref 14–54)
AST: 81 U/L — ABNORMAL HIGH (ref 15–41)
Albumin: 3.4 g/dL — ABNORMAL LOW (ref 3.5–5.0)
Alkaline Phosphatase: 75 U/L (ref 38–126)
Anion gap: 14 (ref 5–15)
BUN: 29 mg/dL — ABNORMAL HIGH (ref 6–20)
CO2: 27 mmol/L (ref 22–32)
Calcium: 9.3 mg/dL (ref 8.9–10.3)
Chloride: 100 mmol/L — ABNORMAL LOW (ref 101–111)
Creatinine, Ser: 4.23 mg/dL — ABNORMAL HIGH (ref 0.44–1.00)
GFR calc Af Amer: 11 mL/min — ABNORMAL LOW (ref >60)
GFR calc non Af Amer: 9 mL/min — ABNORMAL LOW (ref >60)
Glucose, Bld: 148 mg/dL — ABNORMAL HIGH (ref 65–99)
Potassium: 3.5 mmol/L (ref 3.5–5.1)
Sodium: 141 mmol/L (ref 135–145)
Total Bilirubin: 0.2 mg/dL — ABNORMAL LOW (ref 0.3–1.2)
Total Protein: 6.3 g/dL — ABNORMAL LOW (ref 6.5–8.1)

## 2015-04-09 LAB — CBC WITH DIFFERENTIAL/PLATELET
Basophils Absolute: 0 10*3/uL (ref 0.0–0.1)
Basophils Relative: 0 % (ref 0–1)
Eosinophils Absolute: 0.4 10*3/uL (ref 0.0–0.7)
Eosinophils Relative: 4 % (ref 0–5)
HCT: 33.6 % — ABNORMAL LOW (ref 36.0–46.0)
Hemoglobin: 11.2 g/dL — ABNORMAL LOW (ref 12.0–15.0)
Lymphocytes Relative: 29 % (ref 12–46)
Lymphs Abs: 2.8 10*3/uL (ref 0.7–4.0)
MCH: 31 pg (ref 26.0–34.0)
MCHC: 33.3 g/dL (ref 30.0–36.0)
MCV: 93.1 fL (ref 78.0–100.0)
Monocytes Absolute: 0.9 10*3/uL (ref 0.1–1.0)
Monocytes Relative: 10 % (ref 3–12)
Neutro Abs: 5.5 10*3/uL (ref 1.7–7.7)
Neutrophils Relative %: 57 % (ref 43–77)
Platelets: 158 10*3/uL (ref 150–400)
RBC: 3.61 MIL/uL — ABNORMAL LOW (ref 3.87–5.11)
RDW: 13.5 % (ref 11.5–15.5)
WBC: 9.6 10*3/uL (ref 4.0–10.5)

## 2015-04-09 MED ORDER — DOXYCYCLINE HYCLATE 100 MG PO TABS
100.0000 mg | ORAL_TABLET | Freq: Once | ORAL | Status: AC
  Administered 2015-04-09: 100 mg via ORAL
  Filled 2015-04-09: qty 1

## 2015-04-09 MED ORDER — DOXYCYCLINE HYCLATE 100 MG PO CAPS: 100.0000 mg | ORAL_CAPSULE | Freq: Two times a day (BID) | ORAL | Status: DC

## 2015-04-09 NOTE — Consult Note (Signed)
[facility] [place]  Patient name: Diane [name] MRN: [id number] DOB: 1935/10/21 Sex: female  REASON FOR CONSULT: bleeding from right upper arm AV graft  HPI: [name] is a 80 y.o. female who dialyzes on Tuesdays Thursdays and Saturdays. She peeled some tape off from her access tonight and had some bleeding from one specific spot on her fistula. She presented to the emergency department and we were asked to evaluate her graft. She denies any fever or chills. She did note that a small amount of pus came from where she was bleeding.  Her graft has been in for approximately 4 years. It was revised recently by Dr. Donnetta Hutching. On 02/27/2015 she had resection of an ulcerative area and replacement with an interposition 6 mm PTFE graft.  Past Medical History  Diagnosis Date  . Anemia in chronic kidney disease(285.21)   . Bipolar disorder, unspecified   . Iron deficiency anemia, unspecified   . Secondary hyperparathyroidism (of renal origin)   . Restless legs syndrome (RLS)   . Unspecified hypertensive kidney disease with chronic kidney disease stage V or end stage renal disease   . 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 Personal history of colonic polyps   . Diverticulosis of colon (without mention of hemorrhage) 2006    Colonoscopy Dr. Oletta Lamas  . Coronary artery disease   . Anginal pain   . S/P CABG x 5, 08/15/12, LIMA-LAD;VG-diag;VG-OM; seq VG-PDA,PLA  08/18/2012  . Anxiety   . Depression     History reviewed. No pertinent family history.  SOCIAL HISTORY: History  Substance Use Topics  . Smoking status: Never Smoker   . Smokeless tobacco: Never Used  . Alcohol Use: No    Allergies  Allergen Reactions  . Ambien [Zolpidem Tartrate]     Stays awake  . Amoxicillin Diarrhea  . Clavulanic Acid   . Sulfa Antibiotics Other (See Comments)    Migraine   . Latex Rash  . Tape Rash    Can  only use paper tape     No current facility-administered medications for this encounter.   Current Outpatient Prescriptions  Medication Sig Dispense Refill  . acetaminophen (TYLENOL) 500 MG tablet Take 500 mg by mouth every 6 (six) hours as needed for mild pain or moderate pain.    Marland Kitchen aspirin 81 MG tablet Take 81 mg by mouth daily. Take on days she doesn't have dialysis    . calcium acetate (PHOSLO) 667 MG capsule Take 667 mg by mouth 3 (three) times daily with meals.     . clorazepate (TRANXENE) 3.75 MG tablet Take 3.75 mg by mouth 2 (two) times daily as needed for anxiety.     . gabapentin (NEURONTIN) 100 MG capsule Take 200 mg by mouth at bedtime.     . hydrOXYzine (ATARAX/VISTARIL) 50 MG tablet Take 50 mg by mouth at bedtime.     . mometasone (ELOCON) 0.1 % cream Apply 1 application topically as needed (for itching from tape).     . multivitamin (RENA-VIT) TABS tablet Take 1 tablet by mouth daily.     Marland Kitchen NITROSTAT 0.4 MG SL tablet Place 0.4 mg under the tongue every 5 (five) minutes as needed.     Marland Kitchen oxyCODONE-acetaminophen (ROXICET) 5-325 MG per tablet Take 1 tablet by mouth every 6 (six) hours as needed for severe pain. (Patient not taking: Reported on 03/14/2015) 20 tablet 0  .  polyethylene glycol (MIRALAX / GLYCOLAX) packet Take 17 g by mouth daily as needed. For constipation    . Propylene Glycol 0.6 % SOLN Apply 1 drop to eye daily as needed (allergies).      REVIEW OF SYSTEMS: Valu.Nieves ] denotes positive finding; [  ] denotes negative finding CARDIOVASCULAR:  [ ]  chest pain   [ ]  chest pressure   [ ]  palpitations   [ ]  orthopnea   [ ]  dyspnea on exertion   [ ]  claudication   [ ]  rest pain   [ ]  DVT   [ ]  phlebitis PULMONARY:   [ ]  productive cough   [ ]  asthma   [ ]  wheezing NEUROLOGIC:   [ ]  weakness  [ ]  paresthesias  [ ]  aphasia  [ ]  amaurosis  [ ]  dizziness HEMATOLOGIC:   [ ]  bleeding problems   [ ]  clotting disorders MUSCULOSKELETAL:  [ ]  joint pain   [ ]  joint swelling [ ]  leg  swelling GASTROINTESTINAL: [ ]   blood in stool  [ ]   hematemesis GENITOURINARY:  [ ]   dysuria  [ ]   hematuria PSYCHIATRIC:  [ ]  history of major depression INTEGUMENTARY:  [ ]  rashes  [ ]  ulcers CONSTITUTIONAL:  [ ]  fever   [ ]  chills  PHYSICAL EXAM: Filed Vitals:   04/09/15 2145 04/09/15 2200 04/09/15 2215 04/09/15 2230  BP: 122/54 113/54 119/49 112/49  Pulse: 86 85 82 81  Temp:      TempSrc:      Resp: 15 16 14 12   Height:      Weight:      SpO2: 99% 100% 100% 99%   Body mass index is 22.31 kg/(m^2). GENERAL: The patient is a well-nourished female, in no acute distress. The vital signs are documented above. CARDIOVASCULAR: There is a regular rate and rhythm.  PULMONARY: There is good air exchange bilaterally without wheezing or rales. ABDOMEN: Soft and non-tender with normal pitched bowel sounds.  MUSCULOSKELETAL: There are no major deformities or cyanosis. NEUROLOGIC: No focal weakness or paresthesias are detected. SKIN: there is some very mild erythema overlying the proximal aspect of her fistula but I did not express any purulent material. There was no active bleeding currently. The skin appeared to be intact over the graft and there was no wounds specifically. PSYCHIATRIC: The patient has a normal affect.  DATA:  Lab Results  Component Value Date   WBC 9.6 04/09/2015   HGB 11.2* 04/09/2015   HCT 33.6* 04/09/2015   MCV 93.1 04/09/2015   PLT 158 04/09/2015   Lab Results  Component Value Date   NA 142 02/27/2015   K 3.8 02/27/2015   CL 92* 03/09/2014   CO2 27 03/09/2014   Lab Results  Component Value Date   CREATININE 4.53* 03/09/2014   Lab Results  Component Value Date   INR 1.30 08/14/2012   INR 1.01 08/12/2012   Lab Results  Component Value Date   HGBA1C 5.6 12/13/2013   MEDICAL ISSUES: Bleeding from Left Upper Arm AV Graft: The bleeding from her graft has stopped. There is no ulcer overlying the graft. I did not see any obvious signs of infection  except for some very mild cellulitis area and I have recommended that she be treated with po anti-biotics which the emergency department will start. My office will call her for me to follow up with her a week from Wednesday since she dialyzes on Tuesdays Thursdays and Saturdays. Dialysis they can remove the  dressing that I applied and applied bacitracin and a Band-Aid. She knows to call if she has any further bleeding problems or fever.  Deitra Mayo [facility] [place]: [phone]

## 2015-04-09 NOTE — ED Notes (Signed)
Per PTAR, patient was at dinner, and noticed her dialysis access, graft in left arm, was bleeding. Restaurant staff applied towels and packing tape to control bleeding, reported considerable amount of blood loss. Per ems, pt denies nausea, vomiting, light headedness, bp 138/64, p 92, o2 sat 96% on room air, rr 20. Currently on coumadin, and last dialysis session was yesterday. Tue, thurs, sat is usual schedule.

## 2015-04-09 NOTE — ED Notes (Signed)
Discussed plan of care with Merry Proud, PA-C. No code sepsis at this time, but will have blood cultures collected.

## 2015-04-09 NOTE — ED Provider Notes (Signed)
CSN: [id number]     Arrival date & time 04/09/15  2109 History   First MD Initiated Contact with Patient 04/09/15 2110     Chief Complaint  Patient presents with  . Vascular Access Problem    HPI   80 year old female with end-stage renal disease with AV Gore-Tex graft on the left upper extremity presents today with bleeding. Patient reports she was eating dinner tonight when the graft site started bleeding. Patient notes that yesterday she noticed redness and swelling around the graft site, pus coming from the site this morning with a scab overlying. She reports the leading today was managed with direct pressure, called 911 and brought to the emergency room for further evaluation. She reports she's been feeling well over the last few days, episodic chills denies fever, no changes in mentation. Patient reports that she goes to dialysis Tuesday Thursday Saturday, notes that the site of bleeding has not been used for the last 2 sessions of dialysis.   Past Medical History  Diagnosis Date  . Anemia in chronic kidney disease(285.21)   . Bipolar disorder, unspecified   . Iron deficiency anemia, unspecified   . Secondary hyperparathyroidism (of renal origin)   . Restless legs syndrome (RLS)   . Unspecified hypertensive kidney disease with chronic kidney disease stage V or end stage renal disease   . 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 Personal history of colonic polyps   . Diverticulosis of colon (without mention of hemorrhage) 2006    Colonoscopy Dr. Oletta Lamas  . Coronary artery disease   . Anginal pain   . S/P CABG x 5, 08/15/12, LIMA-LAD;VG-diag;VG-OM; seq VG-PDA,PLA  08/18/2012  . Anxiety   . Depression    Past Surgical History  Procedure Laterality Date  . Arteriovenous graft placement  11/2010    left upper - AVGG Dr. Donnetta Hutching  . Arteriovenous graft placement  12/2009    left lower - AVGG Dr. Donnetta Hutching  . Av fistula placement   07/2009    right upper AVF Dr. Donnetta Hutching  . Av fistula placement  01/2002    right lower Dr. Donnetta Hutching  . Cardiac catheterization  08/12/2012  . Abdominal hysterectomy    . Coronary artery bypass graft  08/14/2012    Procedure: CORONARY ARTERY BYPASS GRAFTING (CABG);  Surgeon: Gaye Pollack, MD;  Location: [place];  Service: Open Heart Surgery;  Laterality: N/A;  times five, using left internal mammary  . Endovein harvest of greater saphenous vein  08/14/2012    Procedure: ENDOVEIN HARVEST OF GREATER SAPHENOUS VEIN;  Surgeon: Gaye Pollack, MD;  Location: [place];  Service: Open Heart Surgery;  Laterality: Bilateral;  . Eye surgery    . Left and right heart catheterization with coronary/graft angiogram  08/12/2012    Procedure: LEFT AND RIGHT HEART CATHETERIZATION WITH Beatrix Fetters;  Surgeon: Troy Sine, MD;  Location[facility] C[facility] LAB;  Service: Cardiovascular;;  . Revision of arteriovenous goretex graft Left 02/27/2015    Procedure: EXCISION OF ULCER AND REVISION OF LEFT UPPER ARM ARTERIOVENOUS GORETEX GRAFT;  Surgeon: Rosetta Posner, MD;  Location: [place];  Service: Vascular;  Laterality: Left;   History reviewed. No pertinent family history. History  Substance Use Topics  . Smoking status: Never Smoker   . Smokeless tobacco: Never Used  . Alcohol Use: No   OB History    No data available     Review of Systems  All other systems reviewed and are negative.  Allergies  Ambien; Amoxicillin; Clavulanic acid; Sulfa antibiotics; Latex; and Tape  Home Medications   Prior to Admission medications   Medication Sig Start Date End Date Taking? Authorizing Provider  acetaminophen (TYLENOL) 500 MG tablet Take 500 mg by mouth every 6 (six) hours as needed for mild pain or moderate pain.    Historical Provider, MD  aspirin 81 MG tablet Take 81 mg by mouth daily. Take on days she doesn't have dialysis    Historical Provider, MD  calcium acetate (PHOSLO) 667 MG capsule Take 667 mg by mouth 3  (three) times daily with meals.  07/05/13   Historical Provider, MD  clorazepate (TRANXENE) 3.75 MG tablet Take 3.75 mg by mouth 2 (two) times daily as needed for anxiety.     Historical Provider, MD  doxycycline (VIBRAMYCIN) 100 MG capsule Take 1 capsule (100 mg total) by mouth 2 (two) times daily. 04/09/15   Okey Regal, PA-C  gabapentin (NEURONTIN) 100 MG capsule Take 200 mg by mouth at bedtime.     Historical Provider, MD  hydrOXYzine (ATARAX/VISTARIL) 50 MG tablet Take 50 mg by mouth at bedtime.  09/04/12   Historical Provider, MD  mometasone (ELOCON) 0.1 % cream Apply 1 application topically as needed (for itching from tape).  08/10/13   Historical Provider, MD  multivitamin (RENA-VIT) TABS tablet Take 1 tablet by mouth daily.  11/29/14   Historical Provider, MD  NITROSTAT 0.4 MG SL tablet Place 0.4 mg under the tongue every 5 (five) minutes as needed.  07/07/12   Historical Provider, MD  oxyCODONE-acetaminophen (ROXICET) 5-325 MG per tablet Take 1 tablet by mouth every 6 (six) hours as needed for severe pain. Patient not taking: Reported on 03/14/2015 02/27/15   Alvia Grove, PA-C  polyethylene glycol (MIRALAX / GLYCOLAX) packet Take 17 g by mouth daily as needed. For constipation    Historical Provider, MD  Propylene Glycol 0.6 % SOLN Apply 1 drop to eye daily as needed (allergies).    Historical Provider, MD   BP 108/46 mmHg  Pulse 75  Temp(Src) 97.8 F (36.6 C) (Oral)  Resp 17  Ht 5\' 1"  (1.549 m)  Wt 118 lb (53.524 kg)  BMI 22.31 kg/m2  SpO2 100% Physical Exam  Constitutional: She is oriented to person, place, and time. She appears well-developed and well-nourished.  HENT:  Head: Normocephalic and atraumatic.  Eyes: Conjunctivae are normal. Pupils are equal, round, and reactive to light. Right eye exhibits no discharge. Left eye exhibits no discharge. No scleral icterus.  Neck: Normal range of motion. No JVD present. No tracheal deviation present.  Cardiovascular: Normal rate,  regular rhythm, normal heart sounds and intact distal pulses.   Pulmonary/Chest: Effort normal. No stridor.  Musculoskeletal:  Full pain-free range of motion of wrist elbow shoulder on the left. Distal pulses intact. Sensation intact. Please see picture for site of localized infection.  Neurological: She is alert and oriented to person, place, and time. Coordination normal.  Psychiatric: She has a normal mood and affect. Her behavior is normal. Judgment and thought content normal.  Nursing note and vitals reviewed.        ED Course  Procedures (including critical care time) Labs Review Labs Reviewed  COMPREHENSIVE METABOLIC PANEL - Abnormal; Notable for the following:    Chloride 100 (*)    Glucose, Bld 148 (*)    BUN 29 (*)    Creatinine, Ser 4.23 (*)    Total Protein 6.3 (*)  Albumin 3.4 (*)    AST 81 (*)    ALT 85 (*)    Total Bilirubin 0.2 (*)    GFR calc non Af Amer 9 (*)    GFR calc Af Amer 11 (*)    All other components within normal limits  CBC WITH DIFFERENTIAL/PLATELET - Abnormal; Notable for the following:    RBC 3.61 (*)    Hemoglobin 11.2 (*)    HCT 33.6 (*)    All other components within normal limits  CULTURE, BLOOD (ROUTINE X 2)  CULTURE, BLOOD (ROUTINE X 2)    Imaging Review No results found.   EKG Interpretation   Date/Time:  Sunday April 09 2015 21:41:11 EDT Ventricular Rate:  86 PR Interval:  149 QRS Duration: 76 QT Interval:  394 QTC Calculation: 471 R Axis:   60 Text Interpretation:  Sinus rhythm Normal ECG Confirmed by POLLINA  MD,  CHRISTOPHER [phone]) on 04/09/2015 9:45:52 PM      MDM   Final diagnoses:  Vascular graft infection, initial encounter    Labs: CBC, CMP, Blood cultures  Imaging: none  Consults: vascular surgery  Therapeutics: none  Assessment:  Vascular graft infection  Plan: Patient presents with infection about the vascular graft site. Uncertain if this is a superficial or true graft infection. Patient has  no systemic symptoms at this time. Dr. Scot Dock evaluated patient in the ED and recommended oral antibiotics, and follow-up in his office this week for further evaluation. Patient was given strict return precautions, contact information for adequate follow-up. Patient verbalizes her understanding and agreement and had no further questions concerns at the time of discharge.  Discharge meds: Doxycycline      Okey Regal, PA-C 04/10/15 2751  Orpah Greek, MD 04/10/15 2055

## 2015-04-09 NOTE — Discharge Instructions (Signed)
Dialysis Vascular Access Malfunction A vascular access is an entrance to your blood vessels that can be used for dialysis. A vascular access can be made in one of several ways:   Joining an artery to a vein under your skin to make a bigger blood vessel called a fistula.   Joining an artery to a vein under your skin using a soft tube called a graft.   Placing a thin, flexible tube (catheter) in a large vein, usually in your neck.  A vascular access may malfunction or become blocked.  WHAT CAN CAUSE YOUR VASCULAR ACCESS TO MALFUNCTION?  Infection (common).   A blood clot inside a part of the fistula, graft, or catheter. A blood clot can completely or partially block the flow of blood.   A kink in the graft or catheter.   A collection of blood (called a hematoma or bruise) next to the graft or catheter that pushes against it, blocking the flow of blood.  WHAT ARE SIGNS AND SYMPTOMS OF VASCULAR ACCESS MALFUNCTION?  There is a change in the vibration or pulse of your fistula or graft.  The vibration or pulse of your fistula or graft is gone.   There is new or unusual swelling of the area around the access.   There was an unsuccessful puncture of your access by the dialysis team.   The flow of blood through the fistula, graft, or catheter is too slow for effective dialysis.   When routine dialysis is completed and the needle is removed, bleeding lasts for too long a time.  WHAT HAPPENS IF MY VASCULAR ACCESS MALFUNCTIONS? Your health care provider may order blood work, cultures, or an X-ray test in order to learn what may be wrong with your vascular access. The X-ray test involves the injection of a liquid into the vascular access. The liquid shows up on the X-ray and allows your health care provider to see if there is a blockage in the vascular access.  Treatment varies depending on the cause of the malfunction:   If the vascular access is infected, your health care  provider may prescribe antibiotic medicine to control the infection.   If a clot is found in the vascular access, you may need surgery to remove the clot.   If a blockage in the vascular access is due to some other cause (such as a kink in a graft), then you will likely need surgery to unblock or replace the graft.  HOME CARE INSTRUCTIONS: Follow up with your surgeon or other health care provider if you were instructed to do so. This is very important. Any delay in follow-up could cause permanent dysfunction of the vascular access, which may be dangerous.  SEEK MEDICAL CARE IF:   Fever develops.   Swelling and pain around the vascular access gets worse or new pain develops.  Pain, numbness, or an unusual pale skin color develops in the hand on the side of your vascular access. SEEK IMMEDIATE MEDICAL CARE IF: Unusual bleeding develops at the location of the vascular access. MAKE SURE YOU:  Understand these instructions.  Will watch your condition.  Will get help right away if you are not doing well or get worse. Document Released: 09/09/2006 Document Revised: 02/21/2014 Document Reviewed: 03/11/2013 [facility] Patient Information 2015 [place], [place]. This information is not intended to replace advice given to you by your health care provider. Make sure you discuss any questions you have with your health care provider.   Please remember to have  dressing removed and topical bacitracin applied to wound at your next dialysis treatment. Please use oral antibiotics as directed. Please follow up next week with vascular surgery next week for further evaluation and management. Please return immediately to the emergency room if new or worsening signs or symptoms present.  Catheter-Associated Bloodstream Infections FAQs WHAT IS A CATHETER-ASSOCIATED BLOODSTREAM INFECTION?  A "central line" or "central catheter" is a tube that is placed into a patient's large vein, usually in the neck, chest,  arm, or groin. The catheter is often used to draw blood, or give fluids or medications. It may be left in place for several weeks. A bloodstream infection can occur when bacteria or other germs travel down a "central line" and enter the blood. If you develop a catheter-associated bloodstream infection you may become ill with fevers and chills or the skin around the catheter may become sore and red. CAN A CATHETER-RELATED BLOODSTREAM INFECTION BE TREATED? A catheter-associated bloodstream infection is serious, but often can be successfully treated with antibiotics. The catheter might need to be removed if you develop an infection. WHAT ARE SOME OF THE THINGS THAT HOSPITALS ARE DOING TO PREVENT CATHETER-ASSOCIATED BLOODSTREAM INFECTIONS? To prevent catheter-associated bloodstream infections doctors and nurses will:  Choose a vein where the catheter can be safely inserted and where the risk for infection is small.  Clean their hands with soap and water or an alcohol-based hand rub before putting in the catheter.  Wear a mask, cap, sterile gown, and sterile gloves when putting in the catheter to keep it sterile. The patient will be covered with a sterile sheet.  Clean the patient's skin with an antiseptic cleanser before putting in the catheter.  Clean their hands, wear gloves, and clean the catheter opening with an antiseptic solution before using the catheter to draw blood or give medications. Healthcare providers also clean their hands and wear gloves when changing the bandage that covers the area where the catheter enters the skin.  Decide every day if the patient still needs to have the catheter. The catheter will be removed as soon as it is no longer needed.  Carefully handle medications and fluids that are given through the catheter. WHAT CAN I DO TO HELP PREVENT A CATHETER-ASSOCIATED BLOODSTREAM INFECTION?   Ask your doctors and nurses to explain why you need the catheter and how long you  will have it.  Ask your doctors and nurses if they will be using all of the prevention methods discussed above.  Make sure that all doctors and nurses caring for you clean their hands with soap and water or an alcohol-based hand rub before and after caring for you.  If you do not see your providers clean their hands, please ask them to do so.  If the bandage comes off or becomes wet or dirty, tell your nurse or doctor immediately.  Inform your nurse or doctor if the area around your catheter is sore or red.  Do not let family and friends who visit touch the catheter or the tubing.  Make sure family and friends clean their hands with soap and water or an alcohol-based hand rub before and after visiting you. WHAT DO I NEED TO DO WHEN I [place]? Some patients are sent home from the hospital with a catheter in order to continue their treatment. If you go home with a catheter, your doctors and nurses will explain everything you need to know about taking care of your catheter.  Make  sure you understand how to care for the catheter before leaving the hospital. For example, ask for instructions on showering or bathing with the catheter and how to change the catheter dressing.  Make sure you know who to contact if you have questions or problems after you get home.  Make sure you wash your hands with soap and water or an alcohol-based hand rub before handling your catheter.  Watch for the signs and symptoms of catheter-associated bloodstream infection, such as soreness or redness at the catheter site or fever, and call your healthcare provider immediately if any occur. If you have questions, please ask your doctor or nurse. Developed and co-sponsored by [name] for [place] [phone]); Infectious Diseases Society of [place] (IDSA); [place]; Association for Professionals in Infection Control and Epidemiology (APIC); Center for  Disease Control (CDC); and The Joint Commission Document Released: 02/01/2011 Document Revised: 07/01/2012 Document Reviewed: 12/13/2013 [facility] Patient Information 2015 [place], [place]. This information is not intended to replace advice given to you by your health care provider. Make sure you discuss any questions you have with your health care provider.   Please remember to have dressing removed and topical bacitracin applied to wound at your next dialysis treatment. Please use oral antibiotics as directed. Please follow up next week with vascular surgery next week for further evaluation and management. Please return immediately to the emergency room if new or worsening signs or symptoms present.

## 2015-04-09 NOTE — ED Notes (Signed)
Merry Proud, PA-C, at the bedside.

## 2015-04-09 NOTE — ED Provider Notes (Signed)
Patient presented to the ER with bleeding from dialysis site. Patient reports that she had a scab over her fistula that she picked off and then had bleeding. She reports that there was some pus that came out of it earlier.  Face to face Exam: HEENT - PERRLA Lungs - CTAB Heart - RRR, no M/R/G Abd - S/NT/ND Neuro - alert, oriented x3 Skin - mild erythema over her dialysis fistula without any induration or fluctuance  Plan: Basic workup and vascular consultation. Vascular surgery has seen the patient, feels that the patient can be safely discharged and will follow-up in the office.  Orpah Greek, MD 04/09/15 [phone]

## 2015-04-10 ENCOUNTER — Encounter (HOSPITAL_COMMUNITY): Payer: Self-pay | Admitting: *Deleted

## 2015-04-10 ENCOUNTER — Telehealth: Payer: Self-pay | Admitting: Vascular Surgery

## 2015-04-10 ENCOUNTER — Emergency Department (HOSPITAL_COMMUNITY): Payer: Medicare Other

## 2015-04-10 ENCOUNTER — Inpatient Hospital Stay (HOSPITAL_COMMUNITY)
Admission: EM | Admit: 2015-04-10 | Discharge: 2015-04-15 | DRG: 252 | Disposition: A | Discharge status: Skilled Nursing Facility | Payer: Medicare Other | Attending: Internal Medicine | Admitting: Internal Medicine

## 2015-04-10 DIAGNOSIS — I120 Hypertensive chronic kidney disease with stage 5 chronic kidney disease or end stage renal disease: Secondary | ICD-10-CM | POA: Diagnosis present

## 2015-04-10 DIAGNOSIS — Z992 Dependence on renal dialysis: Secondary | ICD-10-CM

## 2015-04-10 DIAGNOSIS — R7881 Bacteremia: Secondary | ICD-10-CM | POA: Diagnosis present

## 2015-04-10 DIAGNOSIS — T82898A Other specified complication of vascular prosthetic devices, implants and grafts, initial encounter: Secondary | ICD-10-CM | POA: Diagnosis not present

## 2015-04-10 DIAGNOSIS — N186 End stage renal disease: Secondary | ICD-10-CM

## 2015-04-10 DIAGNOSIS — Z7982 Long term (current) use of aspirin: Secondary | ICD-10-CM | POA: Diagnosis not present

## 2015-04-10 DIAGNOSIS — D631 Anemia in chronic kidney disease: Secondary | ICD-10-CM | POA: Diagnosis present

## 2015-04-10 DIAGNOSIS — Y832 Surgical operation with anastomosis, bypass or graft as the cause of abnormal reaction of the patient, or of later complication, without mention of misadventure at the time of the procedure: Secondary | ICD-10-CM | POA: Diagnosis present

## 2015-04-10 DIAGNOSIS — F319 Bipolar disorder, unspecified: Secondary | ICD-10-CM | POA: Diagnosis present

## 2015-04-10 DIAGNOSIS — I770 Arteriovenous fistula, acquired: Secondary | ICD-10-CM | POA: Diagnosis not present

## 2015-04-10 DIAGNOSIS — T827XXA Infection and inflammatory reaction due to other cardiac and vascular devices, implants and grafts, initial encounter: Secondary | ICD-10-CM | POA: Diagnosis present

## 2015-04-10 DIAGNOSIS — Z951 Presence of aortocoronary bypass graft: Secondary | ICD-10-CM | POA: Diagnosis not present

## 2015-04-10 DIAGNOSIS — I2510 Atherosclerotic heart disease of native coronary artery without angina pectoris: Secondary | ICD-10-CM | POA: Diagnosis present

## 2015-04-10 DIAGNOSIS — D696 Thrombocytopenia, unspecified: Secondary | ICD-10-CM | POA: Diagnosis not present

## 2015-04-10 DIAGNOSIS — F419 Anxiety disorder, unspecified: Secondary | ICD-10-CM | POA: Diagnosis present

## 2015-04-10 DIAGNOSIS — R627 Adult failure to thrive: Secondary | ICD-10-CM | POA: Diagnosis not present

## 2015-04-10 DIAGNOSIS — B9561 Methicillin susceptible Staphylococcus aureus infection as the cause of diseases classified elsewhere: Secondary | ICD-10-CM | POA: Diagnosis present

## 2015-04-10 DIAGNOSIS — K5900 Constipation, unspecified: Secondary | ICD-10-CM | POA: Diagnosis present

## 2015-04-10 DIAGNOSIS — N2581 Secondary hyperparathyroidism of renal origin: Secondary | ICD-10-CM | POA: Diagnosis present

## 2015-04-10 LAB — CBC WITH DIFFERENTIAL/PLATELET
Basophils Absolute: 0.1 10*3/uL (ref 0.0–0.1)
Basophils Relative: 1 % (ref 0–1)
EOS ABS: 0.3 10*3/uL (ref 0.0–0.7)
EOS PCT: 4 % (ref 0–5)
HCT: 32.7 % — ABNORMAL LOW (ref 36.0–46.0)
Hemoglobin: 10.7 g/dL — ABNORMAL LOW (ref 12.0–15.0)
LYMPHS ABS: 2.5 10*3/uL (ref 0.7–4.0)
LYMPHS PCT: 28 % (ref 12–46)
MCH: 30.1 pg (ref 26.0–34.0)
MCHC: 32.7 g/dL (ref 30.0–36.0)
MCV: 92.1 fL (ref 78.0–100.0)
MONO ABS: 0.5 10*3/uL (ref 0.1–1.0)
MONOS PCT: 6 % (ref 3–12)
NEUTROS ABS: 5.7 10*3/uL (ref 1.7–7.7)
Neutrophils Relative %: 63 % (ref 43–77)
Platelets: 149 10*3/uL — ABNORMAL LOW (ref 150–400)
RBC: 3.55 MIL/uL — ABNORMAL LOW (ref 3.87–5.11)
RDW: 13.5 % (ref 11.5–15.5)
WBC: 9.1 10*3/uL (ref 4.0–10.5)

## 2015-04-10 LAB — COMPREHENSIVE METABOLIC PANEL
ALK PHOS: 75 U/L (ref 38–126)
ALT: 84 U/L — AB (ref 14–54)
AST: 77 U/L — ABNORMAL HIGH (ref 15–41)
Albumin: 3.5 g/dL (ref 3.5–5.0)
Anion gap: 10 (ref 5–15)
BUN: 40 mg/dL — ABNORMAL HIGH (ref 6–20)
CO2: 25 mmol/L (ref 22–32)
Calcium: 9.3 mg/dL (ref 8.9–10.3)
Chloride: 106 mmol/L (ref 101–111)
Creatinine, Ser: 4.81 mg/dL — ABNORMAL HIGH (ref 0.44–1.00)
GFR calc non Af Amer: 8 mL/min — ABNORMAL LOW (ref >60)
GFR, EST AFRICAN AMERICAN: 9 mL/min — AB (ref >60)
GLUCOSE: 161 mg/dL — AB (ref 65–99)
POTASSIUM: 4.6 mmol/L (ref 3.5–5.1)
Sodium: 141 mmol/L (ref 135–145)
TOTAL PROTEIN: 6.3 g/dL — AB (ref 6.5–8.1)
Total Bilirubin: 0.3 mg/dL (ref 0.3–1.2)

## 2015-04-10 MED ORDER — VANCOMYCIN HCL 500 MG IV SOLR
500.0000 mg | INTRAVENOUS | Status: DC
  Administered 2015-04-11: 500 mg via INTRAVENOUS
  Filled 2015-04-10 (×2): qty 500

## 2015-04-10 MED ORDER — VANCOMYCIN HCL IN DEXTROSE 1-5 GM/200ML-% IV SOLN
1000.0000 mg | Freq: Once | INTRAVENOUS | Status: AC
  Administered 2015-04-10: 1000 mg via INTRAVENOUS
  Filled 2015-04-10: qty 200

## 2015-04-10 NOTE — ED Provider Notes (Signed)
CSN: [id number]     Arrival date & time 04/10/15  1800 History   First MD Initiated Contact with Patient 04/10/15 2042     Chief Complaint  Patient presents with  . Abnormal Lab     (Consider location/radiation/quality/duration/timing/severity/associated sxs/prior Treatment) HPI Diane [name] is a 80 year old female with past medical history of ESRD, on dialysis Tuesday, Thursday, Saturday, bipolar disorder, iron deficiency anemia, CAD who presents the ER after being informed she had a positive blood culture. Patient reports on Saturday she recalls noticing a small amount of "infection in her arm and ". Which she describes some mild purulent discharge around his dialysis site. Patient states that yesterday she was at dinner, when she had spontaneous bleeding of her dialysis graft. Patient was seen and evaluated in the emergency Department for same, evaluated by vascular surgery, had hemostasis achieved, placed on oral anabiotic and discharged to follow up with vascular surgery as outpatient. Blood cultures were drawn at that time. Today blood cultures both resulted with gram-positive cocci, and patient was asked to return to the ER for further evaluation. Other than patient describing a small amount of purulent discharge on Saturday around her house is graft, patient denies having any constitutional symptoms. She does endorse having some mild chills during hemodialysis, however denies fever, headache, dizziness, weakness, lightheadedness, chest pain, shortness of breath, nausea, vomiting, abdominal pain, dysuria.  Past Medical History  Diagnosis Date  . Anemia in chronic kidney disease(285.21)   . Bipolar disorder, unspecified   . Iron deficiency anemia, unspecified   . Secondary hyperparathyroidism (of renal origin)   . Restless legs syndrome (RLS)   . Unspecified hypertensive kidney disease with chronic kidney disease stage V or end stage renal disease   . 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 Personal history of colonic polyps   . Diverticulosis of colon (without mention of hemorrhage) 2006    Colonoscopy Dr. Oletta Lamas  . Coronary artery disease   . Anginal pain   . S/P CABG x 5, 08/15/12, LIMA-LAD;VG-diag;VG-OM; seq VG-PDA,PLA  08/18/2012  . Anxiety   . Depression    Past Surgical History  Procedure Laterality Date  . Arteriovenous graft placement  11/2010    left upper - AVGG Dr. Donnetta Hutching  . Arteriovenous graft placement  12/2009    left lower - AVGG Dr. Donnetta Hutching  . Av fistula placement  07/2009    right upper AVF Dr. Donnetta Hutching  . Av fistula placement  01/2002    right lower Dr. Donnetta Hutching  . Cardiac catheterization  08/12/2012  . Abdominal hysterectomy    . Coronary artery bypass graft  08/14/2012    Procedure: CORONARY ARTERY BYPASS GRAFTING (CABG);  Surgeon: Gaye Pollack, MD;  Location: [facility];  Service: Open Heart Surgery;  Laterality: N/A;  times five, using left internal mammary  . Endovein harvest of greater saphenous vein  08/14/2012    Procedure: ENDOVEIN HARVEST OF GREATER SAPHENOUS VEIN;  Surgeon: Gaye Pollack, MD;  Location: [facility];  Service: Open Heart Surgery;  Laterality: Bilateral;  . Eye surgery    . Left and right heart catheterization with coronary/graft angiogram  08/12/2012    Procedure: LEFT AND RIGHT HEART CATHETERIZATION WITH Beatrix Fetters;  Surgeon: Troy Sine, MD;  Location: [facility] CATH LAB;  Service: Cardiovascular;;  . Revision of arteriovenous goretex graft Left 02/27/2015    Procedure: EXCISION OF ULCER AND REVISION OF LEFT UPPER ARM  ARTERIOVENOUS GORETEX GRAFT;  Surgeon: Rosetta Posner, MD;  Location: [facility];  Service: Vascular;  Laterality: Left;   No family history on file. History  Substance Use Topics  . Smoking status: Never Smoker   . Smokeless tobacco: Never Used  . Alcohol Use: No   OB History    No data available     Review of Systems  Constitutional: Negative for  fever.  HENT: Negative for trouble swallowing.   Eyes: Negative for visual disturbance.  Respiratory: Negative for shortness of breath.   Cardiovascular: Negative for chest pain.  Gastrointestinal: Negative for nausea, vomiting and abdominal pain.  Genitourinary: Negative for dysuria.  Musculoskeletal: Negative for neck pain.  Skin: Negative for rash.  Neurological: Negative for dizziness, weakness and numbness.  Psychiatric/Behavioral: Negative.       Allergies  Ambien; Amoxicillin; Clavulanic acid; Sulfa antibiotics; Latex; and Tape  Home Medications   Prior to Admission medications   Medication Sig Start Date End Date Taking? Authorizing Provider  acetaminophen (TYLENOL) 500 MG tablet Take 500 mg by mouth every 6 (six) hours as needed for mild pain or moderate pain.   Yes Historical Provider, MD  aspirin 81 MG tablet Take 81 mg by mouth See admin instructions. Only take on mondays, wed, fridays, sundays   Yes Historical Provider, MD  calcium acetate (PHOSLO) 667 MG capsule Take 667 mg by mouth 3 (three) times daily with meals.  07/05/13  Yes Historical Provider, MD  clorazepate (TRANXENE) 3.75 MG tablet Take 3.75 mg by mouth 2 (two) times daily as needed for anxiety.    Yes Historical Provider, MD  gabapentin (NEURONTIN) 100 MG capsule Take 200 mg by mouth at bedtime.    Yes Historical Provider, MD  hydrOXYzine (ATARAX/VISTARIL) 50 MG tablet Take 50 mg by mouth at bedtime.  09/04/12  Yes Historical Provider, MD  multivitamin (RENA-VIT) TABS tablet Take 1 tablet by mouth daily.  11/29/14  Yes Historical Provider, MD  NITROSTAT 0.4 MG SL tablet Place 0.4 mg under the tongue every 5 (five) minutes as needed.  07/07/12  Yes Historical Provider, MD  polyethylene glycol (MIRALAX / GLYCOLAX) packet Take 17 g by mouth daily as needed. For constipation   Yes Historical Provider, MD  Polyvinyl Alcohol-Povidone (REFRESH OP) Place 1 tablet into both eyes daily as needed. For dry eyes   Yes  Historical Provider, MD  Propylene Glycol 0.6 % SOLN Apply 1 drop to eye daily as needed (allergies).   Yes Historical Provider, MD  doxycycline (VIBRAMYCIN) 100 MG capsule Take 1 capsule (100 mg total) by mouth 2 (two) times daily. Patient not taking: Reported on 04/10/2015 04/09/15   Okey Regal, PA-C  oxyCODONE-acetaminophen (ROXICET) 5-325 MG per tablet Take 1 tablet by mouth every 6 (six) hours as needed for severe pain. Patient not taking: Reported on 03/14/2015 02/27/15   Joelene Millin A Trinh, PA-C   BP 128/55 mmHg  Pulse 83  Temp(Src) 97.3 F (36.3 C) (Oral)  Resp 18  Ht 5\' 1"  (1.549 m)  Wt 116 lb 3 oz (52.702 kg)  BMI 21.96 kg/m2  SpO2 100% Physical Exam  Constitutional: She is oriented to person, place, and time. She appears well-developed and well-nourished. No distress.  HENT:  Head: Normocephalic and atraumatic.  Mouth/Throat: Oropharynx is clear and moist. No oropharyngeal exudate.  Eyes: Right eye exhibits no discharge. Left eye exhibits no discharge. No scleral icterus.  Neck: Normal range of motion.  Cardiovascular: Normal rate, regular rhythm and normal heart sounds.  No murmur heard. Pulmonary/Chest: Effort normal and breath sounds normal. No respiratory distress.  Abdominal: Soft. There is no tenderness.  Musculoskeletal: Normal range of motion. She exhibits no edema or tenderness.  Neurological: She is alert and oriented to person, place, and time. No cranial nerve deficit. Coordination normal.  Skin: Skin is warm and dry. No rash noted. She is not diaphoretic.  Well-appearing, mild ecchymosis around dialysis graft in left arm. There is dressing that is applied, non-pressure. No obvious surrounding erythema, purulent discharge or drainage. No clear signs of dehiscence.  Psychiatric: She has a normal mood and affect.  Nursing note and vitals reviewed.   ED Course  Procedures (including critical care time) Labs Review Labs Reviewed  CBC WITH DIFFERENTIAL/PLATELET  - Abnormal; Notable for the following:    RBC 3.55 (*)    Hemoglobin 10.7 (*)    HCT 32.7 (*)    Platelets 149 (*)    All other components within normal limits  COMPREHENSIVE METABOLIC PANEL - Abnormal; Notable for the following:    Glucose, Bld 161 (*)    BUN 40 (*)    Creatinine, Ser 4.81 (*)    Total Protein 6.3 (*)    AST 77 (*)    ALT 84 (*)    GFR calc non Af Amer 8 (*)    GFR calc Af Amer 9 (*)    All other components within normal limits  URINALYSIS, ROUTINE W REFLEX MICROSCOPIC (NOT AT [facility])    Imaging Review Dg Chest 2 View  04/10/2015   CLINICAL DATA:  Positive blood cultures.  EXAM: CHEST  2 VIEW  COMPARISON:  12/13/2013  FINDINGS: Prior CABG. Heart is normal size. No confluent airspace opacities or effusions. No acute bony abnormality.  IMPRESSION: No active cardiopulmonary disease.   Electronically Signed   By: Rolm Baptise M.D.   On: 04/10/2015 21:52     EKG Interpretation None      MDM   Final diagnoses:  Positive blood cultures   Patient here with bacteremia, secondary most likely to dialysis graft site. No obvious signs of infection on exam, however patient reports subjective purulent drainage from their site several days ago. Patient does not have any systemic signs or symptoms. Patient afebrile, hemodynamically stable and in no acute distress. No signs of sepsis. We'll plan for admission, we'll place patient on IV vancomycin.  10:30 PM: Spoke with Dr. Donnetta Hutching with Vascular Surgery in consultation of pt's case.  Dr. Donnetta Hutching agrees with plan to admit to medicine for bacteremia.   Spoke with Dr. Wendee Beavers with hospitalist who agrees to admit her to medicine for bacteremia.  BP 128/55 mmHg  Pulse 83  Temp(Src) 97.3 F (36.3 C) (Oral)  Resp 18  Ht 5\' 1"  (1.549 m)  Wt 116 lb 3 oz (52.702 kg)  BMI 21.96 kg/m2  SpO2 100%  Signed,  Dahlia Bailiff, PA-C 11:46 PM  Patient discussed with Dr. Sherwood Gambler, MD   Dahlia Bailiff, PA-C 04/10/15 2346  Sherwood Gambler,  MD 04/14/15 1034

## 2015-04-10 NOTE — Telephone Encounter (Signed)
Sent msg for Dr Scot Dock to ask if she could be added on 07/01 instead due to scheduling conflicts, dpm

## 2015-04-10 NOTE — Consult Note (Signed)
ANTIBIOTIC CONSULT NOTE - INITIAL  Pharmacy Consult for Vancomycin Indication: bacteremia  Allergies  Allergen Reactions  . Ambien [Zolpidem Tartrate]     Stays awake  . Amoxicillin Diarrhea  . Clavulanic Acid   . Sulfa Antibiotics Other (See Comments)    Migraine   . Latex Rash  . Tape Rash    Can only use paper tape     Patient Measurements: Height: 5\' 1"  (154.9 cm) Weight: 116 lb 3 oz (52.702 kg) IBW/kg (Calculated) : 47.8  Vital Signs: Temp: 97.3 F (36.3 C) (06/20 1808) Temp Source: Oral (06/20 1808) BP: 128/55 mmHg (06/20 2044) Pulse Rate: 83 (06/20 2044) Intake/Output from previous day:   Intake/Output from this shift:    Labs:  Recent Labs  04/09/15 2211 04/10/15 1824  WBC 9.6 9.1  HGB 11.2* 10.7*  PLT 158 149*  CREATININE 4.23* 4.81*   Estimated Creatinine Clearance: 7.3 mL/min (by C-G formula based on Cr of 4.81).   Microbiology: Recent Results (from the past 720 hour(s))  Blood Culture (routine x 2)     Status: None (Preliminary result)   Collection Time: 04/09/15 10:00 PM  Result Value Ref Range Status   Specimen Description BLOOD RIGHT ARM  Final   Special Requests BOTTLES DRAWN AEROBIC ONLY [place]  Final   Culture  Setup Time   Final    GRAM POSITIVE COCCI IN CLUSTERS AEROBIC BOTTLE ONLY CRITICAL RESULT CALLED TO, READ BACK BY AND VERIFIED WITH: L MILLER 04/10/15 @ 4 M VESTAL    Culture GRAM POSITIVE COCCI  Final   Report Status PENDING  Incomplete  Blood Culture (routine x 2)     Status: None (Preliminary result)   Collection Time: 04/09/15 10:22 PM  Result Value Ref Range Status   Specimen Description BLOOD RIGHT ARM  Final   Special Requests BOTTLES DRAWN AEROBIC AND ANAEROBIC 5CC EACH  Final   Culture  Setup Time   Final    GRAM POSITIVE COCCI IN CLUSTERS IN BOTH AEROBIC AND ANAEROBIC BOTTLES CRITICAL RESULT CALLED TO, READ BACK BY AND VERIFIED WITH: L MILLER 04/10/15 @ 62 M VESTAL    Culture GRAM POSITIVE COCCI  Final   Report  Status PENDING  Incomplete    Medical History: Past Medical History  Diagnosis Date  . Anemia in chronic kidney disease(285.21)   . Bipolar disorder, unspecified   . Iron deficiency anemia, unspecified   . Secondary hyperparathyroidism (of renal origin)   . Restless legs syndrome (RLS)   . Unspecified hypertensive kidney disease with chronic kidney disease stage V or end stage renal disease   . 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 Personal history of colonic polyps   . Diverticulosis of colon (without mention of hemorrhage) 2006    Colonoscopy Dr. Oletta Lamas  . Coronary artery disease   . Anginal pain   . S/P CABG x 5, 08/15/12, LIMA-LAD;VG-diag;VG-OM; seq VG-PDA,PLA  08/18/2012  . Anxiety   . Depression    Assessment: Diane [name] with ESRD on HD TTS was seen in the ED yesterday for an AV graft infection. Blood cultures were drawn and she was sent home on oral doxycycline. Blood cultures are now growing gram positive cocci 2/2. Patient was told to come back to the ED. She will begin vancomycin.   Goal of Therapy:  Pre HD vancomycin 15-25  Plan:  1) Vancomycin 1000mg  IV x 1 then 500mg  IV qHD 2) Follow HD  schedule/tolerance, cultures, LOT, level if needed  [name] 04/10/2015,9:32 PM

## 2015-04-10 NOTE — ED Notes (Signed)
Graft on left arm has new gauze placed and secured with coban. Verified good circulation distal to the bandage. Rapid cap refill, and sensation intact.

## 2015-04-10 NOTE — H&P (Signed)
[facility] History and Physical  GENEVIE ELMAN QKM:[id number] DOB: 12-01-1934 DOA: 04/10/2015  Referring personnel: Dahlia Bailiff PCP: Donnajean Lopes, MD   Chief Complaint: chills  HPI: Diane [name] is a 80 y.o. female  With history of end-stage renal disease on dialysis Tuesday Thursdays and Saturdays. States that she recently had some work done on her left axis site roughly a month ago. Since then she has noticed recently within the last several days some discharge from her site. Blood cultures were obtained. And recently resulted positive both of them as such patient was referred to the ER for further evaluation recommendations. Currently patient's only complaint is chills. The problem has been persistent since onset and getting worse. Nothing she is aware of makes it better or worse.    Review of Systems:  Constitutional:  No weight loss, night sweats, Fevers, + chills, + fatigue.  HEENT:  No headaches, Difficulty swallowing,Tooth/dental problems,Sore throat,  No sneezing, itching, ear ache, nasal congestion, post nasal drip,  Cardio-vascular:  No chest pain, Orthopnea, PND, swelling in lower extremities, anasarca, dizziness, palpitations  GI:  No heartburn, indigestion, abdominal pain, nausea, vomiting, diarrhea, change in bowel habits, loss of appetite  Resp:  No shortness of breath with exertion or at rest. No excess mucus, no productive cough, No non-productive cough, No coughing up of blood.No change in color of mucus.No wheezing.No chest wall deformity  Skin:  no rash or lesions.  GU:  no dysuria, change in color of urine, no urgency or frequency. No flank pain.  Musculoskeletal:  No joint pain or swelling. No decreased range of motion. No back pain.  Psych:  No change in mood or affect. No depression or anxiety. No memory loss.   Past Medical History  Diagnosis Date  . Anemia in chronic kidney disease(285.21)   . Bipolar disorder, unspecified   .  Iron deficiency anemia, unspecified   . Secondary hyperparathyroidism (of renal origin)   . Restless legs syndrome (RLS)   . Unspecified hypertensive kidney disease with chronic kidney disease stage V or end stage renal disease   . 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 Personal history of colonic polyps   . Diverticulosis of colon (without mention of hemorrhage) 2006    Colonoscopy Dr. Oletta Lamas  . Coronary artery disease   . Anginal pain   . S/P CABG x 5, 08/15/12, LIMA-LAD;VG-diag;VG-OM; seq VG-PDA,PLA  08/18/2012  . Anxiety   . Depression    Past Surgical History  Procedure Laterality Date  . Arteriovenous graft placement  11/2010    left upper - AVGG Dr. Donnetta Hutching  . Arteriovenous graft placement  12/2009    left lower - AVGG Dr. Donnetta Hutching  . Av fistula placement  07/2009    right upper AVF Dr. Donnetta Hutching  . Av fistula placement  01/2002    right lower Dr. Donnetta Hutching  . Cardiac catheterization  08/12/2012  . Abdominal hysterectomy    . Coronary artery bypass graft  08/14/2012    Procedure: CORONARY ARTERY BYPASS GRAFTING (CABG);  Surgeon: Gaye Pollack, MD;  Location: [place];  Service: Open Heart Surgery;  Laterality: N/A;  times five, using left internal mammary  . Endovein harvest of greater saphenous vein  08/14/2012    Procedure: ENDOVEIN HARVEST OF GREATER SAPHENOUS VEIN;  Surgeon: Gaye Pollack, MD;  Location: [place];  Service: Open Heart Surgery;  Laterality: Bilateral;  . Eye surgery    .  Left and right heart catheterization with coronary/graft angiogram  08/12/2012    Procedure: LEFT AND RIGHT HEART CATHETERIZATION WITH Beatrix Fetters;  Surgeon: Troy Sine, MD;  Location: [facility] C[facility] LAB;  Service: Cardiovascular;;  . Revision of arteriovenous goretex graft Left 02/27/2015    Procedure: EXCISION OF ULCER AND REVISION OF LEFT UPPER ARM ARTERIOVENOUS GORETEX GRAFT;  Surgeon: Rosetta Posner, MD;  Location: [place];  Service:  Vascular;  Laterality: Left;   Social History:  reports that she has never smoked. She has never used smokeless tobacco. She reports that she does not drink alcohol or use illicit drugs.  Allergies  Allergen Reactions  . Ambien [Zolpidem Tartrate]     Stays awake  . Amoxicillin Diarrhea  . Clavulanic Acid   . Sulfa Antibiotics Other (See Comments)    Migraine   . Latex Rash  . Tape Rash    Can only use paper tape     Family history Mother was blind several years prior to passing away reportedly.   Prior to Admission medications   Medication Sig Start Date End Date Taking? Authorizing Provider  acetaminophen (TYLENOL) 500 MG tablet Take 500 mg by mouth every 6 (six) hours as needed for mild pain or moderate pain.   Yes Historical Provider, MD  aspirin 81 MG tablet Take 81 mg by mouth See admin instructions. Only take on mondays, wed, fridays, sundays   Yes Historical Provider, MD  calcium acetate (PHOSLO) 667 MG capsule Take 667 mg by mouth 3 (three) times daily with meals.  07/05/13  Yes Historical Provider, MD  clorazepate (TRANXENE) 3.75 MG tablet Take 3.75 mg by mouth 2 (two) times daily as needed for anxiety.    Yes Historical Provider, MD  gabapentin (NEURONTIN) 100 MG capsule Take 200 mg by mouth at bedtime.    Yes Historical Provider, MD  hydrOXYzine (ATARAX/VISTARIL) 50 MG tablet Take 50 mg by mouth at bedtime.  09/04/12  Yes Historical Provider, MD  multivitamin (RENA-VIT) TABS tablet Take 1 tablet by mouth daily.  11/29/14  Yes Historical Provider, MD  NITROSTAT 0.4 MG SL tablet Place 0.4 mg under the tongue every 5 (five) minutes as needed.  07/07/12  Yes Historical Provider, MD  polyethylene glycol (MIRALAX / GLYCOLAX) packet Take 17 g by mouth daily as needed. For constipation   Yes Historical Provider, MD  Polyvinyl Alcohol-Povidone (REFRESH OP) Place 1 tablet into both eyes daily as needed. For dry eyes   Yes Historical Provider, MD  Propylene Glycol 0.6 % SOLN Apply 1 drop  to eye daily as needed (allergies).   Yes Historical Provider, MD  doxycycline (VIBRAMYCIN) 100 MG capsule Take 1 capsule (100 mg total) by mouth 2 (two) times daily. Patient not taking: Reported on 04/10/2015 04/09/15   Okey Regal, PA-C  oxyCODONE-acetaminophen (ROXICET) 5-325 MG per tablet Take 1 tablet by mouth every 6 (six) hours as needed for severe pain. Patient not taking: Reported on 03/14/2015 02/27/15   Alvia Grove, PA-C   Physical Exam: Filed Vitals:   04/10/15 1808 04/10/15 2044  BP: 122/59 128/55  Pulse: 91 83  Temp: 97.3 F (36.3 C)   TempSrc: Oral   Resp: 16 18  Height: 5\' 1"  (1.549 m)   Weight: 52.702 kg (116 lb 3 oz)   SpO2: 97% 100%    Wt Readings from Last 3 Encounters:  04/10/15 52.702 kg (116 lb 3 oz)  04/09/15 53.524 kg (118 lb)  04/05/15 53.524 kg (118 lb)  General:  Appears calm and comfortable Eyes: PERRL, normal lids, irises & conjunctiva ENT: grossly normal hearing, lips & tongue Neck: no LAD, masses or thyromegaly Cardiovascular: RRR, no m/r/g. No LE edema. Respiratory: CTA bilaterally, no w/r/r. Normal respiratory effort. Abdomen: soft, nt, nd Skin: no rash or induration seen on limited exam, dressing over left access site with no obvious purulent discharge Musculoskeletal: grossly normal tone BUE/BLE Psychiatric: grossly normal mood and affect, speech fluent and appropriate Neurologic: grossly non-focal.          Labs on Admission:  Basic Metabolic Panel:  Recent Labs Lab 04/09/15 2211 04/10/15 1824  NA 141 141  K 3.5 4.6  CL 100* 106  CO2 27 25  GLUCOSE 148* 161*  BUN 29* 40*  CREATININE 4.23* 4.81*  CALCIUM 9.3 9.3   Liver Function Tests:  Recent Labs Lab 04/09/15 2211 04/10/15 1824  AST 81* 77*  ALT 85* 84*  ALKPHOS 75 75  BILITOT 0.2* 0.3  PROT 6.3* 6.3*  ALBUMIN 3.4* 3.5   No results for input(s): LIPASE, AMYLASE in the last 168 hours. No results for input(s): AMMONIA in the last 168 hours. CBC:  Recent  Labs Lab 04/09/15 2211 04/10/15 1824  WBC 9.6 9.1  NEUTROABS 5.5 5.7  HGB 11.2* 10.7*  HCT 33.6* 32.7*  MCV 93.1 92.1  PLT 158 149*   Cardiac Enzymes: No results for input(s): CKTOTAL, CKMB, CKMBINDEX, TROPONINI in the last 168 hours.  BNP (last 3 results) No results for input(s): BNP in the last 8760 hours.  ProBNP (last 3 results) No results for input(s): PROBNP in the last 8760 hours.  CBG: No results for input(s): GLUCAP in the last 168 hours.  Radiological Exams on Admission: Dg Chest 2 View  04/10/2015   CLINICAL DATA:  Positive blood cultures.  EXAM: CHEST  2 VIEW  COMPARISON:  12/13/2013  FINDINGS: Prior CABG. Heart is normal size. No confluent airspace opacities or effusions. No acute bony abnormality.  IMPRESSION: No active cardiopulmonary disease.   Electronically Signed   By: Rolm Baptise M.D.   On: 04/10/2015 21:52    EKG: Independently reviewed. Sinus rhythm with no ST elevations or depressions  Assessment/Plan Principal Problem:   Bacteremia -  placed on vancomycin pharmacy to dose  - We'll await final results of blood culture at this juncture per my discussion with the ED personnel both bottles were positive for gram-positive cocci  - Obtain echocardiogram  - Currently not meeting sepsis criteria.   Active Problems:   CAD (coronary artery disease): Severe three vessel - Stable no chest pain     ESRD (end stage renal disease) on dialysis - Patient gets dialysis Tuesday Thursdays and Saturdays - Consult nephrology next a.m. for evaluation and management of dialysis    Code Status: Full DVT Prophylaxis: Heparin Family Communication: Discussed with patient and daughter at bedside Disposition Plan: Pending further evaluation and recommendations by specialist  Time spent: > 55 minutes  Velvet Bathe [facility] Pager [phone]

## 2015-04-10 NOTE — ED Notes (Signed)
Pt states that she was sent here for eval of "infection in blood work" from yesterday. Pt had recent graft surgery on left arm.

## 2015-04-10 NOTE — Telephone Encounter (Signed)
-----   Message from Mena Goes, RN sent at 04/10/2015  9:58 AM EDT ----- Regarding: Schedule   ----- Message -----    From: Angelia Mould, MD    Sent: 04/09/2015  11:23 PM      To: [facility] Charge Pool Subject: charge and f/u                                 33 consult. She will need a follow up visit with me a week from Wednesday to check on her left upper arm graft. Thank you. CD

## 2015-04-10 NOTE — Progress Notes (Signed)
ER RN called for report. Room ready for admit.

## 2015-04-10 NOTE — ED Notes (Signed)
Wendee Beavers, MD at bedside.

## 2015-04-10 NOTE — Telephone Encounter (Signed)
Spoke with patient and daughter re need to return to ED for reeval, possible antibiotics, possible admission, explained importance and severity to patient and daughter of needing to return today, verbalizes understanding of need to return today. Per Verbal telephone instructions of Dr. Cathleen Fears

## 2015-04-11 ENCOUNTER — Encounter (HOSPITAL_COMMUNITY): Payer: Self-pay | Admitting: *Deleted

## 2015-04-11 ENCOUNTER — Inpatient Hospital Stay (HOSPITAL_COMMUNITY): Payer: Medicare Other

## 2015-04-11 DIAGNOSIS — I2510 Atherosclerotic heart disease of native coronary artery without angina pectoris: Secondary | ICD-10-CM

## 2015-04-11 DIAGNOSIS — B9561 Methicillin susceptible Staphylococcus aureus infection as the cause of diseases classified elsewhere: Secondary | ICD-10-CM

## 2015-04-11 DIAGNOSIS — Y838 Other surgical procedures as the cause of abnormal reaction of the patient, or of later complication, without mention of misadventure at the time of the procedure: Secondary | ICD-10-CM

## 2015-04-11 DIAGNOSIS — N186 End stage renal disease: Secondary | ICD-10-CM

## 2015-04-11 DIAGNOSIS — Z992 Dependence on renal dialysis: Secondary | ICD-10-CM

## 2015-04-11 DIAGNOSIS — R7881 Bacteremia: Secondary | ICD-10-CM

## 2015-04-11 DIAGNOSIS — T827XXA Infection and inflammatory reaction due to other cardiac and vascular devices, implants and grafts, initial encounter: Principal | ICD-10-CM

## 2015-04-11 LAB — URINALYSIS, ROUTINE W REFLEX MICROSCOPIC
Bilirubin Urine: NEGATIVE
Glucose, UA: 100 mg/dL — AB
Ketones, ur: NEGATIVE mg/dL
Leukocytes, UA: NEGATIVE
Nitrite: NEGATIVE
PH: 8 (ref 5.0–8.0)
Protein, ur: NEGATIVE mg/dL
Specific Gravity, Urine: 1.005 (ref 1.005–1.030)
Urobilinogen, UA: 0.2 mg/dL (ref 0.0–1.0)

## 2015-04-11 LAB — BASIC METABOLIC PANEL
ANION GAP: 10 (ref 5–15)
BUN: 43 mg/dL — ABNORMAL HIGH (ref 6–20)
CO2: 22 mmol/L (ref 22–32)
Calcium: 9.4 mg/dL (ref 8.9–10.3)
Chloride: 113 mmol/L — ABNORMAL HIGH (ref 101–111)
Creatinine, Ser: 5.03 mg/dL — ABNORMAL HIGH (ref 0.44–1.00)
GFR calc Af Amer: 9 mL/min — ABNORMAL LOW (ref >60)
GFR calc non Af Amer: 7 mL/min — ABNORMAL LOW (ref >60)
Glucose, Bld: 97 mg/dL (ref 65–99)
Potassium: 4.2 mmol/L (ref 3.5–5.1)
Sodium: 145 mmol/L (ref 135–145)

## 2015-04-11 LAB — URINE MICROSCOPIC-ADD ON

## 2015-04-11 LAB — CBC
HEMATOCRIT: 36.1 % (ref 36.0–46.0)
HEMOGLOBIN: 11.8 g/dL — AB (ref 12.0–15.0)
MCH: 30.5 pg (ref 26.0–34.0)
MCHC: 32.7 g/dL (ref 30.0–36.0)
MCV: 93.3 fL (ref 78.0–100.0)
Platelets: 161 10*3/uL (ref 150–400)
RBC: 3.87 MIL/uL (ref 3.87–5.11)
RDW: 13.8 % (ref 11.5–15.5)
WBC: 10.1 10*3/uL (ref 4.0–10.5)

## 2015-04-11 LAB — GLUCOSE, CAPILLARY: Glucose-Capillary: 126 mg/dL — ABNORMAL HIGH (ref 65–99)

## 2015-04-11 LAB — MRSA PCR SCREENING: MRSA by PCR: NEGATIVE

## 2015-04-11 MED ORDER — SODIUM CHLORIDE 0.9 % IJ SOLN: 3.0000 mL | INTRAMUSCULAR | Status: DC | PRN

## 2015-04-11 MED ORDER — CALCIUM ACETATE 667 MG PO CAPS: 667.0000 mg | ORAL_CAPSULE | Freq: Three times a day (TID) | ORAL | Status: DC

## 2015-04-11 MED ORDER — SODIUM CHLORIDE 0.9 % IJ SOLN
3.0000 mL | Freq: Two times a day (BID) | INTRAMUSCULAR | Status: DC
  Administered 2015-04-11 – 2015-04-15 (×4): 3 mL via INTRAVENOUS

## 2015-04-11 MED ORDER — NITROGLYCERIN 0.4 MG SL SUBL: 0.4000 mg | SUBLINGUAL_TABLET | SUBLINGUAL | Status: DC | PRN

## 2015-04-11 MED ORDER — GABAPENTIN 100 MG PO CAPS
200.0000 mg | ORAL_CAPSULE | Freq: Every day | ORAL | Status: DC
  Administered 2015-04-11 – 2015-04-14 (×4): 200 mg via ORAL
  Filled 2015-04-11 (×6): qty 2

## 2015-04-11 MED ORDER — POLYETHYLENE GLYCOL 3350 17 G PO PACK
17.0000 g | PACK | Freq: Every day | ORAL | Status: DC | PRN
  Administered 2015-04-11: 17 g via ORAL
  Filled 2015-04-11 (×2): qty 1

## 2015-04-11 MED ORDER — CALCIUM ACETATE (PHOS BINDER) 667 MG PO CAPS
667.0000 mg | ORAL_CAPSULE | Freq: Three times a day (TID) | ORAL | Status: DC
  Administered 2015-04-11 – 2015-04-15 (×10): 667 mg via ORAL
  Filled 2015-04-11 (×15): qty 1

## 2015-04-11 MED ORDER — ACETAMINOPHEN 500 MG PO TABS
500.0000 mg | ORAL_TABLET | Freq: Four times a day (QID) | ORAL | Status: DC | PRN
  Administered 2015-04-14: 500 mg via ORAL
  Filled 2015-04-11: qty 1

## 2015-04-11 MED ORDER — POLYVINYL ALCOHOL 1.4 % OP SOLN
1.0000 [drp] | OPHTHALMIC | Status: DC | PRN
  Filled 2015-04-11: qty 15

## 2015-04-11 MED ORDER — HEPARIN SODIUM (PORCINE) 5000 UNIT/ML IJ SOLN
5000.0000 [IU] | Freq: Three times a day (TID) | INTRAMUSCULAR | Status: DC
  Administered 2015-04-11 – 2015-04-15 (×10): 5000 [IU] via SUBCUTANEOUS
  Filled 2015-04-11 (×15): qty 1

## 2015-04-11 MED ORDER — SODIUM CHLORIDE 0.9 % IV SOLN
250.0000 mL | INTRAVENOUS | Status: DC | PRN
  Administered 2015-04-12: 10:00:00 via INTRAVENOUS

## 2015-04-11 MED ORDER — RENA-VITE PO TABS
1.0000 | ORAL_TABLET | Freq: Every day | ORAL | Status: DC
  Administered 2015-04-11 – 2015-04-14 (×4): 1 via ORAL
  Filled 2015-04-11 (×6): qty 1

## 2015-04-11 MED ORDER — HYDROXYZINE HCL 50 MG PO TABS
50.0000 mg | ORAL_TABLET | Freq: Every day | ORAL | Status: DC
  Administered 2015-04-11 – 2015-04-14 (×5): 50 mg via ORAL
  Filled 2015-04-11 (×6): qty 1

## 2015-04-11 MED ORDER — ASPIRIN 81 MG PO TABS: 81.0000 mg | ORAL_TABLET | ORAL | Status: DC

## 2015-04-11 MED ORDER — PROPYLENE GLYCOL 0.6 % OP SOLN: 1.0000 [drp] | Freq: Every day | OPHTHALMIC | Status: DC | PRN

## 2015-04-11 MED ORDER — CLORAZEPATE DIPOTASSIUM 3.75 MG PO TABS
3.7500 mg | ORAL_TABLET | Freq: Two times a day (BID) | ORAL | Status: DC | PRN
  Administered 2015-04-11 – 2015-04-14 (×5): 3.75 mg via ORAL
  Filled 2015-04-11 (×5): qty 1

## 2015-04-11 MED ORDER — ASPIRIN 81 MG PO CHEW
81.0000 mg | CHEWABLE_TABLET | ORAL | Status: DC
  Administered 2015-04-14: 81 mg via ORAL
  Filled 2015-04-11: qty 1

## 2015-04-11 MED ORDER — OXYCODONE HCL 5 MG PO TABS: 5.0000 mg | ORAL_TABLET | Freq: Four times a day (QID) | ORAL | Status: DC | PRN

## 2015-04-11 NOTE — Consult Note (Signed)
[facility] Renal Consultation Note  Indication for Consultation:  Management of ESRD/hemodialysis; anemia, hypertension/volume and secondary hyperparathyroidism  HPI: Diane [name] is a 80 y.o. female admitted with Bacteremia (BC pos =grm pos cocci clusters/ 04/09/15 from [facility] ) infected L UA AVGG.. Past Sunday , 04/09/15 spontaneous bleed from [place] = She reports" peeled some tape off from her access Sunday night and had and had some bleeding from  her fistula and also note small amount of pus. Reports a normal HD Saturday at [facility]. Noted that Zion  Is  80 years old and was revised by Dr. Donnetta Hutching 02/27/2015 with  resection of an ulcerative area and replacement with an interposition 6 mm PTFE graft.She tells me now having chills and at home with subnormal temps. Denies pain at avgg, chest pain, sob, dizziness or any GI symptoms.          Seen by Dr. Donnetta Hutching this am a with noted plans for revision of [place] /attempt salvage of Avgg. Needs hd today on schedule using AVgg .       Past Medical History  Diagnosis Date  . Anemia in chronic kidney disease(285.21)   . Bipolar disorder, unspecified   . Iron deficiency anemia, unspecified   . Secondary hyperparathyroidism (of renal origin)   . Restless legs syndrome (RLS)   . Unspecified hypertensive kidney disease with chronic kidney disease stage V or end stage renal disease   . 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 Personal history of colonic polyps   . Diverticulosis of colon (without mention of hemorrhage) 2006    Colonoscopy Dr. Oletta Lamas  . Coronary artery disease   . Anginal pain   . S/P CABG x 5, 08/15/12, LIMA-LAD;VG-diag;VG-OM; seq VG-PDA,PLA  08/18/2012  . Anxiety   . Depression     Past Surgical History  Procedure Laterality Date  . Arteriovenous graft placement  11/2010    left upper - AVGG Dr. Donnetta Hutching  . Arteriovenous graft placement  12/2009   left lower - AVGG Dr. Donnetta Hutching  . Av fistula placement  07/2009    right upper AVF Dr. Donnetta Hutching  . Av fistula placement  01/2002    right lower Dr. Donnetta Hutching  . Cardiac catheterization  08/12/2012  . Abdominal hysterectomy    . Coronary artery bypass graft  08/14/2012    Procedure: CORONARY ARTERY BYPASS GRAFTING (CABG);  Surgeon: Gaye Pollack, MD;  Location: [place];  Service: Open Heart Surgery;  Laterality: N/A;  times five, using left internal mammary  . Endovein harvest of greater saphenous vein  08/14/2012    Procedure: ENDOVEIN HARVEST OF GREATER SAPHENOUS VEIN;  Surgeon: Gaye Pollack, MD;  Location: [place];  Service: Open Heart Surgery;  Laterality: Bilateral;  . Eye surgery    . Left and right heart catheterization with coronary/graft angiogram  08/12/2012    Procedure: LEFT AND RIGHT HEART CATHETERIZATION WITH Beatrix Fetters;  Surgeon: Troy Sine, MD;  Location: [facility] CATH LAB;  Service: Cardiovascular;;  . Revision of arteriovenous goretex graft Left 02/27/2015    Procedure: EXCISION OF ULCER AND REVISION OF LEFT UPPER ARM ARTERIOVENOUS GORETEX GRAFT;  Surgeon: Rosetta Posner, MD;  Location: [place];  Service: Vascular;  Laterality: Left;     History reviewed. No pertinent family history.    reports that she has never smoked. She has never used smokeless tobacco. She reports that she does  not drink alcohol or use illicit drugs.   Allergies  Allergen Reactions  . Ambien [Zolpidem Tartrate]     Stays awake  . Amoxicillin Diarrhea  . Clavulanic Acid   . Sulfa Antibiotics Other (See Comments)    Migraine   . Latex Rash  . Tape Rash    Can only use paper tape     Prior to Admission medications   Medication Sig Start Date End Date Taking? Authorizing Provider  acetaminophen (TYLENOL) 500 MG tablet Take 500 mg by mouth every 6 (six) hours as needed for mild pain or moderate pain.   Yes Historical Provider, MD  aspirin 81 MG tablet Take 81 mg by mouth See admin instructions. Only  take on mondays, wed, fridays, sundays   Yes Historical Provider, MD  calcium acetate (PHOSLO) 667 MG capsule Take 667 mg by mouth 3 (three) times daily with meals.  07/05/13  Yes Historical Provider, MD  clorazepate (TRANXENE) 3.75 MG tablet Take 3.75 mg by mouth 2 (two) times daily as needed for anxiety.    Yes Historical Provider, MD  gabapentin (NEURONTIN) 100 MG capsule Take 200 mg by mouth at bedtime.    Yes Historical Provider, MD  hydrOXYzine (ATARAX/VISTARIL) 50 MG tablet Take 50 mg by mouth at bedtime.  09/04/12  Yes Historical Provider, MD  multivitamin (RENA-VIT) TABS tablet Take 1 tablet by mouth daily.  11/29/14  Yes Historical Provider, MD  NITROSTAT 0.4 MG SL tablet Place 0.4 mg under the tongue every 5 (five) minutes as needed.  07/07/12  Yes Historical Provider, MD  polyethylene glycol (MIRALAX / GLYCOLAX) packet Take 17 g by mouth daily as needed. For constipation   Yes Historical Provider, MD  Polyvinyl Alcohol-Povidone (REFRESH OP) Place 1 tablet into both eyes daily as needed. For dry eyes   Yes Historical Provider, MD  Propylene Glycol 0.6 % SOLN Apply 1 drop to eye daily as needed (allergies).   Yes Historical Provider, MD  doxycycline (VIBRAMYCIN) 100 MG capsule Take 1 capsule (100 mg total) by mouth 2 (two) times daily. Patient not taking: Reported on 04/10/2015 04/09/15   Okey Regal, PA-C  oxyCODONE-acetaminophen (ROXICET) 5-325 MG per tablet Take 1 tablet by mouth every 6 (six) hours as needed for severe pain. Patient not taking: Reported on 03/14/2015 02/27/15   Alvia Grove, PA-C    UUV:OZDGUY chloride, acetaminophen, clorazepate, nitroGLYCERIN, oxyCODONE, polyethylene glycol, polyvinyl alcohol, sodium chloride  Results for orders placed or performed during the hospital encounter of 04/10/15 (from the past 48 hour(s))  CBC WITH DIFFERENTIAL     Status: Abnormal   Collection Time: 04/10/15  6:24 PM  Result Value Ref Range   WBC 9.1 4.0 - 10.5 K/uL   RBC 3.55 (L) 3.87  - 5.11 MIL/uL   Hemoglobin 10.7 (L) 12.0 - 15.0 g/dL   HCT 32.7 (L) 36.0 - 46.0 %   MCV 92.1 78.0 - 100.0 fL   [name] 30.1 26.0 - 34.0 pg   MCHC 32.7 30.0 - 36.0 g/dL   RDW 13.5 11.5 - 15.5 %   Platelets 149 (L) 150 - 400 K/uL   Neutrophils Relative % 63 43 - 77 %   Neutro Abs 5.7 1.7 - 7.7 K/uL   Lymphocytes Relative 28 12 - 46 %   Lymphs Abs 2.5 0.7 - 4.0 K/uL   Monocytes Relative 6 3 - 12 %   Monocytes Absolute 0.5 0.1 - 1.0 K/uL   Eosinophils Relative 4 0 - 5 %  Eosinophils Absolute 0.3 0.0 - 0.7 K/uL   Basophils Relative 1 0 - 1 %   Basophils Absolute 0.1 0.0 - 0.1 K/uL  Comprehensive metabolic panel     Status: Abnormal   Collection Time: 04/10/15  6:24 PM  Result Value Ref Range   Sodium 141 135 - 145 mmol/L   Potassium 4.6 3.5 - 5.1 mmol/L    Comment: SLIGHT HEMOLYSIS   Chloride 106 101 - 111 mmol/L   CO2 25 22 - 32 mmol/L   Glucose, Bld 161 (H) 65 - 99 mg/dL   BUN 40 (H) 6 - 20 mg/dL   Creatinine, Ser 4.81 (H) 0.44 - 1.00 mg/dL   Calcium 9.3 8.9 - 10.3 mg/dL   Total Protein 6.3 (L) 6.5 - 8.1 g/dL   Albumin 3.5 3.5 - 5.0 g/dL   AST 77 (H) 15 - 41 U/L   ALT 84 (H) 14 - 54 U/L   Alkaline Phosphatase 75 38 - 126 U/L   Total Bilirubin 0.3 0.3 - 1.2 mg/dL   GFR calc non Af Amer 8 (L) >60 mL/min   GFR calc Af Amer 9 (L) >60 mL/min    Comment: (NOTE) The eGFR has been calculated using the CKD EPI equation. This calculation has not been validated in all clinical situations. eGFR's persistently <60 mL/min signify possible Chronic Kidney Disease.    Anion gap 10 5 - 15  Glucose, capillary     Status: Abnormal   Collection Time: 04/11/15 12:13 AM  Result Value Ref Range   Glucose-Capillary 126 (H) 65 - 99 mg/dL  Urinalysis, Routine w reflex microscopic (not at [facility])     Status: Abnormal   Collection Time: 04/11/15  1:02 AM  Result Value Ref Range   Color, Urine YELLOW YELLOW   APPearance CLEAR CLEAR   Specific Gravity, Urine 1.005 1.005 - 1.030   pH 8.0 5.0 - 8.0    Glucose, UA 100 (A) NEGATIVE mg/dL   Hgb urine dipstick TRACE (A) NEGATIVE   Bilirubin Urine NEGATIVE NEGATIVE   Ketones, ur NEGATIVE NEGATIVE mg/dL   Protein, ur NEGATIVE NEGATIVE mg/dL   Urobilinogen, UA 0.2 0.0 - 1.0 mg/dL   Nitrite NEGATIVE NEGATIVE   Leukocytes, UA NEGATIVE NEGATIVE  Urine microscopic-add on     Status: None   Collection Time: 04/11/15  1:02 AM  Result Value Ref Range   Squamous Epithelial / LPF RARE RARE   WBC, UA 0-2 <3 WBC/hpf   RBC / HPF 0-2 <3 RBC/hpf   Bacteria, UA RARE RARE  MRSA PCR Screening     Status: None   Collection Time: 04/11/15  1:04 AM  Result Value Ref Range   MRSA by PCR NEGATIVE NEGATIVE    Comment:        The GeneXpert MRSA Assay (FDA approved for NASAL specimens only), is one component of a comprehensive MRSA colonization surveillance program. It is not intended to diagnose MRSA infection nor to guide or monitor treatment for MRSA infections.   Basic metabolic panel     Status: Abnormal   Collection Time: 04/11/15  7:08 AM  Result Value Ref Range   Sodium 145 135 - 145 mmol/L   Potassium 4.2 3.5 - 5.1 mmol/L   Chloride 113 (H) 101 - 111 mmol/L   CO2 22 22 - 32 mmol/L   Glucose, Bld 97 65 - 99 mg/dL   BUN 43 (H) 6 - 20 mg/dL   Creatinine, Ser 5.03 (H) 0.44 - 1.00 mg/dL  Calcium 9.4 8.9 - 10.3 mg/dL   GFR calc non Af Amer 7 (L) >60 mL/min   GFR calc Af Amer 9 (L) >60 mL/min    Comment: (NOTE) The eGFR has been calculated using the CKD EPI equation. This calculation has not been validated in all clinical situations. eGFR's persistently <60 mL/min signify possible Chronic Kidney Disease.    Anion gap 10 5 - 15  CBC     Status: Abnormal   Collection Time: 04/11/15  7:08 AM  Result Value Ref Range   WBC 10.1 4.0 - 10.5 K/uL   RBC 3.87 3.87 - 5.11 MIL/uL   Hemoglobin 11.8 (L) 12.0 - 15.0 g/dL   HCT 36.1 36.0 - 46.0 %   MCV 93.3 78.0 - 100.0 fL   [name] 30.5 26.0 - 34.0 pg   MCHC 32.7 30.0 - 36.0 g/dL   RDW 13.8 11.5 -  15.5 %   Platelets 161 150 - 400 K/uL     ROS: see hpi for positives  Physical Exam: Filed Vitals:   04/11/15 0816  BP: 103/55  Pulse: 77  Temp: 97.6 F (36.4 C)  Resp: 17     General: alert  Thin elderly WF, NAD, Nl MS for her = somewhat talkative HEENT:[place] , MMM, Nonicteric Neck: Supple no jvd Heart: RRR, no nur, gallop ior rub Lungs: CTA bilat, non labored breathing Abdomen: BS pos. Soft, NT, ND Extremities: no pedal edama Skin: no overt rash/ L U Arm AVGG with scabbing area lower portion of graft with mild erythema  Neuro alert Nl MS no acute focal deficits noted Dialysis Access: L UA AVGG pos bruit   Dialysis Orders: Center: [facility]   on TTS. EDW 53.5 HD Bath 2k/ 2.25 ca  Time 3.5 hr Heparin 3000. Access LUA AVGG   Last Mircera 03/07/15  Units IV/HD  Venofer  0  Other op labs = 04/06/15  hgb 11.4  Ca 9.6 phos 5.2 pth 149  Assessment/Plan 1. Bacterema (grm pos cocci in clusters = [facility] 04/09/15  [name])/ presumed sec to Infect Luarm Avgg lower portion of graft= On iv antibiotics (vanc) and Dr. Donnetta Hutching consulting  2. ESRD -  TTS HD  [facility]  = HD today use upper portion avg  And no hep-  3. Hypertension/volume  - bp 103/55 no bp meds as op  4. Anemia  - hgb 10.1 no esa or fe op lab  Fu cbcs 5. Metabolic bone disease -   9.4 Ca / phoslo binder  No vit d  6. Nutrition - alb 3.5  Renal diet and renal vit   Ernest Haber, PA-C [place] [phone] 04/11/2015, 9:27 AM   Patient seen and examined, agree with above note with above modifications. ESRD pt with what appears to be an infected portion of her AVG- using graft with guidance from vascular today then to OR tomorrow- he is hoping AVG will be able to be salvaged.  [name], MD 04/11/2015

## 2015-04-11 NOTE — Progress Notes (Signed)
[facility] PROGRESS NOTE  Diane [name] WRU:[id number] DOB: 06-05-1935 DOA: 04/10/2015 PCP: Donnajean Lopes, MD  Assessment/Plan: 1. Bacteremia- patient has 2 out of 2 positive blood cultures growing staph aureus. Final result is pending. Currently she is on vancomycin per pharmacy consultation. Likely source from the infected graft. Vascular surgery has seen the patient and plan to take her for  repair of the graft in a.m. 2. Infected AV graft- versus) the following as above. 3. CAD- patient has severe three-vessel disease, stable. 4. ESRD- patient currently on hemodialysis Tuesday Thursday and Saturday. Will get hemodialysis today.  Code Status: Full code Family Communication: No family at bedside Disposition Plan: Home when medically stable   Consultants:  Nephrology  Vascular surgery  Procedures:  None  Antibiotics:  None   HPI/Subjective: 80 y.o. female with history of end-stage renal disease on dialysis Tuesday Thursdays and Saturdays. States that she recently had some work done on her left axis site roughly a month ago. Since then she has noticed recently within the last several days some discharge from her site. Blood cultures were obtained. And recently resulted positive both of them as such patient was referred to the ER for further evaluation recommendations. Currently patient's only complaint is chills. The problem has been persistent since onset and getting worse. Nothing she is aware of makes it better or worse  Patient complains of mild pain.  Objective: Filed Vitals:   04/11/15 0816  BP: 103/55  Pulse: 77  Temp: 97.6 F (36.4 C)  Resp: 17    Intake/Output Summary (Last 24 hours) at 04/11/15 1259 Last data filed at 04/11/15 1015  Gross per 24 hour  Intake    720 ml  Output    650 ml  Net     70 ml   Filed Weights   04/10/15 1808 04/11/15 0014  Weight: 52.702 kg (116 lb 3 oz) 52.118 kg (114 lb 14.4 oz)    Exam:   General:   Appear in no acute distress  Cardiovascular: S1S2 RRR  Respiratory: clear bilaterally  Abdomen: Soft, nontender, no organomegaly  Musculoskeletal: Left arm fistula, mild swelling and drainage   Data Reviewed: Basic Metabolic Panel:  Recent Labs Lab 04/09/15 2211 04/10/15 1824 04/11/15 0708  NA 141 141 145  K 3.5 4.6 4.2  CL 100* 106 113*  CO2 27 25 22   GLUCOSE 148* 161* 97  BUN 29* 40* 43*  CREATININE 4.23* 4.81* 5.03*  CALCIUM 9.3 9.3 9.4   Liver Function Tests:  Recent Labs Lab 04/09/15 2211 04/10/15 1824  AST 81* 77*  ALT 85* 84*  ALKPHOS 75 75  BILITOT 0.2* 0.3  PROT 6.3* 6.3*  ALBUMIN 3.4* 3.5   No results for input(s): LIPASE, AMYLASE in the last 168 hours. No results for input(s): AMMONIA in the last 168 hours. CBC:  Recent Labs Lab 04/09/15 2211 04/10/15 1824 04/11/15 0708  WBC 9.6 9.1 10.1  NEUTROABS 5.5 5.7  --   HGB 11.2* 10.7* 11.8*  HCT 33.6* 32.7* 36.1  MCV 93.1 92.1 93.3  PLT 158 149* 161    CBG:  Recent Labs Lab 04/11/15 0013  GLUCAP 126*    Recent Results (from the past 240 hour(s))  Blood Culture (routine x 2)     Status: None (Preliminary result)   Collection Time: 04/09/15 10:00 PM  Result Value Ref Range Status   Specimen Description BLOOD RIGHT ARM  Final   Special Requests BOTTLES DRAWN AEROBIC ONLY [place]  Final  Culture  Setup Time   Final    GRAM POSITIVE COCCI IN CLUSTERS AEROBIC BOTTLE ONLY CRITICAL RESULT CALLED TO, READ BACK BY AND VERIFIED WITH: L MILLER 04/10/15 @ 47 M VESTAL    Culture STAPHYLOCOCCUS AUREUS  Final   Report Status PENDING  Incomplete  Blood Culture (routine x 2)     Status: None (Preliminary result)   Collection Time: 04/09/15 10:22 PM  Result Value Ref Range Status   Specimen Description BLOOD RIGHT ARM  Final   Special Requests BOTTLES DRAWN AEROBIC AND ANAEROBIC 5CC EACH  Final   Culture  Setup Time   Final    GRAM POSITIVE COCCI IN CLUSTERS IN BOTH AEROBIC AND ANAEROBIC  BOTTLES CRITICAL RESULT CALLED TO, READ BACK BY AND VERIFIED WITH: L MILLER 04/10/15 @ 32 M VESTAL    Culture STAPHYLOCOCCUS AUREUS  Final   Report Status PENDING  Incomplete  MRSA PCR Screening     Status: None   Collection Time: 04/11/15  1:04 AM  Result Value Ref Range Status   MRSA by PCR NEGATIVE NEGATIVE Final    Comment:        The GeneXpert MRSA Assay (FDA approved for NASAL specimens only), is one component of a comprehensive MRSA colonization surveillance program. It is not intended to diagnose MRSA infection nor to guide or monitor treatment for MRSA infections.      Studies: Dg Chest 2 View  04/10/2015   CLINICAL DATA:  Positive blood cultures.  EXAM: CHEST  2 VIEW  COMPARISON:  12/13/2013  FINDINGS: Prior CABG. Heart is normal size. No confluent airspace opacities or effusions. No acute bony abnormality.  IMPRESSION: No active cardiopulmonary disease.   Electronically Signed   By: Rolm Baptise M.D.   On: 04/10/2015 21:52    Scheduled Meds: . [START ON 04/12/2015] aspirin  81 mg Oral Once per day on Sun Mon Wed Fri  . calcium acetate  667 mg Oral TID WC  . gabapentin  200 mg Oral QHS  . heparin  5,000 Units Subcutaneous 3 times per day  . hydrOXYzine  50 mg Oral QHS  . multivitamin  1 tablet Oral QHS  . sodium chloride  3 mL Intravenous Q12H  . vancomycin  500 mg Intravenous Q T,Th,Sa-HD   Continuous Infusions:   Principal Problem:   Bacteremia Active Problems:   CAD (coronary artery disease): Severe three vessel   ESRD (end stage renal disease) on dialysis    Time spent: 25 min    [facility] Pager [phone]. If 7PM-7AM, please contact night-coverage at [email or web address], password [facility] 04/11/2015, 12:59 PM  LOS: 1 day

## 2015-04-11 NOTE — Progress Notes (Signed)
Patient refused to sign her consent for graft revision in am. Patient wanted or comfortable with Dr Early doing the procedure. Patient very anxious at present with change of surgeons doing her surgery. Kept patient comfortable.

## 2015-04-11 NOTE — Progress Notes (Signed)
Utilization Review Completed.Diane [name] T6/21/2016  

## 2015-04-11 NOTE — Progress Notes (Signed)
Patient ID: DELARA SHEPHEARD, female   DOB: [birth year], 80 y.o.   MRN: [id number] Patient meds last night with a history of fevers and chills. Blood cultures were positive from her recent ER visit on 619. Physical exam she does have a excellent thrill in her left upper arm AV fistula. I had replaced a segment of eroded graft in the upper half of her left upper arm graft on 03/09/2015. Fortunately this area is completely healed. She does have a punctate area in the lower half of her fistula away from her old incisions. This is the area where she presented with bleeding on 04/09/2015. There is some mild erythema and some purulence at this site with the removal of her dressing this morning.  Impression and plan infection in her prosthetic Gore-Tex graft in the lower half of her graft. The remaining portion of the graft is well incorporated. I have recommended operative treatment tomorrow. She will dialyze today. Plan on removing this segment of graft. Hopefully can salvage the graft by wrapping around this area and removing this portion of her graft. She understands this may require total removal of the graft and she may have an open area for packing. Also understands that one of my partners will be doing the procedure.

## 2015-04-11 NOTE — Consult Note (Signed)
[facility]     Reason for Consult: Staph aureus bacteremia    Referring Physician: Dr. Marland Mcalpine consult  Principal Problem:   Bacteremia Active Problems:   CAD (coronary artery disease): Severe three vessel   ESRD (end stage renal disease) on dialysis   . [START ON 04/12/2015] aspirin  81 mg Oral Once per day on Sun Mon Wed Fri  . calcium acetate  667 mg Oral TID WC  . gabapentin  200 mg Oral QHS  . heparin  5,000 Units Subcutaneous 3 times per day  . hydrOXYzine  50 mg Oral QHS  . multivitamin  1 tablet Oral QHS  . sodium chloride  3 mL Intravenous Q12H  . vancomycin  500 mg Intravenous Q T,Th,Sa-HD    Recommendations: Will need either vancomycin or cefazolin for 6 weeks with infected graft (amoxicillin intolerance but not allergy)  repeat blood cultures to assure clearance  Assessment: She has infected graft and Staph aureus bacteremia   Antibiotics: vancomycin  HPI: Diane [name] is a 80 y.o. female with ESRD on dialysis with recent placement of AFG and noted discharge from area and warmth with pain.  Blood cultures sent and now positive and came back for admission.  Now feels better.  Has been on vancomycin and to get surgery in am, trying to salvage part of it.    Review of Systems: A comprehensive review of systems was negative.  Past Medical History  Diagnosis Date  . Anemia in chronic kidney disease(285.21)   . Bipolar disorder, unspecified   . Iron deficiency anemia, unspecified   . Secondary hyperparathyroidism (of renal origin)   . Restless legs syndrome (RLS)   . Unspecified hypertensive kidney disease with chronic kidney disease stage V or end stage renal disease   . 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 Personal history of colonic polyps   . Diverticulosis of colon (without mention of hemorrhage) 2006    Colonoscopy Dr. Oletta Lamas  . Coronary artery  disease   . Anginal pain   . S/P CABG x 5, 08/15/12, LIMA-LAD;VG-diag;VG-OM; seq VG-PDA,PLA  08/18/2012  . Anxiety   . Depression     History  Substance Use Topics  . Smoking status: Never Smoker   . Smokeless tobacco: Never Used  . Alcohol Use: No    History reviewed. No pertinent family history. Allergies  Allergen Reactions  . Ambien [Zolpidem Tartrate]     Stays awake  . Amoxicillin Diarrhea  . Clavulanic Acid   . Sulfa Antibiotics Other (See Comments)    Migraine   . Latex Rash  . Tape Rash    Can only use paper tape     OBJECTIVE: Blood pressure 103/55, pulse 77, temperature 97.6 F (36.4 C), temperature source Oral, resp. rate 17, height 5\' 1"  (1.549 m), weight 114 lb 14.4 oz (52.118 kg), SpO2 100 %. General: awake, alert, nad Skin: no rashes Lungs: CTA B Cor: RRR Abdomen: soft, nt Ext: left arm with graft site, no warmth  Microbiology: Recent Results (from the past 240 hour(s))  Blood Culture (routine x 2)     Status: None (Preliminary result)   Collection Time: 04/09/15 10:00 PM  Result Value Ref Range Status   Specimen Description BLOOD RIGHT ARM  Final   Special Requests BOTTLES DRAWN AEROBIC ONLY [place]  Final   Culture  Setup Time   Final  GRAM POSITIVE COCCI IN CLUSTERS AEROBIC BOTTLE ONLY CRITICAL RESULT CALLED TO, READ BACK BY AND VERIFIED WITH: L MILLER 04/10/15 @ 76 M VESTAL    Culture STAPHYLOCOCCUS AUREUS  Final   Report Status PENDING  Incomplete  Blood Culture (routine x 2)     Status: None (Preliminary result)   Collection Time: 04/09/15 10:22 PM  Result Value Ref Range Status   Specimen Description BLOOD RIGHT ARM  Final   Special Requests BOTTLES DRAWN AEROBIC AND ANAEROBIC 5CC EACH  Final   Culture  Setup Time   Final    GRAM POSITIVE COCCI IN CLUSTERS IN BOTH AEROBIC AND ANAEROBIC BOTTLES CRITICAL RESULT CALLED TO, READ BACK BY AND VERIFIED WITH: L MILLER 04/10/15 @ 47 M VESTAL    Culture STAPHYLOCOCCUS AUREUS  Final   Report  Status PENDING  Incomplete  MRSA PCR Screening     Status: None   Collection Time: 04/11/15  1:04 AM  Result Value Ref Range Status   MRSA by PCR NEGATIVE NEGATIVE Final    Comment:        The GeneXpert MRSA Assay (FDA approved for NASAL specimens only), is one component of a comprehensive MRSA colonization surveillance program. It is not intended to diagnose MRSA infection nor to guide or monitor treatment for MRSA infections.     Scharlene Gloss, [facility] [place] [email or web address] [phone] pager  [phone] cell 04/11/2015, 2:13 PM

## 2015-04-11 NOTE — Progress Notes (Signed)
  Echocardiogram 2D Echocardiogram has been performed.  Bobbye Charleston 04/11/2015, 11:34 AM

## 2015-04-12 ENCOUNTER — Encounter (HOSPITAL_COMMUNITY)
Admission: EM | Disposition: A | Discharge status: Skilled Nursing Facility | Payer: Self-pay | Source: Home / Self Care | Attending: Internal Medicine

## 2015-04-12 ENCOUNTER — Inpatient Hospital Stay (HOSPITAL_COMMUNITY): Payer: Medicare Other | Admitting: Anesthesiology

## 2015-04-12 DIAGNOSIS — I770 Arteriovenous fistula, acquired: Secondary | ICD-10-CM

## 2015-04-12 DIAGNOSIS — T82898A Other specified complication of vascular prosthetic devices, implants and grafts, initial encounter: Secondary | ICD-10-CM

## 2015-04-12 HISTORY — PX: REVISION OF ARTERIOVENOUS GORETEX GRAFT: SHX6073

## 2015-04-12 LAB — PROTIME-INR
INR: 1.05 (ref 0.00–1.49)
PROTHROMBIN TIME: 13.9 s (ref 11.6–15.2)

## 2015-04-12 LAB — BASIC METABOLIC PANEL
ANION GAP: 13 (ref 5–15)
BUN: 23 mg/dL — ABNORMAL HIGH (ref 6–20)
CO2: 27 mmol/L (ref 22–32)
CREATININE: 3.67 mg/dL — AB (ref 0.44–1.00)
Calcium: 8.7 mg/dL — ABNORMAL LOW (ref 8.9–10.3)
Chloride: 100 mmol/L — ABNORMAL LOW (ref 101–111)
GFR calc non Af Amer: 11 mL/min — ABNORMAL LOW (ref >60)
GFR, EST AFRICAN AMERICAN: 13 mL/min — AB (ref >60)
Glucose, Bld: 96 mg/dL (ref 65–99)
POTASSIUM: 3.8 mmol/L (ref 3.5–5.1)
SODIUM: 140 mmol/L (ref 135–145)

## 2015-04-12 LAB — CULTURE, BLOOD (ROUTINE X 2)

## 2015-04-12 LAB — CBC
HEMATOCRIT: 31.9 % — AB (ref 36.0–46.0)
Hemoglobin: 10.5 g/dL — ABNORMAL LOW (ref 12.0–15.0)
MCH: 30.1 pg (ref 26.0–34.0)
MCHC: 32.9 g/dL (ref 30.0–36.0)
MCV: 91.4 fL (ref 78.0–100.0)
Platelets: 137 10*3/uL — ABNORMAL LOW (ref 150–400)
RBC: 3.49 MIL/uL — ABNORMAL LOW (ref 3.87–5.11)
RDW: 13.6 % (ref 11.5–15.5)
WBC: 8.3 10*3/uL (ref 4.0–10.5)

## 2015-04-12 SURGERY — REVISION OF ARTERIOVENOUS GORETEX GRAFT
Anesthesia: General | Site: Arm Upper | Laterality: Left

## 2015-04-12 MED ORDER — FENTANYL CITRATE (PF) 100 MCG/2ML IJ SOLN
INTRAMUSCULAR | Status: DC | PRN
  Administered 2015-04-12 (×2): 25 ug via INTRAVENOUS

## 2015-04-12 MED ORDER — PHENYLEPHRINE 40 MCG/ML (10ML) SYRINGE FOR IV PUSH (FOR BLOOD PRESSURE SUPPORT)
PREFILLED_SYRINGE | INTRAVENOUS | Status: AC
  Filled 2015-04-12: qty 10

## 2015-04-12 MED ORDER — ONDANSETRON HCL 4 MG/2ML IJ SOLN
INTRAMUSCULAR | Status: DC | PRN
  Administered 2015-04-12: 4 mg via INTRAVENOUS

## 2015-04-12 MED ORDER — VANCOMYCIN HCL IN DEXTROSE 1-5 GM/200ML-% IV SOLN
INTRAVENOUS | Status: AC
  Filled 2015-04-12: qty 200

## 2015-04-12 MED ORDER — ONDANSETRON HCL 4 MG/2ML IJ SOLN
INTRAMUSCULAR | Status: AC
  Filled 2015-04-12: qty 2

## 2015-04-12 MED ORDER — SODIUM CHLORIDE 0.9 % IV SOLN
INTRAVENOUS | Status: DC
  Administered 2015-04-12: 10:00:00 via INTRAVENOUS

## 2015-04-12 MED ORDER — FENTANYL CITRATE (PF) 250 MCG/5ML IJ SOLN
INTRAMUSCULAR | Status: AC
  Filled 2015-04-12: qty 5

## 2015-04-12 MED ORDER — VANCOMYCIN HCL 1000 MG IV SOLR
1000.0000 mg | INTRAVENOUS | Status: DC | PRN
  Administered 2015-04-12: 1000 mg via INTRAVENOUS

## 2015-04-12 MED ORDER — PHENYLEPHRINE HCL 10 MG/ML IJ SOLN
INTRAMUSCULAR | Status: DC | PRN
  Administered 2015-04-12 (×2): 40 ug via INTRAVENOUS

## 2015-04-12 MED ORDER — CEFAZOLIN SODIUM-DEXTROSE 2-3 GM-% IV SOLR
2.0000 g | INTRAVENOUS | Status: DC
  Administered 2015-04-13 – 2015-04-15 (×2): 2 g via INTRAVENOUS
  Filled 2015-04-12 (×5): qty 50

## 2015-04-12 MED ORDER — MEPERIDINE HCL 25 MG/ML IJ SOLN: 6.2500 mg | INTRAMUSCULAR | Status: DC | PRN

## 2015-04-12 MED ORDER — SODIUM CHLORIDE 0.9 % IR SOLN
Status: DC | PRN
  Administered 2015-04-12: 500 mL

## 2015-04-12 MED ORDER — FENTANYL CITRATE (PF) 100 MCG/2ML IJ SOLN
25.0000 ug | INTRAMUSCULAR | Status: DC | PRN
  Administered 2015-04-12 (×3): 25 ug via INTRAVENOUS

## 2015-04-12 MED ORDER — CEFAZOLIN SODIUM-DEXTROSE 2-3 GM-% IV SOLR
2.0000 g | Freq: Once | INTRAVENOUS | Status: AC
Start: 2015-04-12 — End: 2015-04-12
  Administered 2015-04-12: 2 g via INTRAVENOUS
  Filled 2015-04-12: qty 50

## 2015-04-12 MED ORDER — DEXTROSE 5 % IV SOLN
10.0000 mg | INTRAVENOUS | Status: DC | PRN
  Administered 2015-04-12: 10 ug/min via INTRAVENOUS

## 2015-04-12 MED ORDER — MIDAZOLAM HCL 2 MG/2ML IJ SOLN: 0.5000 mg | Freq: Once | INTRAMUSCULAR | Status: DC | PRN

## 2015-04-12 MED ORDER — FENTANYL CITRATE (PF) 100 MCG/2ML IJ SOLN
INTRAMUSCULAR | Status: AC
  Filled 2015-04-12: qty 2

## 2015-04-12 MED ORDER — PROPOFOL 10 MG/ML IV BOLUS
INTRAVENOUS | Status: AC
  Filled 2015-04-12: qty 20

## 2015-04-12 MED ORDER — 0.9 % SODIUM CHLORIDE (POUR BTL) OPTIME
TOPICAL | Status: DC | PRN
  Administered 2015-04-12: 1000 mL

## 2015-04-12 MED ORDER — LIDOCAINE HCL (PF) 1 % IJ SOLN
INTRAMUSCULAR | Status: AC
Start: 2015-04-12 — End: 2015-04-12
  Filled 2015-04-12: qty 30

## 2015-04-12 MED ORDER — PROMETHAZINE HCL 25 MG/ML IJ SOLN: 6.2500 mg | INTRAMUSCULAR | Status: DC | PRN

## 2015-04-12 SURGICAL SUPPLY — 32 items
CANISTER SUCTION 2500CC (MISCELLANEOUS) ×3 IMPLANT
CLIP TI MEDIUM 6 (CLIP) ×3 IMPLANT
CLIP TI WIDE RED SMALL 6 (CLIP) ×3 IMPLANT
DECANTER SPIKE VIAL GLASS SM (MISCELLANEOUS) ×1 IMPLANT
DRSG TEGADERM 2-3/8X2-3/4 SM (GAUZE/BANDAGES/DRESSINGS) ×2 IMPLANT
ELECT REM PT RETURN 9FT ADLT (ELECTROSURGICAL) ×3
ELECTRODE REM PT RTRN 9FT ADLT (ELECTROSURGICAL) ×1 IMPLANT
GAUZE SPONGE 2X2 8PLY STRL LF (GAUZE/BANDAGES/DRESSINGS) IMPLANT
GEL ULTRASOUND 20GR AQUASONIC (MISCELLANEOUS) IMPLANT
GLOVE BIOGEL PI IND STRL 6.5 (GLOVE) IMPLANT
GLOVE BIOGEL PI INDICATOR 6.5 (GLOVE) ×6
GLOVE SS BIOGEL STRL SZ 7 (GLOVE) ×1 IMPLANT
GLOVE SUPERSENSE BIOGEL SZ 7 (GLOVE) ×2
GLOVE SURG SS PI 6.5 STRL IVOR (GLOVE) ×6 IMPLANT
GOWN STRL REUS W/ TWL LRG LVL3 (GOWN DISPOSABLE) ×3 IMPLANT
GOWN STRL REUS W/TWL LRG LVL3 (GOWN DISPOSABLE) ×9
GRAFT GORETEX 6X10 (Vascular Products) ×2 IMPLANT
KIT BASIN OR (CUSTOM PROCEDURE TRAY) ×3 IMPLANT
KIT ROOM TURNOVER OR (KITS) ×3 IMPLANT
LIQUID BAND (GAUZE/BANDAGES/DRESSINGS) ×3 IMPLANT
NDL HYPO 25GX1X1/2 BEV (NEEDLE) ×1 IMPLANT
NEEDLE HYPO 25GX1X1/2 BEV (NEEDLE) ×3 IMPLANT
NS IRRIG 1000ML POUR BTL (IV SOLUTION) ×3 IMPLANT
PACK CV ACCESS (CUSTOM PROCEDURE TRAY) ×3 IMPLANT
PAD ARMBOARD 7.5X6 YLW CONV (MISCELLANEOUS) ×6 IMPLANT
SPONGE GAUZE 2X2 STER 10/PKG (GAUZE/BANDAGES/DRESSINGS) ×2
SUT ETHILON 4 0 PS 2 18 (SUTURE) ×2 IMPLANT
SUT PROLENE 6 0 BV (SUTURE) ×6 IMPLANT
SUT VIC AB 3-0 SH 27 (SUTURE) ×6
SUT VIC AB 3-0 SH 27X BRD (SUTURE) ×2 IMPLANT
UNDERPAD 30X30 INCONTINENT (UNDERPADS AND DIAPERS) ×3 IMPLANT
WATER STERILE IRR 1000ML POUR (IV SOLUTION) ×3 IMPLANT

## 2015-04-12 NOTE — H&P (View-Only) (Signed)
Patient ID: Diane [name], female   DOB: 03-06-1935, 80 y.o.   MRN: [id number] Patient meds last night with a history of fevers and chills. Blood cultures were positive from her recent ER visit on 619. Physical exam she does have a excellent thrill in her left upper arm AV fistula. I had replaced a segment of eroded graft in the upper half of her left upper arm graft on 03/09/2015. Fortunately this area is completely healed. She does have a punctate area in the lower half of her fistula away from her old incisions. This is the area where she presented with bleeding on 04/09/2015. There is some mild erythema and some purulence at this site with the removal of her dressing this morning.  Impression and plan infection in her prosthetic Gore-Tex graft in the lower half of her graft. The remaining portion of the graft is well incorporated. I have recommended operative treatment tomorrow. She will dialyze today. Plan on removing this segment of graft. Hopefully can salvage the graft by wrapping around this area and removing this portion of her graft. She understands this may require total removal of the graft and she may have an open area for packing. Also understands that one of my partners will be doing the procedure.

## 2015-04-12 NOTE — Consult Note (Signed)
ANTIBIOTIC CONSULT NOTE - INITIAL  Pharmacy Consult for Ancef Indication: bacteremia  Allergies  Allergen Reactions  . Ambien [Zolpidem Tartrate]     Stays awake  . Amoxicillin Diarrhea  . Clavulanic Acid   . Sulfa Antibiotics Other (See Comments)    Migraine   . Latex Rash  . Tape Rash    Can only use paper tape     Patient Measurements: Height: 5\' 1"  (154.9 cm) Weight: 120 lb 14.4 oz (54.84 kg) IBW/kg (Calculated) : 47.8  Vital Signs: Temp: 98 F (36.7 C) (06/22 1311) Temp Source: Oral (06/22 1311) BP: 102/62 mmHg (06/22 1311) Pulse Rate: 80 (06/22 1311) Intake/Output from previous day: 06/21 0701 - 06/22 0700 In: 940 [P.O.:840; IV Piggyback:100] Out: 2275 [Urine:1675] Intake/Output from this shift: Total I/O In: 200 [I.V.:200] Out: 585 [Urine:550; Blood:35]  Labs:  Recent Labs  04/10/15 1824 04/11/15 0708 04/12/15 0545  WBC 9.1 10.1 8.3  HGB 10.7* 11.8* 10.5*  PLT 149* 161 137*  CREATININE 4.81* 5.03* 3.67*   Estimated Creatinine Clearance: 9.5 mL/min (by C-G formula based on Cr of 3.67).   Microbiology: Recent Results (from the past 720 hour(s))  Blood Culture (routine x 2)     Status: None   Collection Time: 04/09/15 10:00 PM  Result Value Ref Range Status   Specimen Description BLOOD RIGHT ARM  Final   Special Requests BOTTLES DRAWN AEROBIC ONLY [place]  Final   Culture  Setup Time   Final    GRAM POSITIVE COCCI IN CLUSTERS AEROBIC BOTTLE ONLY CRITICAL RESULT CALLED TO, READ BACK BY AND VERIFIED WITH: L MILLER 04/10/15 @ [place]  Final   Report Status 04/12/2015 FINAL  Final   Organism ID, Bacteria STAPHYLOCOCCUS AUREUS  Final      Susceptibility   Staphylococcus aureus - MIC*    CIPROFLOXACIN <=0.5 SENSITIVE Sensitive     ERYTHROMYCIN >=8 RESISTANT Resistant     GENTAMICIN <=0.5 SENSITIVE Sensitive     OXACILLIN 0.5 SENSITIVE Sensitive     TETRACYCLINE <=1 SENSITIVE Sensitive     VANCOMYCIN 1 SENSITIVE  Sensitive     TRIMETH/SULFA <=10 SENSITIVE Sensitive     CLINDAMYCIN <=0.25 SENSITIVE Sensitive     RIFAMPIN <=0.5 SENSITIVE Sensitive     Inducible Clindamycin NEGATIVE Sensitive     * STAPHYLOCOCCUS AUREUS  Blood Culture (routine x 2)     Status: None   Collection Time: 04/09/15 10:22 PM  Result Value Ref Range Status   Specimen Description BLOOD RIGHT ARM  Final   Special Requests BOTTLES DRAWN AEROBIC AND ANAEROBIC 5CC EACH  Final   Culture  Setup Time   Final    GRAM POSITIVE COCCI IN CLUSTERS IN BOTH AEROBIC AND ANAEROBIC BOTTLES CRITICAL RESULT CALLED TO, READ BACK BY AND VERIFIED WITH: L MILLER 04/10/15 @ 31 M VESTAL    Culture   Final    STAPHYLOCOCCUS AUREUS SUSCEPTIBILITIES PERFORMED ON PREVIOUS CULTURE WITHIN THE LAST 5 DAYS.    Report Status 04/12/2015 FINAL  Final  MRSA PCR Screening     Status: None   Collection Time: 04/11/15  1:04 AM  Result Value Ref Range Status   MRSA by PCR NEGATIVE NEGATIVE Final    Comment:        The GeneXpert MRSA Assay (FDA approved for NASAL specimens only), is one component of a comprehensive MRSA colonization surveillance program. It is not intended to diagnose MRSA infection nor to guide or monitor treatment  for MRSA infections.     Medical History: Past Medical History  Diagnosis Date  . Anemia in chronic kidney disease(285.21)   . Bipolar disorder, unspecified   . Iron deficiency anemia, unspecified   . Secondary hyperparathyroidism (of renal origin)   . Restless legs syndrome (RLS)   . Unspecified hypertensive kidney disease with chronic kidney disease stage V or end stage renal disease   . 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 Personal history of colonic polyps   . Diverticulosis of colon (without mention of hemorrhage) 2006    Colonoscopy Dr. Oletta Lamas  . Coronary artery disease   . Anginal pain   . 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 Anxiety   . Depression    Assessment: 80yof with ESRD on HD TTS. Patient was seen in the [facility] 6/19 for an AV graft infection. Blood cultures were drawn and she was sent home on oral doxycycline. Blood cultures grew MSSA.  MSSA bacteremia with AVF infection with revision today. ID consulted and ordered to continue with cefazolin with dialysis for 6 weeks through August 2nd.  ID repeated blood cultures today to assure clearance, discontinued IV vancomycin and ordered pharmacy to dose IV Ancef for MSSA bacteremia.     Plan:  Ancef 2gm IV today x1 then 2gm IV qHD  (?qTTS) Follow HD schedule/tolerance, cultures  Thank you for allowing pharmacy to be part of this patients care team. Nicole Cella, RPh Clinical Pharmacist Pager: [phone] 04/12/2015,2:59 PM

## 2015-04-12 NOTE — Transfer of Care (Signed)
Immediate Anesthesia Transfer of Care Note  Patient: Diane [name]  Procedure(s) Performed: Procedure(s): REVISION WITH INSERTION OF NEW 6 MM GORTEX GRAFT ,REMOVAL INFECTED SEGMENT  AV GORTEX GRAFT LEFT UPPER ARM (Left)  Patient Location: PACU  Anesthesia Type:General  Level of Consciousness: awake, alert , oriented, patient cooperative and responds to stimulation  Airway & Oxygen Therapy: Patient Spontanous Breathing and Patient connected to nasal cannula oxygen  Post-op Assessment: Report given to RN, Post -op Vital signs reviewed and stable, Patient moving all extremities and Patient moving all extremities X 4  Post vital signs: stable  Last Vitals:  Filed Vitals:   04/12/15 0803  BP: 106/58  Pulse: 94  Temp: 36.8 C  Resp: 17    Complications: No apparent anesthesia complications

## 2015-04-12 NOTE — Interval H&P Note (Signed)
History and Physical Interval Note:  04/12/2015 8:17 AM  Diane [name]  has presented today for surgery, with the diagnosis of End Stage Renal Disease N18.6  The various methods of treatment have been discussed with the patient and family. After consideration of risks, benefits and other options for treatment, the patient has consented to  Procedure(s): REVISION OF ARTERIOVENOUS GORETEX GRAFT (Left) as a surgical intervention .  The patient's history has been reviewed, patient examined, no change in status, stable for surgery.  I have reviewed the patient's chart and labs.  Questions were answered to the patient's satisfaction.     [name]

## 2015-04-12 NOTE — Progress Notes (Signed)
Pt very emotional, crying. Stating doesn't know name, where she is or why she is here. Asking for daughter. States arm hurts. Not using 0-10 at this time. Medication given

## 2015-04-12 NOTE — Progress Notes (Signed)
Subjective: This am pt  Very anxious about Surgery ,had refused consent sign= Dr. Donnetta Hutching in to dw pt./ Hd yest with upper portion of Avg But signed off early sec to chills on HD and  some Anxiety.( Ho bipolar/anxiety) . I dw with her Daughter on phone  Need for consent and pt  Now talking with daughter. Seen after surgery no complaints  Objective Vital signs in last 24 hours: Filed Vitals:   04/11/15 1900 04/11/15 2042 04/12/15 0505 04/12/15 0803  BP: 110/65 101/49 93/48 106/58  Pulse: 94 96 87 94  Temp: 98.4 F (36.9 C) 98.9 F (37.2 C) 98.6 F (37 C) 98.3 F (36.8 C)  TempSrc: Oral Oral Oral Oral  Resp: 17 16 14 17   Height:      Weight:  54.84 kg (120 lb 14.4 oz)    SpO2: 95% 97% 97% 99%   Weight change: -0.202 kg (-7.1 oz)  Physical Exam: General: Very anxious this am more than baseline/but  Not in distress Heart: RRR, no mur, gallop or rub Lungs: CTA bilat, non labored breathing Abdomen: BS pos. Soft, NT, ND Extremities: no pedal edema Dialysis Access: L UA AVGG pos bruit / Lower portion not unwrapped    OP Dialysis Orders: Center: [facility] on TTS. EDW 53.5 HD Bath 2k/ 2.25 ca Time 3.5 hr Heparin 3000. Access LUA AVGG  Last Mircer[facility] 03/07/15 Units IV/HD Venofer 0  Other op labs = 04/06/15 hgb 11.4 Ca 9.4 phos 5.2 pth 149  Problem/Plan  1. Staph aureus bacteremia [facility] 04/09/15 [name]) = with L lower portion Avgg  Infected/ Pt did sign Consent this am for Dr. Tessah Patchen Simmering revision of avgg/ ID seeing / on Vancomycin   2. ESRD - TTS HD [facility] = HD yest  used upper portion avg And no hep- /k 3.8 3. Hypertension/volume - bp 106/58 no bp meds as op / no change in edw so far no vol issues 4. Anemia - hgb 10.5 no esa or fe op lab/ Fu cbcs- she tells me she is getting phlebotomy ??  For high iron  5. Metabolic bone disease - 8.7 Ca / phoslo binder No vit d  6. Nutrition - alb 3.5 Renal diet and renal vit 7. HO Bipolar/Anxiety- after long  discussion with pt and dw her daughter by phone pt agrees to [place] surgery this am/ take Tranxene 3.75mg    Bid prn    Ernest Haber, PA-C [place] [phone] 04/12/2015,8:05 AM  LOS: 2 days    Patient seen and examined, agree with above note with above modifications. Now s/p resection of infected part of AVG- still enough left so we can use upper portion with HD tomorrow [name], MD 04/12/2015     Labs: Basic Metabolic Panel:  Recent Labs Lab 04/10/15 1824 04/11/15 0708 04/12/15 0545  NA 141 145 140  K 4.6 4.2 3.8  CL 106 113* 100*  CO2 25 22 27   GLUCOSE 161* 97 96  BUN 40* 43* 23*  CREATININE 4.81* 5.03* 3.67*  CALCIUM 9.3 9.4 8.7*   Liver Function Tests:  Recent Labs Lab 04/09/15 2211 04/10/15 1824  AST 81* 77*  ALT 85* 84*  ALKPHOS 75 75  BILITOT 0.2* 0.3  PROT 6.3* 6.3*  ALBUMIN 3.4* 3.5   CBC:  Recent Labs Lab 04/09/15 2211 04/10/15 1824 04/11/15 0708 04/12/15 0545  WBC 9.6 9.1 10.1 8.3  NEUTROABS 5.5 5.7  --   --   HGB 11.2* 10.7* 11.8* 10.5*  HCT 33.6* 32.7* 36.1 31.9*  MCV 93.1 92.1 93.3 91.4  PLT 158 149* 161 137*   Cardiac Enzymes: No results for input(s): CKTOTAL, CKMB, CKMBINDEX, TROPONINI in the last 168 hours. CBG:  Recent Labs Lab 04/11/15 0013  GLUCAP 126*    Studies/Results: Dg Chest 2 View  04/10/2015   CLINICAL DATA:  Positive blood cultures.  EXAM: CHEST  2 VIEW  COMPARISON:  12/13/2013  FINDINGS: Prior CABG. Heart is normal size. No confluent airspace opacities or effusions. No acute bony abnormality.  IMPRESSION: No active cardiopulmonary disease.   Electronically Signed   By: Rolm Baptise M.D.   On: 04/10/2015 21:52   Medications:   . aspirin  81 mg Oral Once per day on Sun Mon Wed Fri  . calcium acetate  667 mg Oral TID WC  . gabapentin  200 mg Oral QHS  . heparin  5,000 Units Subcutaneous 3 times per day  . hydrOXYzine  50 mg Oral QHS  . multivitamin  1 tablet Oral QHS  . sodium  chloride  3 mL Intravenous Q12H  . vancomycin  500 mg Intravenous Q T,Th,Sa-HD

## 2015-04-12 NOTE — Progress Notes (Signed)
[facility] for [facility]  Date of Admission:  04/10/2015  Antibiotics: vancomycin  Subjective: Some pain in arm  Objective: Temp:  [97.3 F (36.3 C)-98.9 F (37.2 C)] 98 F (36.7 C) (06/22 1311) Pulse Rate:  [66-96] 80 (06/22 1311) Resp:  [13-25] 17 (06/22 1311) BP: (93-116)/(44-65) 102/62 mmHg (06/22 1311) SpO2:  [95 %-100 %] 100 % (06/22 1311) Weight:  [114 lb 6.7 oz (51.9 kg)-120 lb 14.4 oz (54.84 kg)] 120 lb 14.4 oz (54.84 kg) (06/21 2042)  General: awake, alert, nad Skin: no rashes Lungs: CTA B Cor: RRR   Lab Results Lab Results  Component Value Date   WBC 8.3 04/12/2015   HGB 10.5* 04/12/2015   HCT 31.9* 04/12/2015   MCV 91.4 04/12/2015   PLT 137* 04/12/2015    Lab Results  Component Value Date   CREATININE 3.67* 04/12/2015   BUN 23* 04/12/2015   NA 140 04/12/2015   K 3.8 04/12/2015   CL 100* 04/12/2015   CO2 27 04/12/2015    Lab Results  Component Value Date   ALT 84* 04/10/2015   AST 77* 04/10/2015   ALKPHOS 75 04/10/2015   BILITOT 0.3 04/10/2015      Microbiology: Recent Results (from the past 240 hour(s))  Blood Culture (routine x 2)     Status: None   Collection Time: 04/09/15 10:00 PM  Result Value Ref Range Status   Specimen Description BLOOD RIGHT ARM  Final   Special Requests BOTTLES DRAWN AEROBIC ONLY [place]  Final   Culture  Setup Time   Final    GRAM POSITIVE COCCI IN CLUSTERS AEROBIC BOTTLE ONLY CRITICAL RESULT CALLED TO, READ BACK BY AND VERIFIED WITH: L MILLER 04/10/15 @ [place]  Final   Report Status 04/12/2015 FINAL  Final   Organism ID, Bacteria STAPHYLOCOCCUS AUREUS  Final      Susceptibility   Staphylococcus aureus - MIC*    CIPROFLOXACIN <=0.5 SENSITIVE Sensitive     ERYTHROMYCIN >=8 RESISTANT Resistant     GENTAMICIN <=0.5 SENSITIVE Sensitive     OXACILLIN 0.5 SENSITIVE Sensitive     TETRACYCLINE <=1 SENSITIVE Sensitive     VANCOMYCIN 1 SENSITIVE Sensitive    TRIMETH/SULFA <=10 SENSITIVE Sensitive     CLINDAMYCIN <=0.25 SENSITIVE Sensitive     RIFAMPIN <=0.5 SENSITIVE Sensitive     Inducible Clindamycin NEGATIVE Sensitive     * STAPHYLOCOCCUS AUREUS  Blood Culture (routine x 2)     Status: None   Collection Time: 04/09/15 10:22 PM  Result Value Ref Range Status   Specimen Description BLOOD RIGHT ARM  Final   Special Requests BOTTLES DRAWN AEROBIC AND ANAEROBIC 5CC EACH  Final   Culture  Setup Time   Final    GRAM POSITIVE COCCI IN CLUSTERS IN BOTH AEROBIC AND ANAEROBIC BOTTLES CRITICAL RESULT CALLED TO, READ BACK BY AND VERIFIED WITH: L MILLER 04/10/15 @ 60 M VESTAL    Culture   Final    STAPHYLOCOCCUS AUREUS SUSCEPTIBILITIES PERFORMED ON PREVIOUS CULTURE WITHIN THE LAST 5 DAYS.    Report Status 04/12/2015 FINAL  Final  MRSA PCR Screening     Status: None   Collection Time: 04/11/15  1:04 AM  Result Value Ref Range Status   MRSA by PCR NEGATIVE NEGATIVE Final    Comment:        The GeneXpert MRSA Assay (FDA approved for NASAL specimens only), is one component of a comprehensive MRSA colonization  surveillance program. It is not intended to diagnose MRSA infection nor to guide or monitor treatment for MRSA infections.     Studies/Results: Dg Chest 2 View  04/10/2015   CLINICAL DATA:  Positive blood cultures.  EXAM: CHEST  2 VIEW  COMPARISON:  12/13/2013  FINDINGS: Prior CABG. Heart is normal size. No confluent airspace opacities or effusions. No acute bony abnormality.  IMPRESSION: No active cardiopulmonary disease.   Electronically Signed   By: Rolm Baptise M.D.   On: 04/10/2015 21:52    Assessment/Plan:  1) MSSA bacteremia with AVF infection with revision today - would continue with cefazolin with dialysis for 6 weeks through August 2nd. Will repeat blood cultures to assure clearance.  I will sign off  COMER, Herbie [place], [place] for Infectious Disease [place] [email or web address] [phone]  pager   [phone] cell 04/12/2015, 2:05 PM

## 2015-04-12 NOTE — Anesthesia Procedure Notes (Signed)
Procedure Name: LMA Insertion Date/Time: 04/12/2015 10:02 AM Performed by: Vennie Homans Pre-anesthesia Checklist: Patient identified, Emergency Drugs available, Suction available, Patient being monitored and Timeout performed Patient Re-evaluated:Patient Re-evaluated prior to inductionOxygen Delivery Method: Circle system utilized Preoxygenation: Pre-oxygenation with 100% oxygen Intubation Type: IV induction Ventilation: Mask ventilation without difficulty LMA: LMA inserted LMA Size: 4.0 Number of attempts: 1 Placement Confirmation: positive ETCO2 and breath sounds checked- equal and bilateral Tube secured with: Tape Dental Injury: Teeth and Oropharynx as per pre-operative assessment

## 2015-04-12 NOTE — Op Note (Signed)
OPERATIVE REPORT  Date of Surgery: 04/10/2015 - 04/12/2015  Surgeon: [name], MD  Assistant: Leontine Locket PA  Pre-op Diagnosis:  infected segment left upper arm AV gortex graft  Post-op Diagnosis: infected segment left upper arm AV gortex graft  Procedure: Procedure(s): REVISION WITH INSERTION OF NEW SEGMENT of 6 MM GORTEX GRAFT ,REMOVAL INFECTED SEGMENT  AV GORTEX GRAFT LEFT UPPER ARM  Anesthesia: LMA  EBL: Minimal  Complications: None  Procedure Details: The patient was taken to the operating room placed in supine position at which time satisfactory general-LMA anesthesia was administered. The left upper extremity was prepped with Betadine scrub and solution draped in routine sterile manner. There was a functioning left upper arm AV graft which had one small segment in the midportion at about the 3:00 position which had had some localized infection with purulent drainage previously. This was carefully isolated away using an adhesive drape. Following this a short longitudinal incision was made near the arterial anastomosis to the brachial artery and the graft dissected free and was well incorporated and free of infection. Second incision was made about 4 cm proximal to the area in question where a previous recent revision had been performed. The graft was very well incorporated at this point as well and dissected free. A new tunnel was created peripheral to this area laterally and a 6 mm Gore-Tex graft liver through the tunnel. No heparin was given. Graft was occluded proximally and distally transected in both areas and was free of infection in these areas. The new graft was anastomosed end and to the old graft with 60 proline in both areas now effectively rerouting the graft around the affected segment. After adequate hemostasis was achieved and there was excellent pulse and thrill in the graft wounds were both closed in layers with 50 subcutaneous Tickler fashion with Dermabond. This  was then also isolated away from the infected segment with an adhesive drape and a short incision was made through the area where the permanent drainage had been located. There was no purulent drainage at this point but there was an eroded area of skin extending down to the graft. The graft did not appear infected. The graft itself was excised in this 2-3 cm area and the scope can which was of poor quality where the opening had been located was excised. Following thorough irrigation of the wound was then closed in layers with interrupted 30 Vicryls and a few vertical mattress sutures of 4-0 nylon. Sterile dressing was then applied patient taken to recovery room satisfactory condition   [name], MD 04/12/2015 11:26 AM

## 2015-04-12 NOTE — Progress Notes (Signed)
Pt's daughter, Butch Penny, was contacted and able to speak with Ernest Haber, PA about concerns. Pt agreed to sign consent after speaking with daughter. Daughter declined speaking to this RN. Pt now being transported to OR.

## 2015-04-12 NOTE — Progress Notes (Signed)
[facility] PROGRESS NOTE  Diane [name] JTT:[id number] DOB: 12/25/1934 DOA: 04/10/2015 PCP: Donnajean Lopes, MD  Assessment/Plan:  Bacteremia- patient has 2 out of 2 positive blood cultures growing MSSA. ID consulted and recommended abx changed to cefazolin during HD till 8/2 for total of 6 weeks treatment. vancomycin d/ced. Likely source from the infected graft. Repeat blood culture obtained from 6/22 to ensure clearance.   Infected AV graft- s/p revision by Vascular surgery on 6/22.  CAD- patient has severe three-vessel disease, stable.  ESRD- patient currently on hemodialysis Tuesday Thursday and Saturday. Nephrology following  Code Status: Full code Family Communication: No family at bedside Disposition Plan: Home when medically stable   Consultants:  Nephrology  Vascular surgery  Infectious disease  Procedures:  Revision of AVG on 6/22  Antibiotics:  vanc initially  Currently cefazolin through HD  HPI/Subjective: 80 y.o. female with history of end-stage renal disease on dialysis Tuesday Thursdays and Saturdays. States that she recently had some work done on her left axis site roughly a month ago. Since then she has noticed recently within the last several days some discharge from her site. Blood cultures were obtained. And recently resulted positive both of them as such patient was referred to the ER for further evaluation recommendations. Currently patient's only complaint is chills. The problem has been persistent since onset and getting worse. Nothing she is aware of makes it better or worse  Patient complains of mild arm pain after avg revision today, overall feeling better.  Objective: Filed Vitals:   04/12/15 1642  BP: 101/62  Pulse: 86  Temp: 97.8 F (36.6 C)  Resp: 17    Intake/Output Summary (Last 24 hours) at 04/12/15 1843 Last data filed at 04/12/15 1747  Gross per 24 hour  Intake   1260 ml  Output   2760 ml  Net  -1500 ml    Filed Weights   04/11/15 1444 04/11/15 1800 04/11/15 2042  Weight: 52.5 kg (115 lb 11.9 oz) 51.9 kg (114 lb 6.7 oz) 54.84 kg (120 lb 14.4 oz)    Exam:   General:  Appear in no acute distress  Cardiovascular: S1S2 RRR  Respiratory: clear bilaterally  Abdomen: Soft, nontender, no organomegaly  Musculoskeletal: Left arm fistula s/p revision surgery on 6/22.  Data Reviewed: Basic Metabolic Panel:  Recent Labs Lab 04/09/15 2211 04/10/15 1824 04/11/15 0708 04/12/15 0545  NA 141 141 145 140  K 3.5 4.6 4.2 3.8  CL 100* 106 113* 100*  CO2 27 25 22 27   GLUCOSE 148* 161* 97 96  BUN 29* 40* 43* 23*  CREATININE 4.23* 4.81* 5.03* 3.67*  CALCIUM 9.3 9.3 9.4 8.7*   Liver Function Tests:  Recent Labs Lab 04/09/15 2211 04/10/15 1824  AST 81* 77*  ALT 85* 84*  ALKPHOS 75 75  BILITOT 0.2* 0.3  PROT 6.3* 6.3*  ALBUMIN 3.4* 3.5   No results for input(s): LIPASE, AMYLASE in the last 168 hours. No results for input(s): AMMONIA in the last 168 hours. CBC:  Recent Labs Lab 04/09/15 2211 04/10/15 1824 04/11/15 0708 04/12/15 0545  WBC 9.6 9.1 10.1 8.3  NEUTROABS 5.5 5.7  --   --   HGB 11.2* 10.7* 11.8* 10.5*  HCT 33.6* 32.7* 36.1 31.9*  MCV 93.1 92.1 93.3 91.4  PLT 158 149* 161 137*    CBG:  Recent Labs Lab 04/11/15 0013  GLUCAP 126*    Recent Results (from the past 240 hour(s))  Blood Culture (routine x 2)  Status: None   Collection Time: 04/09/15 10:00 PM  Result Value Ref Range Status   Specimen Description BLOOD RIGHT ARM  Final   Special Requests BOTTLES DRAWN AEROBIC ONLY [place]  Final   Culture  Setup Time   Final    GRAM POSITIVE COCCI IN CLUSTERS AEROBIC BOTTLE ONLY CRITICAL RESULT CALLED TO, READ BACK BY AND VERIFIED WITH: L MILLER 04/10/15 @ [place]  Final   Report Status 04/12/2015 FINAL  Final   Organism ID, Bacteria STAPHYLOCOCCUS AUREUS  Final      Susceptibility   Staphylococcus aureus - MIC*     CIPROFLOXACIN <=0.5 SENSITIVE Sensitive     ERYTHROMYCIN >=8 RESISTANT Resistant     GENTAMICIN <=0.5 SENSITIVE Sensitive     OXACILLIN 0.5 SENSITIVE Sensitive     TETRACYCLINE <=1 SENSITIVE Sensitive     VANCOMYCIN 1 SENSITIVE Sensitive     TRIMETH/SULFA <=10 SENSITIVE Sensitive     CLINDAMYCIN <=0.25 SENSITIVE Sensitive     RIFAMPIN <=0.5 SENSITIVE Sensitive     Inducible Clindamycin NEGATIVE Sensitive     * STAPHYLOCOCCUS AUREUS  Blood Culture (routine x 2)     Status: None   Collection Time: 04/09/15 10:22 PM  Result Value Ref Range Status   Specimen Description BLOOD RIGHT ARM  Final   Special Requests BOTTLES DRAWN AEROBIC AND ANAEROBIC 5CC EACH  Final   Culture  Setup Time   Final    GRAM POSITIVE COCCI IN CLUSTERS IN BOTH AEROBIC AND ANAEROBIC BOTTLES CRITICAL RESULT CALLED TO, READ BACK BY AND VERIFIED WITH: L MILLER 04/10/15 @ 66 M VESTAL    Culture   Final    STAPHYLOCOCCUS AUREUS SUSCEPTIBILITIES PERFORMED ON PREVIOUS CULTURE WITHIN THE LAST 5 DAYS.    Report Status 04/12/2015 FINAL  Final  MRSA PCR Screening     Status: None   Collection Time: 04/11/15  1:04 AM  Result Value Ref Range Status   MRSA by PCR NEGATIVE NEGATIVE Final    Comment:        The GeneXpert MRSA Assay (FDA approved for NASAL specimens only), is one component of a comprehensive MRSA colonization surveillance program. It is not intended to diagnose MRSA infection nor to guide or monitor treatment for MRSA infections.      Studies: Dg Chest 2 View  04/10/2015   CLINICAL DATA:  Positive blood cultures.  EXAM: CHEST  2 VIEW  COMPARISON:  12/13/2013  FINDINGS: Prior CABG. Heart is normal size. No confluent airspace opacities or effusions. No acute bony abnormality.  IMPRESSION: No active cardiopulmonary disease.   Electronically Signed   By: Rolm Baptise M.D.   On: 04/10/2015 21:52    Scheduled Meds: . aspirin  81 mg Oral Once per day on Sun Mon Wed Fri  . calcium acetate  667 mg Oral TID  WC  . [START ON 04/13/2015]  ceFAZolin (ANCEF) IV  2 g Intravenous Q T,Th,Sa-HD  . fentaNYL      . gabapentin  200 mg Oral QHS  . heparin  5,000 Units Subcutaneous 3 times per day  . hydrOXYzine  50 mg Oral QHS  . multivitamin  1 tablet Oral QHS  . sodium chloride  3 mL Intravenous Q12H   Continuous Infusions: . sodium chloride 20 mL/hr at 04/12/15 0940    Principal Problem:   Bacteremia Active Problems:   CAD (coronary artery disease): Severe three vessel   ESRD (end stage renal disease) on dialysis  Time spent: 25 min    Dontai Pember  [facility] Pager [phone]. If 7PM-7AM, please contact night-coverage at [email or web address], password [facility] 04/12/2015, 6:43 PM  LOS: 2 days

## 2015-04-12 NOTE — Interval H&P Note (Signed)
History and Physical Interval Note:  04/12/2015 9:24 AM  Diane [name] Diane [name]  has presented today for surgery, with the diagnosis of End Stage Renal Disease N18.6  The various methods of treatment have been discussed with the patient and family. After consideration of risks, benefits and other options for treatment, the patient has consented to  Procedure(s): REVISION OF ARTERIOVENOUS GORETEX GRAFT (Left) as a surgical intervention .  The patient's history has been reviewed, patient examined, no change in status, stable for surgery.  I have reviewed the patient's chart and labs.  Questions were answered to the patient's satisfaction.     [name]

## 2015-04-12 NOTE — Anesthesia Postprocedure Evaluation (Signed)
  Anesthesia Post-op Note  Patient: Diane [name]  Procedure(s) Performed: Procedure(s): REVISION WITH INSERTION OF NEW 6 MM GORTEX GRAFT ,REMOVAL INFECTED SEGMENT  AV GORTEX GRAFT LEFT UPPER ARM (Left)  Patient Location: PACU  Anesthesia Type:General  Level of Consciousness: awake, alert , oriented and patient cooperative  Airway and Oxygen Therapy: Patient Spontanous Breathing and Patient connected to nasal cannula oxygen  Post-op Pain: none  Post-op Assessment: Post-op Vital signs reviewed, Patient's Cardiovascular Status Stable, Respiratory Function Stable, Patent Airway, No signs of Nausea or vomiting and Pain level controlled              Post-op Vital Signs: Reviewed and stable  Last Vitals:  Filed Vitals:   04/12/15 1311  BP: 102/62  Pulse: 80  Temp: 36.7 C  Resp: 17    Complications: No apparent anesthesia complications

## 2015-04-12 NOTE — H&P (View-Only) (Signed)
Patient ID: Diane [name], female   DOB: 11/12/1934, 80 y.o.   MRN: [id number] The patient was sent for for surgery today. She did not understand I would not be doing her surgery. I had discussed this with her yesterday morning when I explained the plan for surgery. She is very tearful. This is her baseline. She is confused and doesn't understand why she needs surgery and while I can't do it. I explained that I do not have OR time until next Friday and that this cannot wait. I explained that she is artery having positive blood cultures and need to have this infected segment of Gore-Tex removed. I explained that Dr. Kellie Simmering would be doing the procedure. She was confused regarding the routine informed consent with questions regarding recording and photography. I explained that this is the usual consent that everyone has to sign. She said that she had never seen this before and I explained to her that she is sinus on multiple occasions symphysis menti infections February 2013. She is willing to have Dr. Kellie Simmering do her procedure today after she has an opportunity to speak with her daughter. Nursing staff is attempting to contact her daughter. She has not answered her cell phone and they're trying to call her at home.

## 2015-04-12 NOTE — Anesthesia Preprocedure Evaluation (Addendum)
Anesthesia Evaluation  Patient identified by MRN, date of birth, ID band Patient awake    Reviewed: Allergy & Precautions, NPO status , Patient's Chart, lab work & pertinent test results  History of Anesthesia Complications Negative for: history of anesthetic complications  Airway Mallampati: I  TM Distance: >3 FB Neck ROM: Full    Dental  (+) Edentulous Upper, Edentulous Lower   Pulmonary neg pulmonary ROS,  breath sounds clear to auscultation        Cardiovascular - angina+ CAD and + CABG (2013) Rhythm:Regular Rate:Normal  04/11/15/ ECHO: EF 60-65%, valves OK   Neuro/Psych negative neurological ROS     GI/Hepatic negative GI ROS, Elevated LFTs   Endo/Other    Renal/GU CRF, ESRF and DialysisRenal disease (K+ 3.8)     Musculoskeletal   Abdominal   Peds  Hematology  (+) Blood dyscrasia (Hb 10.5), ,   Anesthesia Other Findings   Reproductive/Obstetrics                           Anesthesia Physical Anesthesia Plan  ASA: III  Anesthesia Plan: General   Post-op Pain Management:    Induction: Intravenous  Airway Management Planned: LMA  Additional Equipment:   Intra-op Plan:   Post-operative Plan:   Informed Consent: I have reviewed the patients History and Physical, chart, labs and discussed the procedure including the risks, benefits and alternatives for the proposed anesthesia with the patient or authorized representative who has indicated his/her understanding and acceptance.   Dental advisory given  Plan Discussed with: CRNA and Surgeon  Anesthesia Plan Comments: (Plan routine monitors, GA- LMA)        Anesthesia Quick Evaluation

## 2015-04-12 NOTE — Progress Notes (Signed)
Patient ID: MARIT GOODWILL, female   DOB: 19-Feb-1935, 80 y.o.   MRN: [id number] The patient was sent for for surgery today. She did not understand I would not be doing her surgery. I had discussed this with her yesterday morning when I explained the plan for surgery. She is very tearful. This is her baseline. She is confused and doesn't understand why she needs surgery and while I can't do it. I explained that I do not have OR time until next Friday and that this cannot wait. I explained that she is artery having positive blood cultures and need to have this infected segment of Gore-Tex removed. I explained that Dr. Kellie Simmering would be doing the procedure. She was confused regarding the routine informed consent with questions regarding recording and photography. I explained that this is the usual consent that everyone has to sign. She said that she had never seen this before and I explained to her that she is sinus on multiple occasions symphysis menti infections February 2013. She is willing to have Dr. Kellie Simmering do her procedure today after she has an opportunity to speak with her daughter. Nursing staff is attempting to contact her daughter. She has not answered her cell phone and they're trying to call her at home.

## 2015-04-13 ENCOUNTER — Encounter (HOSPITAL_COMMUNITY): Payer: Self-pay | Admitting: Vascular Surgery

## 2015-04-13 LAB — CBC WITH DIFFERENTIAL/PLATELET
Basophils Absolute: 0 10*3/uL (ref 0.0–0.1)
Basophils Relative: 1 % (ref 0–1)
EOS ABS: 0.4 10*3/uL (ref 0.0–0.7)
Eosinophils Relative: 5 % (ref 0–5)
HEMATOCRIT: 29.2 % — AB (ref 36.0–46.0)
HEMOGLOBIN: 9.7 g/dL — AB (ref 12.0–15.0)
LYMPHS ABS: 2 10*3/uL (ref 0.7–4.0)
Lymphocytes Relative: 27 % (ref 12–46)
MCH: 30.4 pg (ref 26.0–34.0)
MCHC: 33.2 g/dL (ref 30.0–36.0)
MCV: 91.5 fL (ref 78.0–100.0)
MONO ABS: 0.7 10*3/uL (ref 0.1–1.0)
MONOS PCT: 9 % (ref 3–12)
Neutro Abs: 4.5 10*3/uL (ref 1.7–7.7)
Neutrophils Relative %: 58 % (ref 43–77)
Platelets: 142 10*3/uL — ABNORMAL LOW (ref 150–400)
RBC: 3.19 MIL/uL — AB (ref 3.87–5.11)
RDW: 13.5 % (ref 11.5–15.5)
WBC: 7.5 10*3/uL (ref 4.0–10.5)

## 2015-04-13 LAB — RENAL FUNCTION PANEL
Albumin: 2.9 g/dL — ABNORMAL LOW (ref 3.5–5.0)
Anion gap: 12 (ref 5–15)
BUN: 40 mg/dL — ABNORMAL HIGH (ref 6–20)
CO2: 24 mmol/L (ref 22–32)
Calcium: 9 mg/dL (ref 8.9–10.3)
Chloride: 103 mmol/L (ref 101–111)
Creatinine, Ser: 5.34 mg/dL — ABNORMAL HIGH (ref 0.44–1.00)
GFR, EST AFRICAN AMERICAN: 8 mL/min — AB (ref >60)
GFR, EST NON AFRICAN AMERICAN: 7 mL/min — AB (ref >60)
Glucose, Bld: 143 mg/dL — ABNORMAL HIGH (ref 65–99)
PHOSPHORUS: 5.7 mg/dL — AB (ref 2.5–4.6)
POTASSIUM: 4 mmol/L (ref 3.5–5.1)
Sodium: 139 mmol/L (ref 135–145)

## 2015-04-13 MED ORDER — SENNOSIDES-DOCUSATE SODIUM 8.6-50 MG PO TABS
2.0000 | ORAL_TABLET | Freq: Two times a day (BID) | ORAL | Status: DC
  Administered 2015-04-13 – 2015-04-15 (×5): 2 via ORAL
  Filled 2015-04-13 (×7): qty 2

## 2015-04-13 MED ORDER — POLYETHYLENE GLYCOL 3350 17 G PO PACK
17.0000 g | PACK | Freq: Every day | ORAL | Status: DC
  Administered 2015-04-13 – 2015-04-15 (×3): 17 g via ORAL
  Filled 2015-04-13 (×3): qty 1

## 2015-04-13 NOTE — Evaluation (Signed)
Physical Therapy Evaluation Patient Details Name: Diane [name] MRN: [id number] DOB: December 22, 1934 Today's Date: 04/13/2015   History of Present Illness  80 y.o. female with history of end-stage renal disease on dialysis Tuesday Thursdays and Saturdays. States that she recently had some work done on her left axis site roughly a month ago. Since then she has noticed recently within the last several days some discharge from her site. Blood cultures were obtained. And recently resulted positive both of them as such patient was referred to the ER for further evaluation recommendations. Currently patient's only complaint is chills. The problem has been persistent since onset and getting worse. s/p revision of AVGG    Clinical Impression  Pt presents with the below listed impairments and will benefit from skilled PT intervention to address these impairments. Question mild cognitive deficits which could be baseline but no family present. Pt does not have support at home and would benefit from at least intermittent S level of assist. Pt expressed concern in regards to d/c home by herself but states that family is not available to assist her past discharge (if this is not the case, could d/c home with HHPT). Pt agreeable to short term rehab stay to increase strength and functional mobility. She states she has done this in the past.     Follow Up Recommendations SNF;Supervision/Assistance - 24 hour    Equipment Recommendations  None recommended by PT    Recommendations for Other Services       Precautions / Restrictions Restrictions Weight Bearing Restrictions: No      Mobility  Bed Mobility Overal bed mobility: Modified Independent             General bed mobility comments: mod I for supine <-> sit and pt used bedrails for support  Transfers Overall transfer level: Needs assistance Equipment used: None Transfers: Sit to/from Stand Sit to Stand: Min guard         General  transfer comment: steady A for balance initially   Ambulation/Gait Ambulation/Gait assistance: Min guard Ambulation Distance (Feet): 100 Feet Assistive device: None Gait Pattern/deviations: Step-through pattern;Decreased stride length     General Gait Details: as fatigued pt became slightly unsteady with gait requiring steady A for balance  Stairs            Wheelchair Mobility    Modified Rankin (Stroke Patients Only)       Balance Overall balance assessment: Needs assistance Sitting-balance support: No upper extremity supported Sitting balance-Leahy Scale: Normal     Standing balance support: Single extremity supported;No upper extremity supported Standing balance-Leahy Scale: Good Standing balance comment: decreased standing tolerance and fatigues easily                             Pertinent Vitals/Pain Pain Assessment: Faces Faces Pain Scale: Hurts a little bit Pain Location: L arm fistula site Pain Intervention(s): Monitored during session    [place] expects to be discharged to:: Unsure Living Arrangements: Alone               Additional Comments: spouse is at long term SNF at [facility] for about a year per patient report. She states she has no support at home and her children wouldn't be able to provide much assist and definitely not assist with drivign to/from HD or staying with patient. She has concerns about being by herself "in her state"    Prior Function Level of  Independence: Independent               Hand Dominance        Extremity/Trunk Assessment   Upper Extremity Assessment: LUE deficits/detail       LUE Deficits / Details: limited due to pain in fistula site   Lower Extremity Assessment: Generalized weakness      Cervical / Trunk Assessment: Normal  Communication   Communication: No difficulties  Cognition Arousal/Alertness: Awake/alert Behavior During Therapy: WFL for tasks  assessed/performed Overall Cognitive Status: No family/caregiver present to determine baseline cognitive functioning (Pt reports her children managed her money and paperwork) Area of Impairment: Memory     Memory: Decreased short-term memory         General Comments: repeated things multiple times throughout session. Appears to be somewhat tangential in her thoughts and needs to be redirected to task.    General Comments General comments (skin integrity, edema, etc.): some edema at L fistula site    Exercises        Assessment/Plan    PT Assessment Patient needs continued PT services  PT Diagnosis Difficulty walking;Generalized weakness;Acute pain   PT Problem List Decreased strength;Decreased activity tolerance;Decreased balance;Decreased mobility;Decreased cognition;Decreased skin integrity;Pain  PT Treatment Interventions DME instruction;Gait training;Functional mobility training;Stair training;Therapeutic activities;Therapeutic exercise;Balance training;Neuromuscular re-education;Cognitive remediation;Patient/family education   PT Goals (Current goals can be found in the Care Plan section) Acute Rehab PT Goals Patient Stated Goal: "find out who is paying for my HD" PT Goal Formulation: With patient Time For Goal Achievement: 04/27/15 Potential to Achieve Goals: Good    Frequency Min 3X/week   Barriers to discharge Decreased caregiver support Pt lives alone and does not have family support. Question mild cognitive impairments? Pt would benefit from at the least intermittent S in the home especially due to HD patient requiring transportation (she was driving herself).    Co-evaluation               End of Session Equipment Utilized During Treatment: Gait belt Activity Tolerance: Patient tolerated treatment well Patient left: in bed;with call bell/phone within reach Nurse Communication: Mobility status;Other (comment) (IV site leaking)         Time:  3419-3790 PT Time Calculation (min) (ACUTE ONLY): 28 min   Charges:   PT Evaluation $Initial PT Evaluation Tier I: 1 Procedure PT Treatments $Gait Training: 8-22 mins   PT G Codes:        Canary Brim Ivory Broad, PT, DPT Pager #: [phone] 04/13/2015, 2:45 PM

## 2015-04-13 NOTE — Progress Notes (Signed)
[facility] PROGRESS NOTE  BETTIE CAPISTRAN OIB:[id number] DOB: 09-Jul-1935 DOA: 04/10/2015 PCP: Donnajean Lopes, MD  Assessment/Plan:  Bacteremia- patient has 2 out of 2 positive blood cultures growing MSSA. ID consulted and recommended abx changed to cefazolin during HD till 8/2 for total of 6 weeks treatment. vancomycin d/ced. Likely source from the infected graft. Repeat blood culture obtained from 6/22 to ensure clearance.  Infected AV graft- s/p revision by Vascular surgery on 6/22. On abx.  CAD- patient has severe three-vessel disease, stable.  ESRD- patient currently on hemodialysis Tuesday Thursday and Saturday. Nephrology following  Constipation: stool softener.    Code Status: Full code Family Communication: No family at bedside Disposition Plan: Home when medically stable   Consultants:  Nephrology  Vascular surgery  Infectious disease  Procedures:  Revision of AVG on 6/22  Antibiotics:  vanc initially  Currently cefazolin through HD  HPI/Subjective: 80 y.o. female with history of end-stage renal disease on dialysis Tuesday Thursdays and Saturdays. States that she recently had some work done on her left axis site roughly a month ago. Since then she has noticed recently within the last several days some discharge from her site. Blood cultures were obtained. And recently resulted positive both of them as such patient was referred to the ER for further evaluation recommendations. Currently patient's only complaint is chills. The problem has been persistent since onset and getting worse. Nothing she is aware of makes it better or worse  Patient complains of constipation, overall feeling better. Denies left arm pain.  Objective: Filed Vitals:   04/13/15 0700  BP: 95/39  Pulse: 106  Temp: 98.4 F (36.9 C)  Resp: 17    Intake/Output Summary (Last 24 hours) at 04/13/15 0936 Last data filed at 04/13/15 0850  Gross per 24 hour  Intake   1400 ml   Output   1485 ml  Net    -85 ml   Filed Weights   04/11/15 1444 04/11/15 1800 04/11/15 2042  Weight: 52.5 kg (115 lb 11.9 oz) 51.9 kg (114 lb 6.7 oz) 54.84 kg (120 lb 14.4 oz)    Exam:   General:  Appear in no acute distress  Cardiovascular: S1S2 RRR  Respiratory: clear bilaterally  Abdomen: Soft, nontender, no organomegaly  Musculoskeletal: Left arm fistula s/p revision surgery on 6/22. Dressing intact, non tender.  Data Reviewed: Basic Metabolic Panel:  Recent Labs Lab 04/09/15 2211 04/10/15 1824 04/11/15 0708 04/12/15 0545  NA 141 141 145 140  K 3.5 4.6 4.2 3.8  CL 100* 106 113* 100*  CO2 27 25 22 27   GLUCOSE 148* 161* 97 96  BUN 29* 40* 43* 23*  CREATININE 4.23* 4.81* 5.03* 3.67*  CALCIUM 9.3 9.3 9.4 8.7*   Liver Function Tests:  Recent Labs Lab 04/09/15 2211 04/10/15 1824  AST 81* 77*  ALT 85* 84*  ALKPHOS 75 75  BILITOT 0.2* 0.3  PROT 6.3* 6.3*  ALBUMIN 3.4* 3.5   No results for input(s): LIPASE, AMYLASE in the last 168 hours. No results for input(s): AMMONIA in the last 168 hours. CBC:  Recent Labs Lab 04/09/15 2211 04/10/15 1824 04/11/15 0708 04/12/15 0545  WBC 9.6 9.1 10.1 8.3  NEUTROABS 5.5 5.7  --   --   HGB 11.2* 10.7* 11.8* 10.5*  HCT 33.6* 32.7* 36.1 31.9*  MCV 93.1 92.1 93.3 91.4  PLT 158 149* 161 137*    CBG:  Recent Labs Lab 04/11/15 0013  GLUCAP 126*    Recent Results (from the  past 240 hour(s))  Blood Culture (routine x 2)     Status: None   Collection Time: 04/09/15 10:00 PM  Result Value Ref Range Status   Specimen Description BLOOD RIGHT ARM  Final   Special Requests BOTTLES DRAWN AEROBIC ONLY [place]  Final   Culture  Setup Time   Final    GRAM POSITIVE COCCI IN CLUSTERS AEROBIC BOTTLE ONLY CRITICAL RESULT CALLED TO, READ BACK BY AND VERIFIED WITH: L MILLER 04/10/15 @ [place]  Final   Report Status 04/12/2015 FINAL  Final   Organism ID, Bacteria STAPHYLOCOCCUS AUREUS   Final      Susceptibility   Staphylococcus aureus - MIC*    CIPROFLOXACIN <=0.5 SENSITIVE Sensitive     ERYTHROMYCIN >=8 RESISTANT Resistant     GENTAMICIN <=0.5 SENSITIVE Sensitive     OXACILLIN 0.5 SENSITIVE Sensitive     TETRACYCLINE <=1 SENSITIVE Sensitive     VANCOMYCIN 1 SENSITIVE Sensitive     TRIMETH/SULFA <=10 SENSITIVE Sensitive     CLINDAMYCIN <=0.25 SENSITIVE Sensitive     RIFAMPIN <=0.5 SENSITIVE Sensitive     Inducible Clindamycin NEGATIVE Sensitive     * STAPHYLOCOCCUS AUREUS  Blood Culture (routine x 2)     Status: None   Collection Time: 04/09/15 10:22 PM  Result Value Ref Range Status   Specimen Description BLOOD RIGHT ARM  Final   Special Requests BOTTLES DRAWN AEROBIC AND ANAEROBIC 5CC EACH  Final   Culture  Setup Time   Final    GRAM POSITIVE COCCI IN CLUSTERS IN BOTH AEROBIC AND ANAEROBIC BOTTLES CRITICAL RESULT CALLED TO, READ BACK BY AND VERIFIED WITH: L MILLER 04/10/15 @ 92 M [place]ST[place]    Culture   Final    STAPHYLOCOCCUS AUREUS SUSCEPTIBILITIES PERFORMED ON PREVIOUS CULTURE WITHIN THE LAST 5 DAYS.    Report Status 04/12/2015 FINAL  Final  MRSA PCR Screening     Status: None   Collection Time: 04/11/15  1:04 AM  Result Value Ref Range Status   MRSA by PCR NEGATIVE NEGATIVE Final    Comment:        The GeneXpert MRSA Assay (FDA approved for NASAL specimens only), is one component of a comprehensive MRSA colonization surveillance program. It is not intended to diagnose MRSA infection nor to guide or monitor treatment for MRSA infections.      Studies: No results found.  Scheduled Meds: . aspirin  81 mg Oral Once per day on Sun Mon Wed Fri  . calcium acetate  667 mg Oral TID WC  .  ceFAZolin (ANCEF) IV  2 g Intravenous Q T,Th,Sa-HD  . gabapentin  200 mg Oral QHS  . heparin  5,000 Units Subcutaneous 3 times per day  . hydrOXYzine  50 mg Oral QHS  . multivitamin  1 tablet Oral QHS  . polyethylene glycol  17 g Oral Daily  . senna-docusate  2  tablet Oral BID  . sodium chloride  3 mL Intravenous Q12H   Continuous Infusions: . sodium chloride 20 mL/hr at 04/12/15 0940    Principal Problem:   Bacteremia Active Problems:   CAD (coronary artery disease): Severe three vessel   ESRD (end stage renal disease) on dialysis    Time spent: 25 min    Diane [name]  [facility] Pager [phone]. If 7PM-7AM, please contact night-coverage at [email or web address], password [facility] 04/13/2015, 9:36 AM  LOS: 3 days

## 2015-04-13 NOTE — Progress Notes (Addendum)
  [facility] Progress Note  Subjective  - POD #1  Having some soreness with left incision. Denies hand pain. Denies fever or chills.   Objective Filed Vitals:   04/13/15 0700  BP: 95/39  Pulse: 106  Temp: 98.4 F (36.9 C)  Resp: 17   Tmax 98.7 BP sys 90s-100s 02 100% RA  Intake/Output Summary (Last 24 hours) at 04/13/15 0906 Last data filed at 04/13/15 0850  Gross per 24 hour  Intake   1400 ml  Output   1485 ml  Net    -85 ml    Left upper arm graft with palpable thrill and audible bruit. Clear tegaderm dressing in place. No drainage seen on gauze. Incisions intact.   Assessment/Planning: 80 y.o. female is s/p: revision of left upper arm AVGG with insertion of new segment of goretex graft, removal of infected segment  1 Day Post-Op   Graft is patent.  For HD today. Cannulate proximally away dressing.   Afebrile without leukocytosis. On cefazolin with HD.   Alvia Grove 04/13/2015 9:06 AM --  Laboratory CBC    Component Value Date/Time   WBC 8.3 04/12/2015 0545   HGB 10.5* 04/12/2015 0545   HCT 31.9* 04/12/2015 0545   PLT 137* 04/12/2015 0545    BMET    Component Value Date/Time   NA 140 04/12/2015 0545   K 3.8 04/12/2015 0545   CL 100* 04/12/2015 0545   CO2 27 04/12/2015 0545   GLUCOSE 96 04/12/2015 0545   BUN 23* 04/12/2015 0545   CREATININE 3.67* 04/12/2015 0545   CREATININE 4.92* 08/23/2013 0952   CALCIUM 8.7* 04/12/2015 0545   GFRNONAA 11* 04/12/2015 0545   GFRAA 13* 04/12/2015 0545    COAG Lab Results  Component Value Date   INR 1.05 04/12/2015   INR 1.30 08/14/2012   INR 1.01 08/12/2012   No results found for: PTT  Antibiotics Anti-infectives    Start     Dose/Rate Route Frequency Ordered Stop   04/13/15 1200  ceFAZolin (ANCEF) IVPB 2 g/50 mL premix     2 g 100 mL/hr over 30 Minutes Intravenous Every T-Th-Sa (Hemodialysis) 04/12/15 1453     04/12/15 1600  ceFAZolin (ANCEF) IVPB 2 g/50 mL premix     2  g 100 mL/hr over 30 Minutes Intravenous  Once 04/12/15 1453 04/12/15 1808   04/11/15 1200  vancomycin (VANCOCIN) 500 mg in sodium chloride 0.9 % 100 mL IVPB  Status:  Discontinued     500 mg 100 mL/hr over 60 Minutes Intravenous Every T-Th-Sa (Hemodialysis) 04/10/15 2138 04/12/15 1404   04/10/15 2200  vancomycin (VANCOCIN) IVPB 1000 mg/200 mL premix     1,000 mg 200 mL/hr over 60 Minutes Intravenous  Once 04/10/15 2138 04/10/15 2327       Virgina Jock, PA-C [facility] Office: [phone] Pager: [phone] 04/13/2015 9:06 AM  Agree OK to use proximal portion of AVG---not new segment I will see her in office in 2 weeks

## 2015-04-13 NOTE — Progress Notes (Addendum)
Subjective:  Co some post op  L arm discomfort/ Hd for today  Objective Vital signs in last 24 hours: Filed Vitals:   04/12/15 1311 04/12/15 1642 04/12/15 2058 04/13/15 0442  BP: 102/62 101/62 96/45 95/50   Pulse: 80 86 85 94  Temp: 98 F (36.7 C) 97.8 F (36.6 C) 98.4 F (36.9 C) 98.7 F (37.1 C)  TempSrc: Oral Oral Oral Oral  Resp: 17 17 16 18   Height:      Weight:      SpO2: 100% 100% 100% 95%   Weight change:   Physical Exam: General: Stable this am /not as anxious/ Ox3 Not in distress Heart: RRR, no mur, gallop or rub Lungs: CTA bilat, non labored breathing Abdomen: BS pos. Soft, NT, ND Extremities: no pedal edema Dialysis Access: L UA AVGG pos bruit / Dressing dry and clean    OP Dialysis Orders: Center: [facility] on TTS. EDW 53.5 HD Bath 2k/ 2.25 ca Time 3.5 hr Heparin 3000. Access LUA AVGG  Last Mircera 03/07/15 Units IV/HD Venofer 0  Other op labs = 04/06/15 hgb 11.4 Ca 9.4 phos 5.2 pth 149  Problem/Plan  1. Staph aureus bacteremia [facility] 04/09/15 [name]) MSSA = with L lower portion Avgg Infected/yest Dr. Victorino December / on Ancef now  Per ID last dose Aug 1 2. ESRD - TTS HD [facility] = HD today  Use upper arm portion avg 3. Hypertension/volume - bp 95/50 about baseline  asymp / walking in room to Bathroom /  no bp meds as op and no Midodrine / no change in  no vol issues no wts since 6/21 documented  Weigh pre post hd 4. Anemia - hgb 10.5 no esa or fe op lab/ Fu cbcs- 5. Metabolic bone disease - 8.7 Ca / phoslo binder No vit d  6. Nutrition - alb 3.5 Renal diet and renal vit 7. HO Bipolar/Anxiety- less anxious than yest  Has prn Tranxene / about Baseline ms now   Ernest Haber, PA-C [place] [phone] 04/13/2015,8:18 AM  LOS: 3 days    Patient seen and examined, agree with above note with above modifications. Getting a bath at present- NAD- plan is to use upper portion of AVG today with HD- I  anticipate discharge soon  [name], MD 04/13/2015     Labs: Basic Metabolic Panel:  Recent Labs Lab 04/10/15 1824 04/11/15 0708 04/12/15 0545  NA 141 145 140  K 4.6 4.2 3.8  CL 106 113* 100*  CO2 25 22 27   GLUCOSE 161* 97 96  BUN 40* 43* 23*  CREATININE 4.81* 5.03* 3.67*  CALCIUM 9.3 9.4 8.7*   Liver Function Tests:  Recent Labs Lab 04/09/15 2211 04/10/15 1824  AST 81* 77*  ALT 85* 84*  ALKPHOS 75 75  BILITOT 0.2* 0.3  PROT 6.3* 6.3*  ALBUMIN 3.4* 3.5   No results for input(s): LIPASE, AMYLASE in the last 168 hours. No results for input(s): AMMONIA in the last 168 hours. CBC:  Recent Labs Lab 04/09/15 2211 04/10/15 1824 04/11/15 0708 04/12/15 0545  WBC 9.6 9.1 10.1 8.3  NEUTROABS 5.5 5.7  --   --   HGB 11.2* 10.7* 11.8* 10.5*  HCT 33.6* 32.7* 36.1 31.9*  MCV 93.1 92.1 93.3 91.4  PLT 158 149* 161 137*   Cardiac Enzymes: No results for input(s): CKTOTAL, CKMB, CKMBINDEX, TROPONINI in the last 168 hours. CBG:  Recent Labs Lab 04/11/15 0013  GLUCAP 126*    Studies/Results: No  results found. Medications: . sodium chloride 20 mL/hr at 04/12/15 0940   . aspirin  81 mg Oral Once per day on Sun Mon Wed Fri  . calcium acetate  667 mg Oral TID WC  .  ceFAZolin (ANCEF) IV  2 g Intravenous Q T,Th,Sa-HD  . gabapentin  200 mg Oral QHS  . heparin  5,000 Units Subcutaneous 3 times per day  . hydrOXYzine  50 mg Oral QHS  . multivitamin  1 tablet Oral QHS  . sodium chloride  3 mL Intravenous Q12H

## 2015-04-14 DIAGNOSIS — R627 Adult failure to thrive: Secondary | ICD-10-CM

## 2015-04-14 LAB — CBC
HEMATOCRIT: 28.5 % — AB (ref 36.0–46.0)
HEMOGLOBIN: 9.6 g/dL — AB (ref 12.0–15.0)
MCH: 30.9 pg (ref 26.0–34.0)
MCHC: 33.7 g/dL (ref 30.0–36.0)
MCV: 91.6 fL (ref 78.0–100.0)
Platelets: 126 10*3/uL — ABNORMAL LOW (ref 150–400)
RBC: 3.11 MIL/uL — AB (ref 3.87–5.11)
RDW: 13.5 % (ref 11.5–15.5)
WBC: 7.8 10*3/uL (ref 4.0–10.5)

## 2015-04-14 MED ORDER — MIDODRINE HCL 5 MG PO TABS
10.0000 mg | ORAL_TABLET | Freq: Two times a day (BID) | ORAL | Status: DC
  Administered 2015-04-14 – 2015-04-15 (×2): 10 mg via ORAL
  Filled 2015-04-14 (×4): qty 2

## 2015-04-14 MED ORDER — DARBEPOETIN ALFA 60 MCG/0.3ML IJ SOSY
60.0000 ug | PREFILLED_SYRINGE | INTRAMUSCULAR | Status: DC
  Administered 2015-04-15: 60 ug via INTRAVENOUS
  Filled 2015-04-14: qty 0.3

## 2015-04-14 NOTE — Clinical Social Work Note (Signed)
CSW completed FL2 and faxed out patient to [facility] SNFs per patient request, awaiting bed offers and level 2 passar number on patient.  FL2 is on chart awaiting signature for physician.    Jones Broom. [place], MSW, [place] 04/14/2015 4:08 PM

## 2015-04-14 NOTE — Progress Notes (Signed)
Pt requires a manuel review to get government PASARR number- she is unable to DC until we receive this number as of 6/24 at 5pm we had not gotten a number back- PASARR manuel review is closed over weekend likely pt cannot DC to SNF until Monday.  CSW will continue to follow.  Domenica Reamer, [place] Social Worker [phone]

## 2015-04-14 NOTE — Care Management Note (Signed)
Case Management Note  Patient Details  Name: Diane [name] MRN: [id number] Date of Birth: Jan 09, 1935  Subjective/Objective:          CM following for progression and d/c planning.           Action/Plan: Met with pt who discussed at length her health issues and many problems surrounding her quality of life. Pt is very spiritual and is struggling with making the right decisions re her ongoing struggle with ESRD. Additionally her husband is a long term SNF pt and while she has 3 children and is very supported by two, they have major health issues of their own. CSW, Eliezer Lofts spent a significant amt of time with this pt and has requested that [facility] meet with this pt to assist her in making decisions.    Expected Discharge Date:  04/17/15               Expected Discharge Plan:  Skilled Nursing Facility  In-House Referral:  Clinical Social Work  Discharge planning Services  NA  Post Acute Care Choice:  NA Choice offered to:  NA  DME Arranged:    DME Agency:     HH Arranged:    HH Agency:     Status of Service:  In process, will continue to follow  Medicare Important Message Given:  Yes Date Medicare IM Given:  04/14/15 Medicare IM give by:  Jasmine Pang RN MPH, case manager, [phone] Date Additional Medicare IM Given:    Additional Medicare Important Message give by:     If discussed at [place] of Stay Meetings, dates discussed:    Additional Comments:  Adron Bene, RN 04/14/2015, 4:15 PM

## 2015-04-14 NOTE — Clinical Social Work Note (Signed)
Clinical Social Work Assessment  Patient Details  Name: Diane [name] MRN: [id number] Date of Birth: 01/12/1935  Date of referral:  04/14/15               Reason for consult:  Facility Placement                Permission sought to share information with:  Chartered certified accountant granted to share information::  Yes, Verbal Permission Granted  Name::        Agency::  [facility]  Relationship::     Contact Information:     Housing/Transportation Living arrangements for the past 2 months:  Single Family Home Source of Information:  Patient Patient Interpreter Needed:  None Criminal Activity/Legal Involvement Pertinent to Current Situation/Hospitalization:  No - Comment as needed Significant Relationships:  Adult Children Lives with:  Self Do you feel safe going back to the place where you live?  Yes Need for family participation in patient care:  No (Coment)  Care giving concerns:  Pt lives at home alone- has children who live locally but work full time and do not participate much in patient care according to pt   Social Worker assessment / plan:  CSW spoke with pt concerning PT recommendation for SNF  Employment status:  Retired Nurse, adult PT Recommendations:  [place] / Referral to community resources:  [place] [facility]  Patient/Family's Response to care: Pt was very emotional during CSWs conversation with her.  Pt became tearful almost immediately- states that she has been on dialysis for 3.5 years and hates going every time- states that it is painful and it was the worst thing she has ever gone through.  Pt states that she didn't want to start dialysis but she did so that she could continuing living at home with husband and help take care of him.  Pt reports that now her husband lives at [facility] and so she has no one at home.  States that she has nothing  to live for anymore and no quality of life- doesn't see much of her children or grandchildren, no longer able to go to church because dialysis makes her so tired that she can not enjoy church or have the motivation to go to church- states that no one from her church visits her anymore.  Pt states that she is a Engineer, manufacturing and believes that when she dies she will go to heaven- does not think that stopping dialysis would interfere with that- did not want to be dependent on machines and now feels like she is.  Patient/Family's Understanding of and Emotional Response to Diagnosis, Current Treatment, and Prognosis: pt is very emotional about about her condition and just wants to know if her blood infection is going to be fatal- states that she felt pressured into getting surgery in the first place and is not sure if she would want to continue therapies if the blood infection would be fatal  Emotional Assessment Appearance:  Appears stated age Attitude/Demeanor/Rapport:    Affect (typically observed):  Tearful/Crying, Anxious, Apprehensive, Overwhelmed, Sad Orientation:  Oriented to Self, Oriented to  Time, Oriented to Place, Oriented to Situation Alcohol / Substance use:  Not Applicable Psych involvement (Current and /or in the community):  No (Comment)  Discharge Needs  Concerns to be addressed:  Discharge Planning Concerns, Home Safety Concerns, Adjustment to Illness Readmission within the last 30 days:  No Current discharge  risk:  Physical Impairment, Chronically ill Barriers to Discharge:  Continued Medical Work up, Programmer, applications ([place])   Cranford Mon, LCSW 04/14/2015, 4:18 PM

## 2015-04-14 NOTE — Progress Notes (Signed)
Patient ID: Diane [name], female   DOB: February 14, 1935, 80 y.o.   MRN: [id number] Vascular Surgery Progress Note  Subjective: 2 days post removal of segment of suspected infected graft left upper arm with rerouting of graft peripheral to this.  Objective:  Filed Vitals:   04/14/15 0906  BP:   Pulse: 104  Temp: 97.6 F (36.4 C)  Resp: 17    Left upper arm graft site examined. Graft is functioning nicely and was utilized for hemodialysis yesterday without difficulty Surgical incisions all healing well with no evidence of residual infection on physical exam   Labs:  Recent Labs Lab 04/11/15 0708 04/12/15 0545 04/13/15 1517  CREATININE 5.03* 3.67* 5.34*    Recent Labs Lab 04/11/15 0708 04/12/15 0545 04/13/15 1517  NA 145 140 139  K 4.2 3.8 4.0  CL 113* 100* 103  CO2 22 27 24   BUN 43* 23* 40*  CREATININE 5.03* 3.67* 5.34*  GLUCOSE 97 96 143*  CALCIUM 9.4 8.7* 9.0    Recent Labs Lab 04/12/15 0545 04/13/15 1516 04/14/15 0607  WBC 8.3 7.5 7.8  HGB 10.5* 9.7* 9.6*  HCT 31.9* 29.2* 28.5*  PLT 137* 142* 126*    Recent Labs Lab 04/12/15 0545  INR 1.05    I/O last 3 completed shifts: In: 1080 [P.O.:1080] Out: 8185 [Urine:1550; Stool:1]  Imaging: No results found.  Assessment/Plan:  POD #2   LOS: 4 days  s/p Procedure(s): REVISION WITH INSERTION OF NEW 6 MM GORTEX GRAFT ,REMOVAL INFECTED SEGMENT  AV GORTEX GRAFT LEFT UPPER ARM  I will see patient in follow-up in 2 weeks in the office  [name], MD 04/14/2015 12:57 PM

## 2015-04-14 NOTE — Clinical Social Work Placement (Signed)
   CLINICAL SOCIAL WORK PLACEMENT  NOTE  Date:  04/14/2015  Patient Details  Name: Diane [name] MRN: [id number] Date of Birth: 01/22/1935  Clinical Social Work is seeking post-discharge placement for this patient at the [place] level of care (*CSW will initial, date and re-position this form in  chart as items are completed):  Yes   Patient/family provided with [place] Work Department's list of facilities offering this level of care within the geographic area requested by the patient (or if unable, by the patient's family).  Yes   Patient/family informed of their freedom to choose among providers that offer the needed level of care, that participate in Medicare, Medicaid or managed care program needed by the patient, have an available bed and are willing to accept the patient.  Yes   Patient/family informed of 's ownership interest in [facility] and [facility], as well as of the fact that they are under no obligation to receive care at these facilities.  PASRR submitted to EDS on 04/14/15     PASRR number received on       Existing PASRR number confirmed on       FL2 transmitted to all facilities in geographic area requested by pt/family on       FL2 transmitted to all facilities within larger geographic area on       Patient informed that his/her managed care company has contracts with or will negotiate with certain facilities, including the following:            Patient/family informed of bed offers received.  Patient chooses bed at       Physician recommends and patient chooses bed at      Patient to be transferred to   on  .  Patient to be transferred to facility by       Patient family notified on   of transfer.  Name of family member notified:        PHYSICIAN Please sign FL2     Additional Comment:    _______________________________________________ Cranford Mon, LCSW 04/14/2015, 4:26 PM

## 2015-04-14 NOTE — Progress Notes (Addendum)
[facility] PROGRESS NOTE  Diane [name] BSJ:[id number] DOB: [birth year] DOA: 04/10/2015 PCP: Donnajean Lopes, MD  Assessment/Plan:  Bacteremia- patient has 2 out of 2 positive blood cultures growing MSSA. ID consulted and recommended abx changed to cefazolin during HD till 8/2 for total of 6 weeks treatment. vancomycin d/ced. Likely source from the infected graft. Repeat blood culture obtained from 6/22 to ensure clearance. Repeat culture no growth so far.  Infected AV graft- s/p revision by Vascular surgery on 6/22. On abx.  CAD- patient has severe three-vessel disease, stable.  ESRD- patient currently on hemodialysis Tuesday Thursday and Saturday. Nephrology following  Constipation: stool softener.  FTT: c/o generalized weakness, physical therapy, SNF  Anxiety: need constant reassurance.   Addendum: Education officer, museum paged me to informed me that patient seems to be very anxious about her "blood infection" and indicated that she is tired of getting dialysis , patient expressed wishes to see palliative care. Palliative care paged.  Code Status: Full code Family Communication: No family at bedside Disposition Plan: SNF   Consultants:  Nephrology  Vascular surgery  Infectious disease  Procedures:  Revision of AVG on 6/22  Antibiotics:  vanc initially  Currently cefazolin through HD  HPI/Subjective: 80 y.o. female with history of end-stage renal disease on dialysis Tuesday Thursdays and Saturdays. States that she recently had some work done on her left axis site roughly a month ago. Since then she has noticed recently within the last several days some discharge from her site. Blood cultures were obtained. And recently resulted positive both of them as such patient was referred to the ER for further evaluation recommendations. Currently patient's only complaint is chills. The problem has been persistent since onset and getting worse. Nothing she is aware of makes  it better or worse  Patient c/o fatigue, right sided back pain early this morning, better with movement. Denies sob.  Objective: Filed Vitals:   04/14/15 0906  BP:   Pulse: 104  Temp: 97.6 F (36.4 C)  Resp: 17    Intake/Output Summary (Last 24 hours) at 04/14/15 1136 Last data filed at 04/14/15 0908  Gross per 24 hour  Intake    840 ml  Output    600 ml  Net    240 ml   Filed Weights   04/11/15 2042 04/13/15 1415 04/13/15 1907  Weight: 54.84 kg (120 lb 14.4 oz) 51.7 kg (113 lb 15.7 oz) 51.7 kg (113 lb 15.7 oz)    Exam:   General:  Appear in no acute distress. frail  Cardiovascular: S1S2 RRR  Respiratory: clear bilaterally  Abdomen: Soft, nontender, no organomegaly  Musculoskeletal: Left arm fistula s/p revision surgery on 6/22. Dressing intact, non tender.  Data Reviewed: Basic Metabolic Panel:  Recent Labs Lab 04/09/15 2211 04/10/15 1824 04/11/15 0708 04/12/15 0545 04/13/15 1517  NA 141 141 145 140 139  K 3.5 4.6 4.2 3.8 4.0  CL 100* 106 113* 100* 103  CO2 27 25 22 27 24   GLUCOSE 148* 161* 97 96 143*  BUN 29* 40* 43* 23* 40*  CREATININE 4.23* 4.81* 5.03* 3.67* 5.34*  CALCIUM 9.3 9.3 9.4 8.7* 9.0  PHOS  --   --   --   --  5.7*   Liver Function Tests:  Recent Labs Lab 04/09/15 2211 04/10/15 1824 04/13/15 1517  AST 81* 77*  --   ALT 85* 84*  --   ALKPHOS 75 75  --   BILITOT 0.2* 0.3  --   PROT 6.3*  6.3*  --   ALBUMIN 3.4* 3.5 2.9*   No results for input(s): LIPASE, AMYLASE in the last 168 hours. No results for input(s): AMMONIA in the last 168 hours. CBC:  Recent Labs Lab 04/09/15 2211 04/10/15 1824 04/11/15 0708 04/12/15 0545 04/13/15 1516 04/14/15 0607  WBC 9.6 9.1 10.1 8.3 7.5 7.8  NEUTROABS 5.5 5.7  --   --  4.5  --   HGB 11.2* 10.7* 11.8* 10.5* 9.7* 9.6*  HCT 33.6* 32.7* 36.1 31.9* 29.2* 28.5*  MCV 93.1 92.1 93.3 91.4 91.5 91.6  PLT 158 149* 161 137* 142* 126*    CBG:  Recent Labs Lab 04/11/15 0013  GLUCAP 126*     Recent Results (from the past 240 hour(s))  Blood Culture (routine x 2)     Status: None   Collection Time: 04/09/15 10:00 PM  Result Value Ref Range Status   Specimen Description BLOOD RIGHT ARM  Final   Special Requests BOTTLES DRAWN AEROBIC ONLY [place]  Final   Culture  Setup Time   Final    GRAM POSITIVE COCCI IN CLUSTERS AEROBIC BOTTLE ONLY CRITICAL RESULT CALLED TO, READ BACK BY AND VERIFIED WITH: L MILLER 04/10/15 @ [place]  Final   Report Status 04/12/2015 FINAL  Final   Organism ID, Bacteria STAPHYLOCOCCUS AUREUS  Final      Susceptibility   Staphylococcus aureus - MIC*    CIPROFLOXACIN <=0.5 SENSITIVE Sensitive     ERYTHROMYCIN >=8 RESISTANT Resistant     GENTAMICIN <=0.5 SENSITIVE Sensitive     OXACILLIN 0.5 SENSITIVE Sensitive     TETRACYCLINE <=1 SENSITIVE Sensitive     VANCOMYCIN 1 SENSITIVE Sensitive     TRIMETH/SULFA <=10 SENSITIVE Sensitive     CLINDAMYCIN <=0.25 SENSITIVE Sensitive     RIFAMPIN <=0.5 SENSITIVE Sensitive     Inducible Clindamycin NEGATIVE Sensitive     * STAPHYLOCOCCUS AUREUS  Blood Culture (routine x 2)     Status: None   Collection Time: 04/09/15 10:22 PM  Result Value Ref Range Status   Specimen Description BLOOD RIGHT ARM  Final   Special Requests BOTTLES DRAWN AEROBIC AND ANAEROBIC 5CC EACH  Final   Culture  Setup Time   Final    GRAM POSITIVE COCCI IN CLUSTERS IN BOTH AEROBIC AND ANAEROBIC BOTTLES CRITICAL RESULT CALLED TO, READ BACK BY AND VERIFIED WITH: L MILLER 04/10/15 @ 63 M [place]ST[place]    Culture   Final    STAPHYLOCOCCUS AUREUS SUSCEPTIBILITIES PERFORMED ON PREVIOUS CULTURE WITHIN THE LAST 5 DAYS.    Report Status 04/12/2015 FINAL  Final  MRSA PCR Screening     Status: None   Collection Time: 04/11/15  1:04 AM  Result Value Ref Range Status   MRSA by PCR NEGATIVE NEGATIVE Final    Comment:        The GeneXpert MRSA Assay (FDA approved for NASAL specimens only), is one component of  a comprehensive MRSA colonization surveillance program. It is not intended to diagnose MRSA infection nor to guide or monitor treatment for MRSA infections.   Anaerobic culture     Status: None (Preliminary result)   Collection Time: 04/12/15 10:26 AM  Result Value Ref Range Status   Specimen Description WOUND LEFT ARM  Final   Special Requests POF ON VANCOMYCIN AV GORTEXT LEFT UPPER ARM  Final   Gram Stain PENDING  Incomplete   Culture   Final    NO ANAEROBES ISOLATED; CULTURE IN  PROGRESS FOR 5 DAYS Performed at [facility]    Report Status PENDING  Incomplete  Wound culture     Status: None (Preliminary result)   Collection Time: 04/12/15 10:26 AM  Result Value Ref Range Status   Specimen Description WOUND LEFT ARM  Final   Special Requests POF ON VANCOMYCIN AV GORTEX GRAFT LEFT UPPER ARM  Final   Gram Stain   Final    RARE WBC PRESENT,BOTH PMN AND MONONUCLEAR NO SQUAMOUS EPITHELIAL CELLS SEEN NO ORGANISMS SEEN Performed at [facility]    Culture   Final    NO GROWTH 2 DAYS Performed at [facility]    Report Status PENDING  Incomplete     Studies: No results found.  Scheduled Meds: . aspirin  81 mg Oral Once per day on Sun Mon Wed Fri  . calcium acetate  667 mg Oral TID WC  .  ceFAZolin (ANCEF) IV  2 g Intravenous Q T,Th,Sa-HD  . [START ON 04/15/2015] darbepoetin (ARANESP) injection - DIALYSIS  60 mcg Intravenous Q Sat-HD  . gabapentin  200 mg Oral QHS  . heparin  5,000 Units Subcutaneous 3 times per day  . hydrOXYzine  50 mg Oral QHS  . midodrine  10 mg Oral BID WC  . multivitamin  1 tablet Oral QHS  . polyethylene glycol  17 g Oral Daily  . senna-docusate  2 tablet Oral BID  . sodium chloride  3 mL Intravenous Q12H   Continuous Infusions: . sodium chloride 20 mL/hr at 04/12/15 0940    Principal Problem:   Bacteremia Active Problems:   CAD (coronary artery disease): Severe three vessel   ESRD (end stage renal disease) on  dialysis    Time spent: 25 min    Richel Millspaugh MD PhD  [facility] Pager [phone]. If 7PM-7AM, please contact night-coverage at [email or web address], password [facility] 04/14/2015, 11:36 AM  LOS: 4 days

## 2015-04-14 NOTE — Progress Notes (Signed)
[name] Progress Note  Assessment/Plan: 1. Staph aureus bacteremia ( BC+ 04/09/15 [name]) MSSA = with L lower portion Avgg Infected/ Dr. Chaske Paskett Simmering revsed / on Ancef now Per ID last dose Aug 1, would cultures pending 2. ESRD - TTS HD  Use upper arm portion avg - HD Saturday if not d/c today- tight heparin 3. Hypertension/volume - bp 95/50 about baseline asymp / walking in room to Bathroom / no bp meds as op and no Midodrine- maybe need to add  / no change in no vol issues no wts since 6/21 documented Kept even Thursday with post weight 51.7- 4. Anemia - hgb drifting down to 9.7 Thursday and 9.6 today - resume Aranesp 60 on Saturday- hemocult ordered 5. Metabolic bone disease - 8.7 Ca / phoslo binder repeat P Sat - if still borderline - need to ^phoslo dose No vit d  6. Nutrition - alb 3.5 Renal diet and renal vit 7. HO Bipolar/Anxiety-  Has prn Tranxene  8. Thrombocytopenia - platelets drifting down - watch  [name], PA-C [name] [phone] 04/14/2015,9:27 AM  LOS: 4 days    Patient seen and examined, agree with above note with above modifications.  C/o back pain this AM but difficult to get story out of her- planning for HD tomorrow using upper portion of AVG- add midodrine  Diane Parish, MD 04/14/2015     Subjective:   Feels bad, can hardly open eyes (dry); back hurts , beds uncomfortable, got up for breakfast and went back to bed  Objective Filed Vitals:   04/13/15 1959 04/14/15 0454 04/14/15 0906 04/14/15 0910  BP: 99/44 95/45    Pulse: 94 85 104   Temp: 98.8 F (37.1 C) 97.5 F (36.4 C) 97.6 F (36.4 C)   TempSrc: Oral Axillary Oral   Resp: 16 16 17    Height:      Weight:      SpO2: 100% 99%  100%   Physical Exam General: NAD but uncomfortable, opens eyes easily Heart: RRR Lungs: no rales Back:  Tenderness on parspinal muscles - says massaging it feels good Abdomen: soft NT Extremities: no LE  edema Dialysis Access:  Left upper AVGG + bruit/distal dressing in tact  Dialysis Orders: [facility] TTS. EDW 53.5 HD Bath 2k/ 2.25 ca Time 3.5 hr Heparin 3000. Access LUA AVGG  Last Mircera 03/07/15 Units IV/HD Venofer 0  Other op labs = 04/06/15 hgb 11.4 Ca 9.4 phos 5.2 pth 149  Additional Objective Labs: Basic Metabolic Panel:  Recent Labs Lab 04/11/15 0708 04/12/15 0545 04/13/15 1517  NA 145 140 139  K 4.2 3.8 4.0  CL 113* 100* 103  CO2 22 27 24   GLUCOSE 97 96 143*  BUN 43* 23* 40*  CREATININE 5.03* 3.67* 5.34*  CALCIUM 9.4 8.7* 9.0  PHOS  --   --  5.7*   Liver Function Tests:  Recent Labs Lab 04/09/15 2211 04/10/15 1824 04/13/15 1517  AST 81* 77*  --   ALT 85* 84*  --   ALKPHOS 75 75  --   BILITOT 0.2* 0.3  --   PROT 6.3* 6.3*  --   ALBUMIN 3.4* 3.5 2.9*   CBC:  Recent Labs Lab 04/09/15 2211 04/10/15 1824 04/11/15 0708 04/12/15 0545 04/13/15 1516 04/14/15 0607  WBC 9.6 9.1 10.1 8.3 7.5 7.8  NEUTROABS 5.5 5.7  --   --  4.5  --   HGB 11.2* 10.7* 11.8* 10.5* 9.7* 9.6*  HCT  33.6* 32.7* 36.1 31.9* 29.2* 28.5*  MCV 93.1 92.1 93.3 91.4 91.5 91.6  PLT 158 149* 161 137* 142* 126*   Blood Culture    Component Value Date/Time   SDES WOUND LEFT ARM 04/12/2015 1026   SDES WOUND LEFT ARM 04/12/2015 1026   SPECREQUEST POF ON VANCOMYCIN AV GORTEXT LEFT UPPER ARM 04/12/2015 1026   SPECREQUEST POF ON VANCOMYCIN AV GORTEX GRAFT LEFT UPPER ARM 04/12/2015 1026   CULT  04/12/2015 1026    NO ANAEROBES ISOLATED; CULTURE IN PROGRESS FOR 5 DAYS Performed at [place]  04/12/2015 1026    NO GROWTH 2 DAYS Performed at [place] PENDING 04/12/2015 1026   REPTSTATUS PENDING 04/12/2015 1026   CBG:  Recent Labs Lab 04/11/15 0013  GLUCAP 126*  Medications: . sodium chloride 20 mL/hr at 04/12/15 0940   . aspirin  81 mg Oral Once per day on Sun Mon Wed Fri  . calcium acetate  667 mg Oral TID WC  .   ceFAZolin (ANCEF) IV  2 g Intravenous Q T,Th,Sa-HD  . gabapentin  200 mg Oral QHS  . heparin  5,000 Units Subcutaneous 3 times per day  . hydrOXYzine  50 mg Oral QHS  . multivitamin  1 tablet Oral QHS  . polyethylene glycol  17 g Oral Daily  . senna-docusate  2 tablet Oral BID  . sodium chloride  3 mL Intravenous Q12H

## 2015-04-15 DIAGNOSIS — R7881 Bacteremia: Secondary | ICD-10-CM

## 2015-04-15 LAB — COMPREHENSIVE METABOLIC PANEL
ALT: 22 U/L (ref 14–54)
AST: 114 U/L — ABNORMAL HIGH (ref 15–41)
Albumin: 3.3 g/dL — ABNORMAL LOW (ref 3.5–5.0)
Alkaline Phosphatase: 78 U/L (ref 38–126)
Anion gap: 12 (ref 5–15)
BUN: 6 mg/dL (ref 6–20)
CO2: 25 mmol/L (ref 22–32)
Calcium: 8.7 mg/dL — ABNORMAL LOW (ref 8.9–10.3)
Chloride: 99 mmol/L — ABNORMAL LOW (ref 101–111)
Creatinine, Ser: 1.86 mg/dL — ABNORMAL HIGH (ref 0.44–1.00)
GFR calc Af Amer: 29 mL/min — ABNORMAL LOW (ref >60)
GFR calc non Af Amer: 25 mL/min — ABNORMAL LOW (ref >60)
Glucose, Bld: 206 mg/dL — ABNORMAL HIGH (ref 65–99)
Potassium: 4 mmol/L (ref 3.5–5.1)
Sodium: 136 mmol/L (ref 135–145)
Total Bilirubin: 0.3 mg/dL (ref 0.3–1.2)
Total Protein: 6.2 g/dL — ABNORMAL LOW (ref 6.5–8.1)

## 2015-04-15 LAB — WOUND CULTURE: CULTURE: NO GROWTH

## 2015-04-15 LAB — CBC
HCT: 29.3 % — ABNORMAL LOW (ref 36.0–46.0)
HCT: 33.3 % — ABNORMAL LOW (ref 36.0–46.0)
Hemoglobin: 9.8 g/dL — ABNORMAL LOW (ref 12.0–15.0)
Hemoglobin: 11.2 g/dL — ABNORMAL LOW (ref 12.0–15.0)
MCH: 30 pg (ref 26.0–34.0)
MCH: 30.4 pg (ref 26.0–34.0)
MCHC: 33.4 g/dL (ref 30.0–36.0)
MCHC: 33.6 g/dL (ref 30.0–36.0)
MCV: 89.6 fL (ref 78.0–100.0)
MCV: 90.5 fL (ref 78.0–100.0)
Platelets: 160 10*3/uL (ref 150–400)
Platelets: 173 10*3/uL (ref 150–400)
RBC: 3.27 MIL/uL — ABNORMAL LOW (ref 3.87–5.11)
RBC: 3.68 MIL/uL — ABNORMAL LOW (ref 3.87–5.11)
RDW: 13.6 % (ref 11.5–15.5)
RDW: 13.7 % (ref 11.5–15.5)
WBC: 10.2 10*3/uL (ref 4.0–10.5)
WBC: 11.3 10*3/uL — ABNORMAL HIGH (ref 4.0–10.5)

## 2015-04-15 LAB — RENAL FUNCTION PANEL
Albumin: 2.9 g/dL — ABNORMAL LOW (ref 3.5–5.0)
Anion gap: 19 — ABNORMAL HIGH (ref 5–15)
BUN: 33 mg/dL — ABNORMAL HIGH (ref 6–20)
CO2: 24 mmol/L (ref 22–32)
Calcium: 9.4 mg/dL (ref 8.9–10.3)
Chloride: 94 mmol/L — ABNORMAL LOW (ref 101–111)
Creatinine, Ser: 4.91 mg/dL — ABNORMAL HIGH (ref 0.44–1.00)
GFR calc Af Amer: 9 mL/min — ABNORMAL LOW (ref >60)
GFR calc non Af Amer: 8 mL/min — ABNORMAL LOW (ref >60)
Glucose, Bld: 195 mg/dL — ABNORMAL HIGH (ref 65–99)
Phosphorus: 4.2 mg/dL (ref 2.5–4.6)
Potassium: 3.6 mmol/L (ref 3.5–5.1)
Sodium: 137 mmol/L (ref 135–145)

## 2015-04-15 LAB — PHOSPHORUS: Phosphorus: 1.8 mg/dL — ABNORMAL LOW (ref 2.5–4.6)

## 2015-04-15 MED ORDER — HEPARIN SODIUM (PORCINE) 1000 UNIT/ML DIALYSIS
20.0000 [IU]/kg | INTRAMUSCULAR | Status: DC | PRN
  Filled 2015-04-15: qty 1

## 2015-04-15 MED ORDER — PENTAFLUOROPROP-TETRAFLUOROETH EX AERO: 1.0000 "application " | INHALATION_SPRAY | CUTANEOUS | Status: DC | PRN

## 2015-04-15 MED ORDER — SODIUM CHLORIDE 0.9 % IV SOLN
100.0000 mL | INTRAVENOUS | Status: DC | PRN
Start: 2015-04-15 — End: 2015-04-15

## 2015-04-15 MED ORDER — HEPARIN SODIUM (PORCINE) 1000 UNIT/ML DIALYSIS
1000.0000 [IU] | INTRAMUSCULAR | Status: DC | PRN
  Filled 2015-04-15: qty 1

## 2015-04-15 MED ORDER — LIDOCAINE-PRILOCAINE 2.5-2.5 % EX CREA
1.0000 | TOPICAL_CREAM | CUTANEOUS | Status: DC | PRN
Start: 2015-04-15 — End: 2015-04-15

## 2015-04-15 MED ORDER — CALCIUM ACETATE (PHOS BINDER) 667 MG PO CAPS: 667.0000 mg | ORAL_CAPSULE | Freq: Three times a day (TID) | ORAL | Status: DC

## 2015-04-15 MED ORDER — DARBEPOETIN ALFA 60 MCG/0.3ML IJ SOSY
PREFILLED_SYRINGE | INTRAMUSCULAR | Status: AC
  Filled 2015-04-15: qty 0.3

## 2015-04-15 MED ORDER — OXYCODONE HCL 5 MG PO TABS: 5.0000 mg | ORAL_TABLET | Freq: Four times a day (QID) | ORAL | Status: DC | PRN

## 2015-04-15 MED ORDER — ALTEPLASE 2 MG IJ SOLR
2.0000 mg | Freq: Once | INTRAMUSCULAR | Status: DC | PRN
  Filled 2015-04-15: qty 2

## 2015-04-15 MED ORDER — CEFAZOLIN SODIUM-DEXTROSE 2-3 GM-% IV SOLR: 2.0000 g | INTRAVENOUS | Status: AC

## 2015-04-15 MED ORDER — DARBEPOETIN ALFA 60 MCG/0.3ML IJ SOSY: 60.0000 ug | PREFILLED_SYRINGE | INTRAMUSCULAR | Status: AC

## 2015-04-15 MED ORDER — NEPRO/CARBSTEADY PO LIQD
237.0000 mL | ORAL | Status: DC | PRN
Start: 2015-04-15 — End: 2015-04-15

## 2015-04-15 MED ORDER — ASPIRIN 81 MG PO CHEW: 81.0000 mg | CHEWABLE_TABLET | Freq: Every day | ORAL | Status: DC

## 2015-04-15 MED ORDER — SODIUM CHLORIDE 0.9 % IV SOLN: 100.0000 mL | INTRAVENOUS | Status: DC | PRN

## 2015-04-15 MED ORDER — LIDOCAINE HCL (PF) 1 % IJ SOLN: 5.0000 mL | INTRAMUSCULAR | Status: DC | PRN

## 2015-04-15 MED ORDER — MIDODRINE HCL 10 MG PO TABS: 10.0000 mg | ORAL_TABLET | Freq: Two times a day (BID) | ORAL | Status: AC

## 2015-04-15 NOTE — Progress Notes (Signed)
[facility] Progress Note  Assessment/Plan: 1. Staph aureus bacteremia ( BC+ 04/09/15 [name]) MSSA = with L lower portion Avgg Infected/ Dr. Lilibeth Opie Simmering revsed / on Ancef now Per ID last dose Aug 1, would cultures pending; revised graft looks good 2. ESRD - TTS HD  Use upper arm portion avg - HD today 3. Hypertension/volume - bp 95/50 about baseline asymp / walking in room to Bathroom / no bp meds as op now midodrine added- Kept even Thursday with post weight 51.7-pre HD weight 52 today - goal  1 L total 4. Anemia - hgb drifting down to 9.7 Thursday and 9.6 Friday - resume Aranesp 60 on Saturday- hemocult ordered 5. Metabolic bone disease - 8.7 Ca / phoslo binder repeat P Sat - if still borderline - need to ^phoslo dose No vit d  6. Nutrition - alb 3.5 Renal diet and renal vit- intake marginal 7. HO Bipolar/Anxiety-  Has prn Tranxene; tangential with complaints 8. Thrombocytopenia - platelets drifting down - watch 9. ^ LFTs - not clear why elevated - repeat today 10. Disp - SNF placement delayed due to need for [place], PA-C [place] [phone] 04/15/2015,8:48 AM  LOS: 5 days    Patient seen and examined, agree with above note with above modifications. Seen on HD- looks brighter- plan is for her to go to SNF but need PASSAR number first  [name], MD 04/15/2015     Subjective:   Feels better than yesterday. Back pain gone. Feels like she has to have a BM, but doesn't want to go now.  Objective Filed Vitals:   04/14/15 0910 04/14/15 1729 04/14/15 2041 04/15/15 0459  BP:  102/57 105/47 97/44  Pulse:  94 75 85  Temp:  98 F (36.7 C) 98.5 F (36.9 C) 98.8 F (37.1 C)  TempSrc:  Oral Oral Oral  Resp:  17 18 17   Height:      Weight:   54 kg (119 lb 0.8 oz)   SpO2: 100% 99% 98% 97%   Physical Exam General: NAD,  Heart: RRR Lungs: no rales Abdomen: soft NT + BS Extremities: no sig edema Dialysis  Access: left upper AVGG, revised area healing well, mild edema  Dialysis Orders: [facility] on TTS. EDW 53.5 HD Bath 2k/ 2.25 ca Time 3.5 hr Heparin 3000. Access LUA AVGG  Last Mircera 03/07/15 Units IV/HD Venofer 0  Other op labs = 04/06/15 hgb 11.4 Ca 9.4 phos 5.2 pth 149  Additional Objective Labs: Basic Metabolic Panel:  Recent Labs Lab 04/11/15 0708 04/12/15 0545 04/13/15 1517  NA 145 140 139  K 4.2 3.8 4.0  CL 113* 100* 103  CO2 22 27 24   GLUCOSE 97 96 143*  BUN 43* 23* 40*  CREATININE 5.03* 3.67* 5.34*  CALCIUM 9.4 8.7* 9.0  PHOS  --   --  5.7*   Liver Function Tests:  Recent Labs Lab 04/09/15 2211 04/10/15 1824 04/13/15 1517  AST 81* 77*  --   ALT 85* 84*  --   ALKPHOS 75 75  --   BILITOT 0.2* 0.3  --   PROT 6.3* 6.3*  --   ALBUMIN 3.4* 3.5 2.9*   CBC:  Recent Labs Lab 04/09/15 2211 04/10/15 1824 04/11/15 0708 04/12/15 0545 04/13/15 1516 04/14/15 0607  WBC 9.6 9.1 10.1 8.3 7.5 7.8  NEUTROABS 5.5 5.7  --   --  4.5  --   HGB 11.2* 10.7* 11.8* 10.5* 9.7* 9.6*  HCT 33.6* 32.7* 36.1 31.9* 29.2* 28.5*  MCV 93.1 92.1 93.3 91.4 91.5 91.6  PLT 158 149* 161 137* 142* 126*   Blood Culture    Component Value Date/Time   SDES BLOOD RIGHT HAND 04/13/2015 0546   SPECREQUEST BOTTLES DRAWN AEROBIC AND ANAEROBIC 4CC EA 04/13/2015 0546   CULT NO GROWTH 1 DAY 04/13/2015 0546   REPTSTATUS PENDING 04/13/2015 0546   CBG:  Recent Labs Lab 04/11/15 0013  GLUCAP 126*   IMedications: . sodium chloride 20 mL/hr at 04/12/15 0940   . aspirin  81 mg Oral Once per day on Sun Mon Wed Fri  . calcium acetate  667 mg Oral TID WC  .  ceFAZolin (ANCEF) IV  2 g Intravenous Q T,Th,Sa-HD  . darbepoetin (ARANESP) injection - DIALYSIS  60 mcg Intravenous Q Sat-HD  . gabapentin  200 mg Oral QHS  . heparin  5,000 Units Subcutaneous 3 times per day  . hydrOXYzine  50 mg Oral QHS  . midodrine  10 mg Oral BID WC  . multivitamin  1 tablet Oral QHS  . polyethylene  glycol  17 g Oral Daily  . senna-docusate  2 tablet Oral BID  . sodium chloride  3 mL Intravenous Q12H

## 2015-04-15 NOTE — Discharge Summary (Addendum)
Discharge Summary  Diane [name] KVQ:[id number] DOB: July 10, 1935  PCP: Donnajean Lopes, MD  Admit date: 04/10/2015 Discharge date: 04/15/2015  Time spent: >56mins  Recommendations for Outpatient Follow-up:  1. F/u with PMD with in a week for hospital discharge follow up, pmd to repeat lft, consider liver US if persistent elevated, pmd to f/u with acute hepatitis panel (drawn in the hospital). Avoid tylenol if lft elevated. 2. F/u with nephrology 3. F/u with cardiology for cad/hld s/p CABG.  4. F/u with vascular surgery, s/p  left arm AVF revision on 6/22.  Discharge Diagnoses:  Active Hospital Problems   Diagnosis Date Noted  . Bacteremia 04/10/2015  . ESRD (end stage renal disease) on dialysis 08/13/2012  . CAD (coronary artery disease): Severe three vessel 08/13/2012    Resolved Hospital Problems   Diagnosis Date Noted Date Resolved  No resolved problems to display.    Discharge Condition: stable  Diet recommendation: renal diet  Filed Weights   04/14/15 2041 04/15/15 0910 04/15/15 1244  Weight: 54 kg (119 lb 0.8 oz) 52 kg (114 lb 10.2 oz) 51.7 kg (113 lb 15.7 oz)    History of present illness:  Diane [name] is a 80 y.o. female  With history of end-stage renal disease on dialysis Tuesday Thursdays and Saturdays. States that she recently had some work done on her left axis site roughly a month ago. Since then she has noticed recently within the last several days some discharge from her site. Blood cultures were obtained. And recently resulted positive both of them as such patient was referred to the ER on 6/20 for further evaluation recommendations. Currently patient's only complaint is chills. The problem has been persistent since onset and getting worse. Nothing she is aware of makes it better or worse.   Hospital Course:  Principal Problem:   Bacteremia Active Problems:   CAD (coronary artery disease): Severe three vessel   ESRD (end stage renal  disease) on dialysis  Bacteremia-  patient has 2 out of 2 positive blood cultures growing MSSA. She was initially treated with vanc. ID consulted and recommended abx changed to cefazolin during HD till 8/2 for total of 6 weeks treatment. vancomycin d/ced. Likely source from the infected graft. Repeat blood culture obtained from 6/22 to ensure clearance. Repeat culture no growth so far.   Infected AV graft- s/p revision by Vascular surgery on 6/22. On abx as described above, outpatient follow up with vascular surgery.   ESRD-  hemodialysis Tuesday Thursday and Saturday. Nephrology following  CAD- s/p CABG in 2013,  stable. Only on asa, bp too low to tolerate betablocker, not on statin, patient reported lipitor make her leg cramp, outpatient cardiology follow up  Baseline low blood pressure, sbp in the 90's, midodrine started this admission by nephrology.  Elevated transaminase, normal tbili,  Acute hepatitis panel pending at time of discharge, pmd to f/u on final result and repeat lft,  Consider liver ultrasound if remain elevated. Avoid hepatotoxic drugs.  Constipation: stool softener.  FTT: c/o generalized weakness, physical therapy, SNF  Anxiety: need constant reassurance.   Addendum: Education officer, museum paged me to informed me that patient seems to be very anxious about her "blood infection" and indicated that she is tired of getting dialysis , patient expressed wishes to see palliative care. Palliative care paged on 6/25. On 6/26, patient reported she changed her mind, she will consider palliative care consult later if she needs it.  Code Status: Full code Family Communication: No family at  bedside Disposition Plan: SNF   Consultants:  Nephrology  Vascular surgery  Infectious disease  Procedures:  Revision of AVG on 6/22  HD  Antibiotics:  vanc initially   cefazolin through HD, total of 6 week till 8/2.  Discharge Exam: BP 90/42 mmHg  Pulse 74  Temp(Src) 99.1 F  (37.3 C) (Oral)  Resp 22  Ht 5\' 1"  (1.549 m)  Wt 51.7 kg (113 lb 15.7 oz)  BMI 21.55 kg/m2  SpO2 99%    General: Appear in no acute distress. frail  Cardiovascular: S1S2 RRR  Respiratory: clear bilaterally  Abdomen: Soft, nontender, no organomegaly  Musculoskeletal: Left arm fistula s/p revision surgery on 6/22. Dressing intact, non tender   Discharge Instructions You were cared for by a hospitalist during your hospital stay. If you have any questions about your discharge medications or the care you received while you were in the hospital after you are discharged, you can call the unit and asked to speak with the hospitalist on call if the hospitalist that took care of you is not available. Once you are discharged, your primary care physician will handle any further medical issues. Please note that NO REFILLS for any discharge medications will be authorized once you are discharged, as it is imperative that you return to your primary care physician (or establish a relationship with a primary care physician if you do not have one) for your aftercare needs so that they can reassess your need for medications and monitor your lab values.      Discharge Instructions    Diet renal [id number]    Complete by:  As directed      Increase activity slowly    Complete by:  As directed             Medication List    STOP taking these medications        acetaminophen 500 MG tablet  Commonly known as:  TYLENOL     doxycycline 100 MG capsule  Commonly known as:  VIBRAMYCIN     oxyCODONE-acetaminophen 5-325 MG per tablet  Commonly known as:  ROXICET      TAKE these medications        aspirin 81 MG tablet  Take 81 mg by mouth See admin instructions. Only take on mondays, wed, fridays, sundays     calcium acetate 667 MG capsule  Commonly known as:  PHOSLO  Take 667 mg by mouth 3 (three) times daily with meals.     calcium acetate 667 MG capsule  Commonly known as:  PHOSLO    Take 1 capsule (667 mg total) by mouth 3 (three) times daily with meals.     ceFAZolin 2-3 GM-% Solr  Commonly known as:  ANCEF  Inject 50 mLs (2 g total) into the vein Every Tuesday,Thursday,and Saturday with dialysis.     clorazepate 3.75 MG tablet  Commonly known as:  TRANXENE  Take 3.75 mg by mouth 2 (two) times daily as needed for anxiety.     Darbepoetin Alfa 60 MCG/0.3ML Sosy injection  Commonly known as:  ARANESP  Inject 0.3 mLs (60 mcg total) into the vein every Saturday with hemodialysis.     gabapentin 100 MG capsule  Commonly known as:  NEURONTIN  Take 200 mg by mouth at bedtime.     hydrOXYzine 50 MG tablet  Commonly known as:  ATARAX/VISTARIL  Take 50 mg by mouth at bedtime.     midodrine 10 MG tablet  Commonly known as:  PROAMATINE  Take 1 tablet (10 mg total) by mouth 2 (two) times daily with a meal.     multivitamin Tabs tablet  Take 1 tablet by mouth daily.     NITROSTAT 0.4 MG SL tablet  Generic drug:  nitroGLYCERIN  Place 0.4 mg under the tongue every 5 (five) minutes as needed.     oxyCODONE 5 MG immediate release tablet  Commonly known as:  Oxy IR/ROXICODONE  Take 1 tablet (5 mg total) by mouth every 6 (six) hours as needed for moderate pain.     polyethylene glycol packet  Commonly known as:  MIRALAX / GLYCOLAX  Take 17 g by mouth daily as needed. For constipation     Propylene Glycol 0.6 % Soln  Apply 1 drop to eye daily as needed (allergies).     REFRESH OP  Place 1 tablet into both eyes daily as needed. For dry eyes       Allergies  Allergen Reactions  . Ambien [Zolpidem Tartrate]     Stays awake  . Amoxicillin Diarrhea  . Clavulanic Acid   . Sulfa Antibiotics Other (See Comments)    Migraine   . Latex Rash  . Tape Rash    Can only use paper tape    Follow-up Information    Follow up with [name], MD In 2 weeks.   Specialties:  Vascular Surgery, Interventional Cardiology, Cardiology   Why:  Our office will call you to  arrange an appointment (sent)   Contact information:   [place] [address] [phone]       Follow up with Donnajean Lopes, MD In 1 week.   Specialty:  Internal Medicine   Why:  hospital discharge follow up   Contact information:   [place] [address] [phone]       Follow up with Windy Kalata, MD In 4 weeks.   Specialty:  Nephrology   Why:  esrd on HD   Contact information:   [place] [address] [phone]       Follow up with Shelva Majestic A, MD In 4 weeks.   Specialty:  Cardiology   Why:  CAD/HLD not tolerate lipitor   Contact information:   [address] [place]  [address] [phone]        The results of significant diagnostics from this hospitalization (including imaging, microbiology, ancillary and laboratory) are listed below for reference.    Significant Diagnostic Studies: Dg Chest 2 View  04/10/2015   CLINICAL DATA:  Positive blood cultures.  EXAM: CHEST  2 VIEW  COMPARISON:  12/13/2013  FINDINGS: Prior CABG. Heart is normal size. No confluent airspace opacities or effusions. No acute bony abnormality.  IMPRESSION: No active cardiopulmonary disease.   Electronically Signed   By: Rolm Baptise M.D.   On: 04/10/2015 21:52    Microbiology: Recent Results (from the past 240 hour(s))  Blood Culture (routine x 2)     Status: None   Collection Time: 04/09/15 10:00 PM  Result Value Ref Range Status   Specimen Description BLOOD RIGHT ARM  Final   Special Requests BOTTLES DRAWN AEROBIC ONLY [place]  Final   Culture  Setup Time   Final    GRAM POSITIVE COCCI IN CLUSTERS AEROBIC BOTTLE ONLY CRITICAL RESULT CALLED TO, READ BACK BY AND VERIFIED WITH: L MILLER 04/10/15 @ [place]  Final   Report Status 04/12/2015 FINAL  Final  Organism ID, Bacteria STAPHYLOCOCCUS AUREUS  Final      Susceptibility   Staphylococcus aureus - MIC*    CIPROFLOXACIN  <=0.5 SENSITIVE Sensitive     ERYTHROMYCIN >=8 RESISTANT Resistant     GENTAMICIN <=0.5 SENSITIVE Sensitive     OXACILLIN 0.5 SENSITIVE Sensitive     TETRACYCLINE <=1 SENSITIVE Sensitive     VANCOMYCIN 1 SENSITIVE Sensitive     TRIMETH/SULFA <=10 SENSITIVE Sensitive     CLINDAMYCIN <=0.25 SENSITIVE Sensitive     RIFAMPIN <=0.5 SENSITIVE Sensitive     Inducible Clindamycin NEGATIVE Sensitive     * STAPHYLOCOCCUS AUREUS  Blood Culture (routine x 2)     Status: None   Collection Time: 04/09/15 10:22 PM  Result Value Ref Range Status   Specimen Description BLOOD RIGHT ARM  Final   Special Requests BOTTLES DRAWN AEROBIC AND ANAEROBIC 5CC EACH  Final   Culture  Setup Time   Final    GRAM POSITIVE COCCI IN CLUSTERS IN BOTH AEROBIC AND ANAEROBIC BOTTLES CRITICAL RESULT CALLED TO, READ BACK BY AND VERIFIED WITH: L MILLER 04/10/15 @ 48 M [place]    Culture   Final    STAPHYLOCOCCUS AUREUS SUSCEPTIBILITIES PERFORMED ON PREVIOUS CULTURE WITHIN THE LAST 5 DAYS.    Report Status 04/12/2015 FINAL  Final  MRSA PCR Screening     Status: None   Collection Time: 04/11/15  1:04 AM  Result Value Ref Range Status   MRSA by PCR NEGATIVE NEGATIVE Final    Comment:        The GeneXpert MRSA Assay (FDA approved for NASAL specimens only), is one component of a comprehensive MRSA colonization surveillance program. It is not intended to diagnose MRSA infection nor to guide or monitor treatment for MRSA infections.   Anaerobic culture     Status: None (Preliminary result)   Collection Time: 04/12/15 10:26 AM  Result Value Ref Range Status   Specimen Description WOUND LEFT ARM  Final   Special Requests POF ON VANCOMYCIN AV GORTEXT LEFT UPPER ARM  Final   Gram Stain PENDING  Incomplete   Culture   Final    NO ANAEROBES ISOLATED; CULTURE IN PROGRESS FOR 5 DAYS Performed at [facility]    Report Status PENDING  Incomplete  Wound culture     Status: None (Preliminary result)   Collection  Time: 04/12/15 10:26 AM  Result Value Ref Range Status   Specimen Description WOUND LEFT ARM  Final   Special Requests POF ON VANCOMYCIN AV GORTEX GRAFT LEFT UPPER ARM  Final   Gram Stain   Final    RARE WBC PRESENT,BOTH PMN AND MONONUCLEAR NO SQUAMOUS EPITHELIAL CELLS SEEN NO ORGANISMS SEEN Performed at [facility]    Culture   Final    NO GROWTH 2 DAYS Performed at [facility]    Report Status PENDING  Incomplete  Culture, blood (routine x 2)     Status: None (Preliminary result)   Collection Time: 04/13/15  5:39 AM  Result Value Ref Range Status   Specimen Description BLOOD RIGHT ARM  Final   Special Requests BOTTLES DRAWN AEROBIC AND ANAEROBIC 5CC  Final   Culture NO GROWTH 2 DAYS  Final   Report Status PENDING  Incomplete  Culture, blood (routine x 2)     Status: None (Preliminary result)   Collection Time: 04/13/15  5:46 AM  Result Value Ref Range Status   Specimen Description BLOOD RIGHT HAND  Final   Special  Requests BOTTLES DRAWN AEROBIC AND ANAEROBIC 4CC EA  Final   Culture NO GROWTH 2 DAYS  Final   Report Status PENDING  Incomplete     Labs: Basic Metabolic Panel:  Recent Labs Lab 04/10/15 1824 04/11/15 0708 04/12/15 0545 04/13/15 1517 04/15/15 0818  NA 141 145 140 139 137  K 4.6 4.2 3.8 4.0 3.6  CL 106 113* 100* 103 94*  CO2 25 22 27 24 24   GLUCOSE 161* 97 96 143* 195*  BUN 40* 43* 23* 40* 33*  CREATININE 4.81* 5.03* 3.67* 5.34* 4.91*  CALCIUM 9.3 9.4 8.7* 9.0 9.4  PHOS  --   --   --  5.7* 4.2   Liver Function Tests:  Recent Labs Lab 04/09/15 2211 04/10/15 1824 04/13/15 1517 04/15/15 0818  AST 81* 77*  --   --   ALT 85* 84*  --   --   ALKPHOS 75 75  --   --   BILITOT 0.2* 0.3  --   --   PROT 6.3* 6.3*  --   --   ALBUMIN 3.4* 3.5 2.9* 2.9*   No results for input(s): LIPASE, AMYLASE in the last 168 hours. No results for input(s): AMMONIA in the last 168 hours. CBC:  Recent Labs Lab 04/09/15 2211 04/10/15 1824  04/11/15 0708 04/12/15 0545 04/13/15 1516 04/14/15 0607 04/15/15 0818  WBC 9.6 9.1 10.1 8.3 7.5 7.8 10.2  NEUTROABS 5.5 5.7  --   --  4.5  --   --   HGB 11.2* 10.7* 11.8* 10.5* 9.7* 9.6* 9.8*  HCT 33.6* 32.7* 36.1 31.9* 29.2* 28.5* 29.3*  MCV 93.1 92.1 93.3 91.4 91.5 91.6 89.6  PLT 158 149* 161 137* 142* 126* 160   Cardiac Enzymes: No results for input(s): CKTOTAL, CKMB, CKMBINDEX, TROPONINI in the last 168 hours. BNP: BNP (last 3 results) No results for input(s): BNP in the last 8760 hours.  ProBNP (last 3 results) No results for input(s): PROBNP in the last 8760 hours.  CBG:  Recent Labs Lab 04/11/15 0013  GLUCAP 126*       Signed:  Alma Muegge MD, PhD  [facility] 04/15/2015, 2:25 PM

## 2015-04-15 NOTE — Progress Notes (Signed)
ANTIBIOTIC CONSULT NOTE - FOLLOW UP  Pharmacy Consult for Anef Indication: Bacteremia  Allergies  Allergen Reactions  . Ambien [Zolpidem Tartrate]     Stays awake  . Amoxicillin Diarrhea  . Clavulanic Acid   . Sulfa Antibiotics Other (See Comments)    Migraine   . Latex Rash  . Tape Rash    Can only use paper tape     Patient Measurements: Height: 5\' 1"  (154.9 cm) Weight: 114 lb 10.2 oz (52 kg) IBW/kg (Calculated) : 47.8  Vital Signs: Temp: 97.7 F (36.5 C) (06/25 0910) Temp Source: Oral (06/25 0910) BP: 81/44 mmHg (06/25 1215) Pulse Rate: 86 (06/25 1215) Intake/Output from previous day: 06/24 0701 - 06/25 0700 In: 720 [P.O.:720] Out: 1000 [Urine:1000] Intake/Output from this shift: Total I/O In: 180 [P.O.:180] Out: 200 [Urine:200]  Labs:  Recent Labs  04/13/15 1516 04/13/15 1517 04/14/15 0607 04/15/15 0818  WBC 7.5  --  7.8 10.2  HGB 9.7*  --  9.6* 9.8*  PLT 142*  --  126* 160  CREATININE  --  5.34*  --  4.91*   Estimated Creatinine Clearance: 7.1 mL/min (by C-G formula based on Cr of 4.91). No results for input(s): VANCOTROUGH, VANCOPEAK, VANCORANDOM, GENTTROUGH, GENTPEAK, GENTRANDOM, TOBRATROUGH, TOBRAPEAK, TOBRARND, AMIKACINPEAK, AMIKACINTROU, AMIKACIN in the last 72 hours.   Microbiology: Recent Results (from the past 720 hour(s))  Blood Culture (routine x 2)     Status: None   Collection Time: 04/09/15 10:00 PM  Result Value Ref Range Status   Specimen Description BLOOD RIGHT ARM  Final   Special Requests BOTTLES DRAWN AEROBIC ONLY [place]  Final   Culture  Setup Time   Final    GRAM POSITIVE COCCI IN CLUSTERS AEROBIC BOTTLE ONLY CRITICAL RESULT CALLED TO, READ BACK BY AND VERIFIED WITH: L MILLER 04/10/15 @ [place]  Final   Report Status 04/12/2015 FINAL  Final   Organism ID, Bacteria STAPHYLOCOCCUS AUREUS  Final      Susceptibility   Staphylococcus aureus - MIC*    CIPROFLOXACIN <=0.5 SENSITIVE Sensitive      ERYTHROMYCIN >=8 RESISTANT Resistant     GENTAMICIN <=0.5 SENSITIVE Sensitive     OXACILLIN 0.5 SENSITIVE Sensitive     TETRACYCLINE <=1 SENSITIVE Sensitive     VANCOMYCIN 1 SENSITIVE Sensitive     TRIMETH/SULFA <=10 SENSITIVE Sensitive     CLINDAMYCIN <=0.25 SENSITIVE Sensitive     RIFAMPIN <=0.5 SENSITIVE Sensitive     Inducible Clindamycin NEGATIVE Sensitive     * STAPHYLOCOCCUS AUREUS  Blood Culture (routine x 2)     Status: None   Collection Time: 04/09/15 10:22 PM  Result Value Ref Range Status   Specimen Description BLOOD RIGHT ARM  Final   Special Requests BOTTLES DRAWN AEROBIC AND ANAEROBIC 5CC EACH  Final   Culture  Setup Time   Final    GRAM POSITIVE COCCI IN CLUSTERS IN BOTH AEROBIC AND ANAEROBIC BOTTLES CRITICAL RESULT CALLED TO, READ BACK BY AND VERIFIED WITH: L MILLER 04/10/15 @ 74 M [facility]ST[facility]    Culture   Final    STAPHYLOCOCCUS AUREUS SUSCEPTIBILITIES PERFORMED ON PREVIOUS CULTURE WITHIN THE LAST 5 DAYS.    Report Status 04/12/2015 FINAL  Final  MRSA PCR Screening     Status: None   Collection Time: 04/11/15  1:04 AM  Result Value Ref Range Status   MRSA by PCR NEGATIVE NEGATIVE Final    Comment:  The GeneXpert MRSA Assay (FDA approved for NASAL specimens only), is one component of a comprehensive MRSA colonization surveillance program. It is not intended to diagnose MRSA infection nor to guide or monitor treatment for MRSA infections.   Anaerobic culture     Status: None (Preliminary result)   Collection Time: 04/12/15 10:26 AM  Result Value Ref Range Status   Specimen Description WOUND LEFT ARM  Final   Special Requests POF ON VANCOMYCIN AV GORTEXT LEFT UPPER ARM  Final   Gram Stain PENDING  Incomplete   Culture   Final    NO ANAEROBES ISOLATED; CULTURE IN PROGRESS FOR 5 DAYS Performed at [facility]    Report Status PENDING  Incomplete  Wound culture     Status: None (Preliminary result)   Collection Time: 04/12/15 10:26 AM   Result Value Ref Range Status   Specimen Description WOUND LEFT ARM  Final   Special Requests POF ON VANCOMYCIN AV GORTEX GRAFT LEFT UPPER ARM  Final   Gram Stain   Final    RARE WBC PRESENT,BOTH PMN AND MONONUCLEAR NO SQUAMOUS EPITHELIAL CELLS SEEN NO ORGANISMS SEEN Performed at [facility]    Culture   Final    NO GROWTH 2 DAYS Performed at [facility]    Report Status PENDING  Incomplete  Culture, blood (routine x 2)     Status: None (Preliminary result)   Collection Time: 04/13/15  5:39 AM  Result Value Ref Range Status   Specimen Description BLOOD RIGHT ARM  Final   Special Requests BOTTLES DRAWN AEROBIC AND ANAEROBIC 5CC  Final   Culture NO GROWTH 1 DAY  Final   Report Status PENDING  Incomplete  Culture, blood (routine x 2)     Status: None (Preliminary result)   Collection Time: 04/13/15  5:46 AM  Result Value Ref Range Status   Specimen Description BLOOD RIGHT HAND  Final   Special Requests BOTTLES DRAWN AEROBIC AND ANAEROBIC 4CC EA  Final   Culture NO GROWTH 1 DAY  Final   Report Status PENDING  Incomplete    Anti-infectives    Start     Dose/Rate Route Frequency Ordered Stop   04/13/15 1200  ceFAZolin (ANCEF) IVPB 2 g/50 mL premix     2 g 100 mL/hr over 30 Minutes Intravenous Every T-Th-Sa (Hemodialysis) 04/12/15 1453     04/12/15 1600  ceFAZolin (ANCEF) IVPB 2 g/50 mL premix     2 g 100 mL/hr over 30 Minutes Intravenous  Once 04/12/15 1453 04/12/15 1808   04/11/15 1200  vancomycin (VANCOCIN) 500 mg in sodium chloride 0.9 % 100 mL IVPB  Status:  Discontinued     500 mg 100 mL/hr over 60 Minutes Intravenous Every T-Th-Sa (Hemodialysis) 04/10/15 2138 04/12/15 1404   04/10/15 2200  vancomycin (VANCOCIN) IVPB 1000 mg/200 mL premix     1,000 mg 200 mL/hr over 60 Minutes Intravenous  Once 04/10/15 2138 04/10/15 2327      Assessment: Diane [name] with ESRD on HD TTS. Patient was seen in the [facility] 6/19 for an AV graft infection. Blood cultures were drawn  and she was sent home on oral doxycycline. Blood cultures grew MSSA. MSSA bacteremia with AVF infection with revision today. ID consulted and ordered to continue with cefazolin with dialysis for 6 weeks through August 2nd. ID repeated blood cultures today to assure clearance, discontinued IV vancomycin.  Goal of Therapy:  Resolution of infection  Plan:  Continue Ancef 2 gm IV QHD (  qTTS)  Follow HD tolerance/schedule  Mirka Barbone J 04/15/2015,1:27 PM

## 2015-04-15 NOTE — Progress Notes (Signed)
04/15/2015  3:22 PM  Evanie Marjory Sneddon to be D/C'd Skilled nursing facility per MD order.  Discussed prescriptions and follow up appointments with the patient. Prescriptions given to patient, medication list explained in detail. Pt verbalized understanding.    Medication List    STOP taking these medications        acetaminophen 500 MG tablet  Commonly known as:  TYLENOL     doxycycline 100 MG capsule  Commonly known as:  VIBRAMYCIN     oxyCODONE-acetaminophen 5-325 MG per tablet  Commonly known as:  ROXICET      TAKE these medications        aspirin 81 MG tablet  Take 81 mg by mouth See admin instructions. Only take on mondays, wed, fridays, sundays     calcium acetate 667 MG capsule  Commonly known as:  PHOSLO  Take 667 mg by mouth 3 (three) times daily with meals.     calcium acetate 667 MG capsule  Commonly known as:  PHOSLO  Take 1 capsule (667 mg total) by mouth 3 (three) times daily with meals.     ceFAZolin 2-3 GM-% Solr  Commonly known as:  ANCEF  Inject 50 mLs (2 g total) into the vein Every Tuesday,Thursday,and Saturday with dialysis.     clorazepate 3.75 MG tablet  Commonly known as:  TRANXENE  Take 3.75 mg by mouth 2 (two) times daily as needed for anxiety.     Darbepoetin Alfa 60 MCG/0.3ML Sosy injection  Commonly known as:  ARANESP  Inject 0.3 mLs (60 mcg total) into the vein every Saturday with hemodialysis.     gabapentin 100 MG capsule  Commonly known as:  NEURONTIN  Take 200 mg by mouth at bedtime.     hydrOXYzine 50 MG tablet  Commonly known as:  ATARAX/VISTARIL  Take 50 mg by mouth at bedtime.     midodrine 10 MG tablet  Commonly known as:  PROAMATINE  Take 1 tablet (10 mg total) by mouth 2 (two) times daily with a meal.     multivitamin Tabs tablet  Take 1 tablet by mouth daily.     NITROSTAT 0.4 MG SL tablet  Generic drug:  nitroGLYCERIN  Place 0.4 mg under the tongue every 5 (five) minutes as needed.     oxyCODONE 5 MG immediate  release tablet  Commonly known as:  Oxy IR/ROXICODONE  Take 1 tablet (5 mg total) by mouth every 6 (six) hours as needed for moderate pain.     polyethylene glycol packet  Commonly known as:  MIRALAX / GLYCOLAX  Take 17 g by mouth daily as needed. For constipation     Propylene Glycol 0.6 % Soln  Apply 1 drop to eye daily as needed (allergies).     REFRESH OP  Place 1 tablet into both eyes daily as needed. For dry eyes        Filed Vitals:   04/15/15 1244  BP: 90/42  Pulse: 74  Temp: 99.1 F (37.3 C)  Resp: 22    Skin clean, dry and intact without evidence of skin break down, no evidence of skin tears noted. IV catheter discontinued intact. Site without signs and symptoms of complications. Dressing and pressure applied. Pt denies pain at this time. No complaints noted.  An After Visit Summary was printed and given to the patient. Patient escorted via [place], and D/C to [facility] via private auto.  [employer], RN-BC, [employer] [facility] Phone [phone]

## 2015-04-15 NOTE — Consult Note (Signed)
Consult received for potentially continuing dialysis.  Patient feeling better today and wants to continue dialysis per discussions with primary team.  Plan is to be discharged today. Please contact our team if further questions or concerns arise.  We would be happy to see her in future.    Doran Clay D.O. Palliative Medicine Team at [facility]  Pager: [phone] Team Phone: [phone]

## 2015-04-15 NOTE — Clinical Social Work Placement (Signed)
   CLINICAL SOCIAL WORK PLACEMENT  NOTE  Date:  04/15/2015  Patient Details  Name: Diane [name] MRN: [id number] Date of Birth: 1934-11-03  Clinical Social Work is seeking post-discharge placement for this patient at the [place] level of care (*CSW will initial, date and re-position this form in  chart as items are completed):  Yes   Patient/family provided with [facility] Work Department's list of facilities offering this level of care within the geographic area requested by the patient (or if unable, by the patient's family).  Yes   Patient/family informed of their freedom to choose among providers that offer the needed level of care, that participate in Medicare, Medicaid or managed care program needed by the patient, have an available bed and are willing to accept the patient.  Yes   Patient/family informed of [facility]'s ownership interest in [facility] and [facility], as well as of the fact that they are under no obligation to receive care at these facilities.  PASRR submitted to EDS on 04/14/15     PASRR number received on       Existing PASRR number confirmed on       FL2 transmitted to all facilities in geographic area requested by pt/family on       FL2 transmitted to all facilities within larger geographic area on       Patient informed that his/her managed care company has contracts with or will negotiate with certain facilities, including the following:        Yes   Patient/family informed of bed offers received.  Patient chooses bed at [facility]     Physician recommends and patient chooses bed at      Patient to be transferred to [facility] on 04/15/15.  Patient to be transferred to facility by Car with daughter     Patient family notified on 04/15/15 of transfer.  Name of family member notified:  Daughter:  Derenda Fennel     PHYSICIAN Please sign FL2, Please prepare  priority discharge summary, including medications     Additional Comment:  Ok for d/c today to SNF at [facility]. Patient verbalized concern that she felt somewhat chilled after her dialysis treatment today. Her room was noted to be very warm with the heat on high. Patient and daughter wanted to make sure she did not have a temperature or any blood infection. CSW notified RN to talk with patient and daughter about their concerns. Daughter requested to transport patient via car.  Bed is available at facility per [facility].  PASARR number in place from [facility] must.  Nursing notified to call report. CSW signing off.  Lorie Phenix. Murrell Redden [phone]      _______________________________________________ Williemae Area, LCSW 04/15/2015, 9:38 PM

## 2015-04-15 NOTE — Procedures (Signed)
Patient was seen on dialysis and the procedure was supervised.  BFR 400  Via AVF BP is  97/44.   Patient appears to be tolerating treatment well  Diane [name] A 04/15/2015

## 2015-04-17 ENCOUNTER — Telehealth: Payer: Self-pay | Admitting: Vascular Surgery

## 2015-04-17 LAB — ANAEROBIC CULTURE

## 2015-04-17 NOTE — Telephone Encounter (Addendum)
-----   Message from Mena Goes, RN sent at 04/14/2015  3:16 PM EDT ----- Regarding: Schedule   ----- Message -----    From: [place], PA-C    Sent: 04/14/2015   2:34 PM      To: [facility]vs Charge Pool  S/p 2 days post removal of segment of suspected infected graft left upper arm with rerouting of graft peripheral to this 04/12/15  F/u with Dr. Kellie Simmering in 2 weeks.  Please cancel appointment on 04/21/15  Thanks Kim  04/17/15: phone # is constantly busy- mailed letter with new appt date/time, dpm

## 2015-04-18 ENCOUNTER — Encounter: Payer: Self-pay | Admitting: Internal Medicine

## 2015-04-18 ENCOUNTER — Non-Acute Institutional Stay (SKILLED_NURSING_FACILITY): Payer: Medicare Other | Admitting: Internal Medicine

## 2015-04-18 DIAGNOSIS — I2510 Atherosclerotic heart disease of native coronary artery without angina pectoris: Secondary | ICD-10-CM | POA: Diagnosis not present

## 2015-04-18 DIAGNOSIS — R7881 Bacteremia: Secondary | ICD-10-CM | POA: Diagnosis not present

## 2015-04-18 DIAGNOSIS — Z992 Dependence on renal dialysis: Secondary | ICD-10-CM

## 2015-04-18 DIAGNOSIS — R740 Nonspecific elevation of levels of transaminase and lactic acid dehydrogenase [LDH]: Secondary | ICD-10-CM

## 2015-04-18 DIAGNOSIS — I951 Orthostatic hypotension: Secondary | ICD-10-CM

## 2015-04-18 DIAGNOSIS — T827XXD Infection and inflammatory reaction due to other cardiac and vascular devices, implants and grafts, subsequent encounter: Secondary | ICD-10-CM | POA: Diagnosis not present

## 2015-04-18 DIAGNOSIS — R627 Adult failure to thrive: Secondary | ICD-10-CM

## 2015-04-18 DIAGNOSIS — N186 End stage renal disease: Secondary | ICD-10-CM

## 2015-04-18 DIAGNOSIS — R7401 Elevation of levels of liver transaminase levels: Secondary | ICD-10-CM

## 2015-04-18 LAB — CULTURE, BLOOD (ROUTINE X 2)
Culture: NO GROWTH
Culture: NO GROWTH

## 2015-04-18 NOTE — Progress Notes (Signed)
MRN: [id number] Name: Diane [name]  Sex: female Age: 80 y.o. DOB: 1934/10/25  [facility] Facility/Room:106 Level Of Care: SNF Provider: Inocencio Homes D Emergency Contacts: Extended Emergency Contact Information Primary Emergency Contact: Brouillard,Donna Address: [address]          [place], [place] [address] Johnnette Litter of [place] Phone: [phone] Mobile Phone: [phone] Relation: Daughter Secondary Emergency Contact: Kinnie Scales Jr.,Robert L.  [place] of [place] Phone: [phone] Mobile Phone: [phone] Relation: Son  Code Status: DNR  Allergies: Ambien; Amoxicillin; Clavulanic acid; Sulfa antibiotics; Latex; and Tape  Chief Complaint  Patient presents with  . New Admit To SNF    HPI: Patient is 80 y.o. female who has ESRD on dialysis who is admitted to SNF for generalized weakness after being hospitalized for bacteremia from an infected AV graft.  Past Medical History  Diagnosis Date  . Anemia in chronic kidney disease(285.21)   . Bipolar disorder, unspecified   . Iron deficiency anemia, unspecified   . Secondary hyperparathyroidism (of renal origin)   . Restless legs syndrome (RLS)   . Unspecified hypertensive kidney disease with chronic kidney disease stage V or end stage renal disease   . 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 Personal history of colonic polyps   . Diverticulosis of colon (without mention of hemorrhage) 2006    Colonoscopy Dr. Oletta Lamas  . Coronary artery disease   . Anginal pain   . S/P CABG x 5, 08/15/12, LIMA-LAD;VG-diag;VG-OM; seq VG-PDA,PLA  08/18/2012  . Anxiety   . Depression     Past Surgical History  Procedure Laterality Date  . Arteriovenous graft placement  11/2010    left upper - AVGG Dr. Donnetta Hutching  . Arteriovenous graft placement  12/2009    left lower - AVGG Dr. Donnetta Hutching  . Av fistula placement  07/2009    right upper AVF  Dr. Donnetta Hutching  . Av fistula placement  01/2002    right lower Dr. Donnetta Hutching  . Cardiac catheterization  08/12/2012  . Abdominal hysterectomy    . Coronary artery bypass graft  08/14/2012    Procedure: CORONARY ARTERY BYPASS GRAFTING (CABG);  Surgeon: Gaye Pollack, MD;  Location: [facility];  Service: Open Heart Surgery;  Laterality: N/A;  times five, using left internal mammary  . Endovein harvest of greater saphenous vein  08/14/2012    Procedure: ENDOVEIN HARVEST OF GREATER SAPHENOUS VEIN;  Surgeon: Gaye Pollack, MD;  Location: [facility];  Service: Open Heart Surgery;  Laterality: Bilateral;  . Eye surgery    . Left and right heart catheterization with coronary/graft angiogram  08/12/2012    Procedure: LEFT AND RIGHT HEART CATHETERIZATION WITH Beatrix Fetters;  Surgeon: Troy Sine, MD;  Location: [facility] CATH LAB;  Service: Cardiovascular;;  . Revision of arteriovenous goretex graft Left 02/27/2015    Procedure: EXCISION OF ULCER AND REVISION OF LEFT UPPER ARM ARTERIOVENOUS GORETEX GRAFT;  Surgeon: Rosetta Posner, MD;  Location: [facility];  Service: Vascular;  Laterality: Left;  . Revision of arteriovenous goretex graft Left 04/12/2015    Procedure: REVISION WITH INSERTION OF NEW 6 MM GORTEX GRAFT ,REMOVAL INFECTED SEGMENT  AV GORTEX GRAFT LEFT UPPER ARM;  Surgeon: Mal Misty, MD;  Location: [facility];  Service: Vascular;  Laterality: Left;      Medication List       This list is accurate as of: 04/18/15 11:59  PM.  Always use your most recent med list.               aspirin 81 MG tablet  Take 81 mg by mouth See admin instructions. Only take on mondays, wed, fridays, sundays     calcium acetate 667 MG capsule  Commonly known as:  PHOSLO  Take 667 mg by mouth 3 (three) times daily with meals.     calcium acetate 667 MG capsule  Commonly known as:  PHOSLO  Take 1 capsule (667 mg total) by mouth 3 (three) times daily with meals.     ceFAZolin 2-3 GM-% Solr  Commonly known as:  ANCEF  Inject  50 mLs (2 g total) into the vein Every Tuesday,Thursday,and Saturday with dialysis.     clorazepate 3.75 MG tablet  Commonly known as:  TRANXENE  Take 3.75 mg by mouth 2 (two) times daily as needed for anxiety.     Darbepoetin Alfa 60 MCG/0.3ML Sosy injection  Commonly known as:  ARANESP  Inject 0.3 mLs (60 mcg total) into the vein every Saturday with hemodialysis.     gabapentin 100 MG capsule  Commonly known as:  NEURONTIN  Take 200 mg by mouth at bedtime.     hydrOXYzine 50 MG tablet  Commonly known as:  ATARAX/VISTARIL  Take 50 mg by mouth at bedtime.     midodrine 10 MG tablet  Commonly known as:  PROAMATINE  Take 1 tablet (10 mg total) by mouth 2 (two) times daily with a meal.     multivitamin Tabs tablet  Take 1 tablet by mouth daily.     NITROSTAT 0.4 MG SL tablet  Generic drug:  nitroGLYCERIN  Place 0.4 mg under the tongue every 5 (five) minutes as needed.     oxyCODONE 5 MG immediate release tablet  Commonly known as:  Oxy IR/ROXICODONE  Take 1 tablet (5 mg total) by mouth every 6 (six) hours as needed for moderate pain.     polyethylene glycol packet  Commonly known as:  MIRALAX / GLYCOLAX  Take 17 g by mouth daily as needed. For constipation     Propylene Glycol 0.6 % Soln  Apply 1 drop to eye daily as needed (allergies).     REFRESH OP  Place 1 tablet into both eyes daily as needed. For dry eyes        No orders of the defined types were placed in this encounter.     There is no immunization history on file for this patient.  History  Substance Use Topics  . Smoking status: Never Smoker   . Smokeless tobacco: Never Used  . Alcohol Use: No    Family history is noncontributory    Review of Systems  DATA OBTAINED: from patient, nurse; pt refused dialysis today which is reported not unusual for her GENERAL:  no fevers, fatigue, appetite changes SKIN: No itching, rash or wounds EYES: No eye pain, redness, discharge EARS: No earache, tinnitus,  change in hearing NOSE: No congestion, drainage or bleeding  MOUTH/THROAT: No mouth or tooth pain, No sore throat RESPIRATORY: No cough, wheezing, SOB CARDIAC: No chest pain, palpitations, lower extremity edema  GI: No abdominal pain, No N/V/D or constipation, No heartburn or reflux  GU: No dysuria, frequency or urgency, or incontinence  MUSCULOSKELETAL: No unrelieved bone/joint pain NEUROLOGIC: No headache, dizziness or focal weakness PSYCHIATRIC: No overt anxiety or sadness, No behavior issue.   Filed Vitals:   04/18/15 1625  BP: 130/70  Pulse: 60  Temp: 98.2 F (36.8 C)  Resp: 18    Physical Exam  GENERAL APPEARANCE: Alert, conversant,  No acute distress.  SKIN: AV graft L arm with I and D site appears without infection and more proximal a graft with a thrill HEAD: Normocephalic, atraumatic  EYES: Conjunctiva/lids clear. Pupils round, reactive. EOMs intact.  EARS: External exam WNL, canals clear. Hearing grossly normal.  NOSE: No deformity or discharge.  MOUTH/THROAT: Lips w/o lesions  RESPIRATORY: Breathing is even, unlabored. Lung sounds are clear   CARDIOVASCULAR: Heart RRR no murmurs, rubs or gallops. No peripheral edema.   GASTROINTESTINAL: Abdomen is soft, non-tender, not distended w/ normal bowel sounds. GENITOURINARY: Bladder non tender, not distended  MUSCULOSKELETAL: No abnormal joints or musculature NEUROLOGIC:  Cranial nerves 2-12 grossly intact PSYCHIATRIC: Mood and affect appropriate to situation, no behavioral issues  Patient Active Problem List   Diagnosis Date Noted  . Infection of AV graft for dialysis 04/19/2015  . Orthostatic hypotension 04/19/2015  . Elevated transaminase level 04/19/2015  . FTT (failure to thrive) in adult 04/19/2015  . Bacteremia 04/10/2015  . Physical deconditioning 12/13/2013  . Sepsis 12/12/2013  . Influenza A 12/12/2013  . Peripheral neuropathy 08/11/2013  . Cardiovascular stress test abnormal and chest pain with  tachycardia 08/20/2012  . Anemia,chronic and acute post op 08/20/2012  . S/P CABG x 5, 08/15/12, LIMA-LAD;VG-diag;VG-OM; seq VG-PDA,PLA  08/18/2012  . CAD (coronary artery disease): Severe three vessel 08/13/2012  . ESRD (end stage renal disease) on dialysis 08/13/2012  . Knee pain, acute, right 08/13/2012    CBC    Component Value Date/Time   WBC 11.3* 04/15/2015 1450   RBC 3.68* 04/15/2015 1450   HGB 11.2* 04/15/2015 1450   HCT 33.3* 04/15/2015 1450   PLT 173 04/15/2015 1450   MCV 90.5 04/15/2015 1450   LYMPHSABS 2.0 04/13/2015 1516   MONOABS 0.7 04/13/2015 1516   EOSABS 0.4 04/13/2015 1516   BASOSABS 0.0 04/13/2015 1516    CMP     Component Value Date/Time   NA 136 04/15/2015 1450   K 4.0 04/15/2015 1450   CL 99* 04/15/2015 1450   CO2 25 04/15/2015 1450   GLUCOSE 206* 04/15/2015 1450   BUN 6 04/15/2015 1450   CREATININE 1.86* 04/15/2015 1450   CREATININE 4.92* 08/23/2013 0952   CALCIUM 8.7* 04/15/2015 1450   PROT 6.2* 04/15/2015 1450   ALBUMIN 3.3* 04/15/2015 1450   AST 114* 04/15/2015 1450   ALT 22 04/15/2015 1450   ALKPHOS 78 04/15/2015 1450   BILITOT 0.3 04/15/2015 1450   GFRNONAA 25* 04/15/2015 1450   GFRAA 29* 04/15/2015 1450    Assessment and Plan  Bacteremia patient has 2 out of 2 positive blood cultures growing MSSA. She was initially treated with vanc. ID consulted and recommended abx changed to cefazolin during HD till 8/2 for total of 6 weeks treatment. vancomycin d/ced. Likely source from the infected graft. Repeat blood culture obtained from 6/22 to ensure clearance. Repeat culture no growth so far.   Infection of AV graft for dialysis s/p revision by Vascular surgery on 6/22. On abx as described above, outpatient follow up with vascular surgery.   ESRD (end stage renal disease) on dialysis - hemodialysis Tuesday Thursday and Saturday. Nephrology following  CAD (coronary artery disease): Severe three vessel s/p CABG in 2013, stable. Only  on asa, bp too low to tolerate betablocker, not on statin, patient reported lipitor make her leg cramp, outpatient cardiology follow up   Orthostatic  hypotension sbp in the 90's, midodrine started this admission by nephrology  Elevated transaminase level normal tbili, Acute hepatitis panel pending at time of discharge, pmd to f/u on final result and repeat lft, Consider liver ultrasound if remain elevated. Avoid hepatotoxic drugs.  FTT (failure to thrive) in adult : c/o generalized weakness, physical therapy, SNF    Hennie Duos, MD

## 2015-04-19 DIAGNOSIS — R627 Adult failure to thrive: Secondary | ICD-10-CM | POA: Insufficient documentation

## 2015-04-19 DIAGNOSIS — R7401 Elevation of levels of liver transaminase levels: Secondary | ICD-10-CM | POA: Insufficient documentation

## 2015-04-19 DIAGNOSIS — R740 Nonspecific elevation of levels of transaminase and lactic acid dehydrogenase [LDH]: Secondary | ICD-10-CM

## 2015-04-19 DIAGNOSIS — T827XXA Infection and inflammatory reaction due to other cardiac and vascular devices, implants and grafts, initial encounter: Secondary | ICD-10-CM | POA: Insufficient documentation

## 2015-04-19 DIAGNOSIS — I951 Orthostatic hypotension: Secondary | ICD-10-CM | POA: Insufficient documentation

## 2015-04-19 NOTE — Assessment & Plan Note (Signed)
patient has 2 out of 2 positive blood cultures growing MSSA. She was initially treated with vanc. ID consulted and recommended abx changed to cefazolin during HD till 8/2 for total of 6 weeks treatment. vancomycin d/ced. Likely source from the infected graft. Repeat blood culture obtained from 6/22 to ensure clearance. Repeat culture no growth so far.

## 2015-04-19 NOTE — Assessment & Plan Note (Signed)
normal tbili, Acute hepatitis panel pending at time of discharge, pmd to f/u on final result and repeat lft, Consider liver ultrasound if remain elevated. Avoid hepatotoxic drugs.

## 2015-04-19 NOTE — Assessment & Plan Note (Signed)
s/p CABG in 2013, stable. Only on asa, bp too low to tolerate betablocker, not on statin, patient reported lipitor make her leg cramp, outpatient cardiology follow up

## 2015-04-19 NOTE — Assessment & Plan Note (Signed)
sbp in the 90's, midodrine started this admission by nephrology

## 2015-04-19 NOTE — Assessment & Plan Note (Signed)
-   hemodialysis Tuesday Thursday and Saturday. Nephrology following

## 2015-04-19 NOTE — Assessment & Plan Note (Signed)
s/p revision by Vascular surgery on 6/22. On abx as described above, outpatient follow up with vascular surgery.

## 2015-04-19 NOTE — Assessment & Plan Note (Signed)
:   c/o generalized weakness, physical therapy, SNF

## 2015-04-21 ENCOUNTER — Non-Acute Institutional Stay (SKILLED_NURSING_FACILITY): Payer: Medicare Other | Admitting: Internal Medicine

## 2015-04-21 ENCOUNTER — Encounter: Payer: Medicare Other | Admitting: Vascular Surgery

## 2015-04-21 ENCOUNTER — Encounter: Payer: Self-pay | Admitting: Internal Medicine

## 2015-04-21 DIAGNOSIS — Z992 Dependence on renal dialysis: Secondary | ICD-10-CM

## 2015-04-21 DIAGNOSIS — I2510 Atherosclerotic heart disease of native coronary artery without angina pectoris: Secondary | ICD-10-CM

## 2015-04-21 DIAGNOSIS — I951 Orthostatic hypotension: Secondary | ICD-10-CM | POA: Diagnosis not present

## 2015-04-21 DIAGNOSIS — T827XXD Infection and inflammatory reaction due to other cardiac and vascular devices, implants and grafts, subsequent encounter: Secondary | ICD-10-CM

## 2015-04-21 DIAGNOSIS — N186 End stage renal disease: Secondary | ICD-10-CM | POA: Diagnosis not present

## 2015-04-21 DIAGNOSIS — R7881 Bacteremia: Secondary | ICD-10-CM

## 2015-04-21 DIAGNOSIS — R627 Adult failure to thrive: Secondary | ICD-10-CM | POA: Diagnosis not present

## 2015-04-21 DIAGNOSIS — R740 Nonspecific elevation of levels of transaminase and lactic acid dehydrogenase [LDH]: Secondary | ICD-10-CM

## 2015-04-21 DIAGNOSIS — R7401 Elevation of levels of liver transaminase levels: Secondary | ICD-10-CM

## 2015-04-21 NOTE — Progress Notes (Signed)
MRN: [id number] Name: Diane [name]  Sex: female Age: 80 y.o. DOB: Feb 20, 1935  [facility] Facility/Room:106 Level Of Care: SNF Provider: Inocencio Homes D Emergency Contacts: Extended Emergency Contact Information Primary Emergency Contact: Brouillard,Donna Address: [address]          [place], [place] [address] Johnnette Litter of [place] Phone: [phone] Mobile Phone: [phone] Relation: Daughter Secondary Emergency Contact: Kinnie Scales Jr.,Robert L.  [place] of [place] Phone: [phone] Mobile Phone: [phone] Relation: Son  Code Status:   Allergies: Ambien; Amoxicillin; Clavulanic acid; Sulfa antibiotics; Latex; and Tape  Chief Complaint  Patient presents with  . Discharge Note    HPI: Patient is 80 y.o. female who has ESRD on dialysis, and who was admitted to SNF for generalized weakness after bacteremia 2/2 infected AV fistula who is now ready to be d/c to home.  Past Medical History  Diagnosis Date  . Anemia in chronic kidney disease(285.21)   . Bipolar disorder, unspecified   . Iron deficiency anemia, unspecified   . Secondary hyperparathyroidism (of renal origin)   . Restless legs syndrome (RLS)   . Unspecified hypertensive kidney disease with chronic kidney disease stage V or end stage renal disease   . 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 Personal history of colonic polyps   . Diverticulosis of colon (without mention of hemorrhage) 2006    Colonoscopy Dr. Oletta Lamas  . Coronary artery disease   . Anginal pain   . S/P CABG x 5, 08/15/12, LIMA-LAD;VG-diag;VG-OM; seq VG-PDA,PLA  08/18/2012  . Anxiety   . Depression     Past Surgical History  Procedure Laterality Date  . Arteriovenous graft placement  11/2010    left upper - AVGG Dr. Donnetta Hutching  . Arteriovenous graft placement  12/2009    left lower - AVGG Dr. Donnetta Hutching  . Av fistula placement  07/2009    right  upper AVF Dr. Donnetta Hutching  . Av fistula placement  01/2002    right lower Dr. Donnetta Hutching  . Cardiac catheterization  08/12/2012  . Abdominal hysterectomy    . Coronary artery bypass graft  08/14/2012    Procedure: CORONARY ARTERY BYPASS GRAFTING (CABG);  Surgeon: Gaye Pollack, MD;  Location: [facility];  Service: Open Heart Surgery;  Laterality: N/A;  times five, using left internal mammary  . Endovein harvest of greater saphenous vein  08/14/2012    Procedure: ENDOVEIN HARVEST OF GREATER SAPHENOUS VEIN;  Surgeon: Gaye Pollack, MD;  Location: [facility];  Service: Open Heart Surgery;  Laterality: Bilateral;  . Eye surgery    . Left and right heart catheterization with coronary/graft angiogram  08/12/2012    Procedure: LEFT AND RIGHT HEART CATHETERIZATION WITH Beatrix Fetters;  Surgeon: Troy Sine, MD;  Location: [facility] CATH LAB;  Service: Cardiovascular;;  . Revision of arteriovenous goretex graft Left 02/27/2015    Procedure: EXCISION OF ULCER AND REVISION OF LEFT UPPER ARM ARTERIOVENOUS GORETEX GRAFT;  Surgeon: Rosetta Posner, MD;  Location: [facility];  Service: Vascular;  Laterality: Left;  . Revision of arteriovenous goretex graft Left 04/12/2015    Procedure: REVISION WITH INSERTION OF NEW 6 MM GORTEX GRAFT ,REMOVAL INFECTED SEGMENT  AV GORTEX GRAFT LEFT UPPER ARM;  Surgeon: Mal Misty, MD;  Location: [facility];  Service: Vascular;  Laterality: Left;      Medication List       This list is accurate  as of: 04/21/15  3:56 PM.  Always use your most recent med list.               aspirin 81 MG tablet  Take 81 mg by mouth See admin instructions. Only take on mondays, wed, fridays, sundays     calcium acetate 667 MG capsule  Commonly known as:  PHOSLO  Take 667 mg by mouth 3 (three) times daily with meals.     ceFAZolin 2-3 GM-% Solr  Commonly known as:  ANCEF  Inject 50 mLs (2 g total) into the vein Every Tuesday,Thursday,and Saturday with dialysis.     clorazepate 3.75 MG tablet  Commonly known  as:  TRANXENE  Take 3.75 mg by mouth 2 (two) times daily.     Darbepoetin Alfa 60 MCG/0.3ML Sosy injection  Commonly known as:  ARANESP  Inject 0.3 mLs (60 mcg total) into the vein every Saturday with hemodialysis.     gabapentin 100 MG capsule  Commonly known as:  NEURONTIN  Take 200 mg by mouth at bedtime.     hydrOXYzine 50 MG tablet  Commonly known as:  ATARAX/VISTARIL  Take 50 mg by mouth at bedtime.     midodrine 10 MG tablet  Commonly known as:  PROAMATINE  Take 1 tablet (10 mg total) by mouth 2 (two) times daily with a meal.     NITROSTAT 0.4 MG SL tablet  Generic drug:  nitroGLYCERIN  Place 0.4 mg under the tongue every 5 (five) minutes as needed.     polyethylene glycol packet  Commonly known as:  MIRALAX / GLYCOLAX  Take 17 g by mouth daily as needed. For constipation     Propylene Glycol 0.6 % Soln  Apply 1 drop to eye daily as needed (allergies).     REFRESH OP  Place 1 tablet into both eyes daily as needed. For dry eyes     traMADol 50 MG tablet  Commonly known as:  ULTRAM  Take 50 mg by mouth every 6 (six) hours as needed.        Meds ordered this encounter  Medications  . traMADol (ULTRAM) 50 MG tablet    Sig: Take 50 mg by mouth every 6 (six) hours as needed.     There is no immunization history on file for this patient.  History  Substance Use Topics  . Smoking status: Never Smoker   . Smokeless tobacco: Never Used  . Alcohol Use: No    Filed Vitals:   04/21/15 1409  BP: 107/49  Pulse: 65  Temp: 97.1 F (36.2 C)  Resp: 16    Physical Exam  GENERAL APPEARANCE: Alert, conversant. No acute distress.  HEENT: Unremarkable. RESPIRATORY: Breathing is even, unlabored. Lung sounds are clear   CARDIOVASCULAR: Heart RRR no murmurs, rubs or gallops. No peripheral edema.  GASTROINTESTINAL: Abdomen is soft, non-tender, not distended w/ normal bowel sounds.  NEUROLOGIC: Cranial nerves 2-12 grossly intact. Moves all extremities  Patient  Active Problem List   Diagnosis Date Noted  . Infection of AV graft for dialysis 04/19/2015  . Orthostatic hypotension 04/19/2015  . Elevated transaminase level 04/19/2015  . FTT (failure to thrive) in adult 04/19/2015  . Bacteremia 04/10/2015  . Physical deconditioning 12/13/2013  . Sepsis 12/12/2013  . Influenza A 12/12/2013  . Peripheral neuropathy 08/11/2013  . Cardiovascular stress test abnormal and chest pain with tachycardia 08/20/2012  . Anemia,chronic and acute post op 08/20/2012  . S/P CABG x 5, 08/15/12, LIMA-LAD;VG-diag;VG-OM; seq VG-PDA,PLA  08/18/2012  . CAD (coronary artery disease): Severe three vessel 08/13/2012  . ESRD (end stage renal disease) on dialysis 08/13/2012  . Knee pain, acute, right 08/13/2012    CBC    Component Value Date/Time   WBC 11.3* 04/15/2015 1450   RBC 3.68* 04/15/2015 1450   HGB 11.2* 04/15/2015 1450   HCT 33.3* 04/15/2015 1450   PLT 173 04/15/2015 1450   MCV 90.5 04/15/2015 1450   LYMPHSABS 2.0 04/13/2015 1516   MONOABS 0.7 04/13/2015 1516   EOSABS 0.4 04/13/2015 1516   BASOSABS 0.0 04/13/2015 1516    CMP     Component Value Date/Time   NA 136 04/15/2015 1450   K 4.0 04/15/2015 1450   CL 99* 04/15/2015 1450   CO2 25 04/15/2015 1450   GLUCOSE 206* 04/15/2015 1450   BUN 6 04/15/2015 1450   CREATININE 1.86* 04/15/2015 1450   CREATININE 4.92* 08/23/2013 0952   CALCIUM 8.7* 04/15/2015 1450   PROT 6.2* 04/15/2015 1450   ALBUMIN 3.3* 04/15/2015 1450   AST 114* 04/15/2015 1450   ALT 22 04/15/2015 1450   ALKPHOS 78 04/15/2015 1450   BILITOT 0.3 04/15/2015 1450   GFRNONAA 25* 04/15/2015 1450   GFRAA 29* 04/15/2015 1450    Assessment and Plan  Pt has had uneventful stay at SNF. The only change has been to stop her oxycodone at her request for something not as strong like ultram. Pt is d/c to home with HH/OT/PT/nursing.  Hennie Duos, MD

## 2015-04-28 ENCOUNTER — Encounter: Payer: Self-pay | Admitting: Vascular Surgery

## 2015-05-02 ENCOUNTER — Encounter: Payer: Self-pay | Admitting: Vascular Surgery

## 2015-05-02 ENCOUNTER — Ambulatory Visit (INDEPENDENT_AMBULATORY_CARE_PROVIDER_SITE_OTHER): Payer: Self-pay | Admitting: Vascular Surgery

## 2015-05-02 VITALS — BP 118/57 | HR 87 | Resp 14 | Ht 61.0 in | Wt 117.0 lb

## 2015-05-02 DIAGNOSIS — N186 End stage renal disease: Secondary | ICD-10-CM

## 2015-05-02 NOTE — Progress Notes (Signed)
Subjective:     Patient ID: Diane [name], female   DOB: 1935/06/04, 80 y.o.   MRN: [id number]  HPI this 80 year old female returns for follow-up regarding her surgery on June 20 to remove a possibly infected and exposed segment of AV Gore-Tex graft in the left upper arm. She also had a rerouting procedure with new graft around this. She has had no chills fever or drainage from this area. She is being dialyzed through the most proximal portion of the graft where there was no infection. She dialyzes Tuesday Thursday and Saturday.   Review of Systems     Objective:   Physical Exam BP 118/57 mmHg  Pulse 87  Resp 14  Ht 5\' 1"  (1.549 m)  Wt 117 lb (53.071 kg)  BMI 22.12 kg/m2  General well-developed elderly female no apparent distress alert and oriented 3 Left upper extremity graft has excellent pulse and palpable thrill Surgical sites all nicely healed. Nylon sutures removed from midportion where the infected segment was removed and there is no evidence of recurrent infection     Assessment:     Nicely healing wounds and left upper arm graft where revision was performed and exposed segment removed    Plan:     Okay to use new segment of graft beginning Tuesday, July 26.-Would not use very midportion of the graft where incision is located since graft to graft anastomosis was performed at this point Return to see me on when necessary basis

## 2015-05-11 ENCOUNTER — Encounter (HOSPITAL_COMMUNITY): Payer: Self-pay | Admitting: *Deleted

## 2015-05-11 ENCOUNTER — Other Ambulatory Visit: Payer: Self-pay

## 2015-05-12 ENCOUNTER — Ambulatory Visit (HOSPITAL_COMMUNITY)
Admission: RE | Admit: 2015-05-12 | Discharge: 2015-05-12 | Disposition: A | Discharge status: Home / Self Care | Payer: Medicare Other | Source: Ambulatory Visit | Attending: Vascular Surgery | Admitting: Vascular Surgery

## 2015-05-12 ENCOUNTER — Ambulatory Visit (HOSPITAL_COMMUNITY): Payer: Medicare Other | Admitting: Anesthesiology

## 2015-05-12 ENCOUNTER — Encounter (HOSPITAL_COMMUNITY)
Admission: RE | Disposition: A | Discharge status: Home / Self Care | Payer: Self-pay | Source: Ambulatory Visit | Attending: Vascular Surgery

## 2015-05-12 ENCOUNTER — Encounter (HOSPITAL_COMMUNITY): Payer: Self-pay | Admitting: Anesthesiology

## 2015-05-12 DIAGNOSIS — N186 End stage renal disease: Secondary | ICD-10-CM | POA: Diagnosis not present

## 2015-05-12 DIAGNOSIS — T82868A Thrombosis of vascular prosthetic devices, implants and grafts, initial encounter: Secondary | ICD-10-CM

## 2015-05-12 DIAGNOSIS — R7989 Other specified abnormal findings of blood chemistry: Secondary | ICD-10-CM | POA: Insufficient documentation

## 2015-05-12 DIAGNOSIS — Y832 Surgical operation with anastomosis, bypass or graft as the cause of abnormal reaction of the patient, or of later complication, without mention of misadventure at the time of the procedure: Secondary | ICD-10-CM | POA: Insufficient documentation

## 2015-05-12 DIAGNOSIS — Z992 Dependence on renal dialysis: Secondary | ICD-10-CM | POA: Insufficient documentation

## 2015-05-12 DIAGNOSIS — I2510 Atherosclerotic heart disease of native coronary artery without angina pectoris: Secondary | ICD-10-CM | POA: Insufficient documentation

## 2015-05-12 DIAGNOSIS — Z951 Presence of aortocoronary bypass graft: Secondary | ICD-10-CM | POA: Insufficient documentation

## 2015-05-12 HISTORY — DX: Malignant (primary) neoplasm, unspecified: C80.1

## 2015-05-12 HISTORY — DX: Unspecified osteoarthritis, unspecified site: M19.90

## 2015-05-12 HISTORY — PX: THROMBECTOMY W/ EMBOLECTOMY: SHX2507

## 2015-05-12 HISTORY — DX: Calculus of kidney: N20.0

## 2015-05-12 LAB — POCT I-STAT 4, (NA,K, GLUC, HGB,HCT)
Glucose, Bld: 124 mg/dL — ABNORMAL HIGH (ref 65–99)
HCT: 35 % — ABNORMAL LOW (ref 36.0–46.0)
HEMOGLOBIN: 11.9 g/dL — AB (ref 12.0–15.0)
POTASSIUM: 4.4 mmol/L (ref 3.5–5.1)
Sodium: 143 mmol/L (ref 135–145)

## 2015-05-12 SURGERY — THROMBECTOMY ARTERIOVENOUS GORE-TEX GRAFT
Anesthesia: General | Site: Arm Upper | Laterality: Left

## 2015-05-12 MED ORDER — SODIUM CHLORIDE 0.9 % IV SOLN
INTRAVENOUS | Status: DC
  Administered 2015-05-12: 10 mL/h via INTRAVENOUS

## 2015-05-12 MED ORDER — PROMETHAZINE HCL 25 MG/ML IJ SOLN: 6.2500 mg | INTRAMUSCULAR | Status: DC | PRN

## 2015-05-12 MED ORDER — MIDAZOLAM HCL 2 MG/2ML IJ SOLN
INTRAMUSCULAR | Status: AC
  Filled 2015-05-12: qty 2

## 2015-05-12 MED ORDER — VANCOMYCIN HCL IN DEXTROSE 1-5 GM/200ML-% IV SOLN
1000.0000 mg | INTRAVENOUS | Status: AC
  Administered 2015-05-12: 1000 mg via INTRAVENOUS

## 2015-05-12 MED ORDER — SODIUM CHLORIDE 0.9 % IR SOLN
Status: DC | PRN
  Administered 2015-05-12: 500 mL

## 2015-05-12 MED ORDER — VANCOMYCIN HCL IN DEXTROSE 1-5 GM/200ML-% IV SOLN
INTRAVENOUS | Status: DC
Start: 2015-05-12 — End: 2015-05-12
  Filled 2015-05-12: qty 200

## 2015-05-12 MED ORDER — ARTIFICIAL TEARS OP OINT
TOPICAL_OINTMENT | OPHTHALMIC | Status: AC
  Filled 2015-05-12: qty 3.5

## 2015-05-12 MED ORDER — MIDAZOLAM HCL 5 MG/5ML IJ SOLN
INTRAMUSCULAR | Status: DC | PRN
  Administered 2015-05-12: 0.5 mg via INTRAVENOUS

## 2015-05-12 MED ORDER — LIDOCAINE HCL (CARDIAC) 20 MG/ML IV SOLN
INTRAVENOUS | Status: AC
  Filled 2015-05-12: qty 5

## 2015-05-12 MED ORDER — LIDOCAINE-EPINEPHRINE (PF) 1 %-1:200000 IJ SOLN
INTRAMUSCULAR | Status: AC
  Filled 2015-05-12: qty 10

## 2015-05-12 MED ORDER — OXYCODONE HCL 5 MG PO TABS: 5.0000 mg | ORAL_TABLET | Freq: Four times a day (QID) | ORAL | Status: DC | PRN

## 2015-05-12 MED ORDER — FENTANYL CITRATE (PF) 250 MCG/5ML IJ SOLN
INTRAMUSCULAR | Status: AC
  Filled 2015-05-12: qty 5

## 2015-05-12 MED ORDER — CHLORHEXIDINE GLUCONATE CLOTH 2 % EX PADS: 6.0000 | MEDICATED_PAD | Freq: Once | CUTANEOUS | Status: DC

## 2015-05-12 MED ORDER — ONDANSETRON HCL 4 MG/2ML IJ SOLN
INTRAMUSCULAR | Status: AC
  Filled 2015-05-12: qty 2

## 2015-05-12 MED ORDER — FENTANYL CITRATE (PF) 100 MCG/2ML IJ SOLN
INTRAMUSCULAR | Status: DC | PRN
Start: 2015-05-12 — End: 2015-05-12
  Administered 2015-05-12 (×2): 25 ug via INTRAVENOUS

## 2015-05-12 MED ORDER — FENTANYL CITRATE (PF) 100 MCG/2ML IJ SOLN: 25.0000 ug | INTRAMUSCULAR | Status: DC | PRN

## 2015-05-12 MED ORDER — DEXTROSE 5 % IV SOLN
10.0000 mg | INTRAVENOUS | Status: DC | PRN
  Administered 2015-05-12: 40 ug/min via INTRAVENOUS

## 2015-05-12 MED ORDER — SODIUM CHLORIDE 0.9 % IR SOLN
Status: DC | PRN
  Administered 2015-05-12: 1000 mL

## 2015-05-12 MED ORDER — PROPOFOL 10 MG/ML IV BOLUS
INTRAVENOUS | Status: DC | PRN
  Administered 2015-05-12: 20 mg via INTRAVENOUS
  Administered 2015-05-12: 120 mg via INTRAVENOUS

## 2015-05-12 MED ORDER — LIDOCAINE HCL (CARDIAC) 20 MG/ML IV SOLN
INTRAVENOUS | Status: DC | PRN
  Administered 2015-05-12: 60 mg via INTRAVENOUS

## 2015-05-12 SURGICAL SUPPLY — 31 items
ARMBAND PINK RESTRICT EXTREMIT (MISCELLANEOUS) ×3 IMPLANT
CANISTER SUCTION 2500CC (MISCELLANEOUS) ×3 IMPLANT
CATH EMB 5FR 80CM (CATHETERS) ×1 IMPLANT
CATH EMB LATEX FREE 5FRX80CM (CATHETERS) ×3
CATH EMB LF 5FRX80 (CATHETERS) IMPLANT
CLIP TI MEDIUM 6 (CLIP) ×3 IMPLANT
CLIP TI WIDE RED SMALL 6 (CLIP) ×3 IMPLANT
ELECT REM PT RETURN 9FT ADLT (ELECTROSURGICAL) ×3
ELECTRODE REM PT RTRN 9FT ADLT (ELECTROSURGICAL) ×1 IMPLANT
EMBOLECTOMY CATHETER ×2 IMPLANT
GEL ULTRASOUND 20GR AQUASONIC (MISCELLANEOUS) IMPLANT
GLOVE BIOGEL PI IND STRL 6.5 (GLOVE) IMPLANT
GLOVE BIOGEL PI INDICATOR 6.5 (GLOVE) ×4
GLOVE SS BIOGEL STRL SZ 7 (GLOVE) ×1 IMPLANT
GLOVE SUPERSENSE BIOGEL SZ 7 (GLOVE)
GLOVE SURG SS PI 6.5 STRL IVOR (GLOVE) ×8 IMPLANT
GLOVE SURG SS PI 7.0 STRL IVOR (GLOVE) ×6 IMPLANT
GOWN STRL REUS W/ TWL LRG LVL3 (GOWN DISPOSABLE) ×3 IMPLANT
GOWN STRL REUS W/TWL LRG LVL3 (GOWN DISPOSABLE) ×9
KIT BASIN OR (CUSTOM PROCEDURE TRAY) ×3 IMPLANT
KIT ROOM TURNOVER OR (KITS) ×3 IMPLANT
LIQUID BAND (GAUZE/BANDAGES/DRESSINGS) ×3 IMPLANT
NS IRRIG 1000ML POUR BTL (IV SOLUTION) ×3 IMPLANT
PACK CV ACCESS (CUSTOM PROCEDURE TRAY) ×3 IMPLANT
PAD ARMBOARD 7.5X6 YLW CONV (MISCELLANEOUS) ×6 IMPLANT
SUT PROLENE 6 0 BV (SUTURE) ×3 IMPLANT
SUT PROLENE 6 0 C 1 24 (SUTURE) ×2 IMPLANT
SUT VIC AB 3-0 SH 27 (SUTURE) ×3
SUT VIC AB 3-0 SH 27X BRD (SUTURE) ×1 IMPLANT
UNDERPAD 30X30 INCONTINENT (UNDERPADS AND DIAPERS) ×3 IMPLANT
WATER STERILE IRR 1000ML POUR (IV SOLUTION) ×3 IMPLANT

## 2015-05-12 NOTE — Anesthesia Procedure Notes (Signed)
Procedure Name: LMA Insertion Date/Time: 05/12/2015 12:57 PM Performed by: Terrill Mohr Pre-anesthesia Checklist: Emergency Drugs available, Patient identified, Suction available and Patient being monitored Patient Re-evaluated:Patient Re-evaluated prior to inductionOxygen Delivery Method: Circle system utilized Preoxygenation: Pre-oxygenation with 100% oxygen Intubation Type: IV induction Ventilation: Mask ventilation without difficulty LMA: LMA inserted LMA Size: 4.0 Number of attempts: 1 Placement Confirmation: breath sounds checked- equal and bilateral and positive ETCO2 Tube secured with: Tape (taped across cheeks) Dental Injury: Teeth and Oropharynx as per pre-operative assessment

## 2015-05-12 NOTE — Discharge Instructions (Signed)
° ° °  05/12/2015 Diane [name] [id number] 1934/11/03  Surgeon(s): Mal Misty, MD  Procedure(s): THROMBECTOMY ARTERIOVENOUS GORE-TEX GRAFT WITHREVISION VENOUS END LEFT UPPER ARM  x May stick graft immediately

## 2015-05-12 NOTE — Anesthesia Preprocedure Evaluation (Addendum)
Anesthesia Evaluation  Patient identified by MRN, date of birth, ID band Patient awake    Reviewed: Allergy & Precautions, NPO status , Patient's Chart, lab work & pertinent test results  History of Anesthesia Complications Negative for: history of anesthetic complications  Airway Mallampati: I  TM Distance: >3 FB Neck ROM: Full    Dental  (+) Edentulous Upper, Edentulous Lower   Pulmonary neg pulmonary ROS,  breath sounds clear to auscultation        Cardiovascular - angina+ CAD and + CABG (2013) Rhythm:Regular Rate:Normal  04/11/15/ ECHO: EF 60-65%, valves OK   Neuro/Psych negative neurological ROS     GI/Hepatic negative GI ROS, Elevated LFTs   Endo/Other    Renal/GU CRF, ESRF and DialysisRenal disease (K+ 4.4)Tues-Th-Sat since 2013     Musculoskeletal   Abdominal   Peds  Hematology  (+) Blood dyscrasia (Hb 11.9), ,   Anesthesia Other Findings Dentures out to labelled cup to son  Reproductive/Obstetrics                            Anesthesia Physical  Anesthesia Plan  ASA: III  Anesthesia Plan: General   Post-op Pain Management:    Induction: Intravenous  Airway Management Planned: LMA  Additional Equipment:   Intra-op Plan:   Post-operative Plan:   Informed Consent: I have reviewed the patients History and Physical, chart, labs and discussed the procedure including the risks, benefits and alternatives for the proposed anesthesia with the patient or authorized representative who has indicated his/her understanding and acceptance.   Dental advisory given  Plan Discussed with: CRNA and Surgeon  Anesthesia Plan Comments:         Anesthesia Quick Evaluation

## 2015-05-12 NOTE — H&P (View-Only) (Signed)
Patient ID: Diane [name], female   DOB: [birth year], 80 y.o.   MRN: [id number] Vascular Surgery Progress Note  Subjective: 2 days post removal of segment of suspected infected graft left upper arm with rerouting of graft peripheral to this.  Objective:  Filed Vitals:   04/14/15 0906  BP:   Pulse: 104  Temp: 97.6 F (36.4 C)  Resp: 17    Left upper arm graft site examined. Graft is functioning nicely and was utilized for hemodialysis yesterday without difficulty Surgical incisions all healing well with no evidence of residual infection on physical exam   Labs:  Recent Labs Lab 04/11/15 0708 04/12/15 0545 04/13/15 1517  CREATININE 5.03* 3.67* 5.34*    Recent Labs Lab 04/11/15 0708 04/12/15 0545 04/13/15 1517  NA 145 140 139  K 4.2 3.8 4.0  CL 113* 100* 103  CO2 22 27 24   BUN 43* 23* 40*  CREATININE 5.03* 3.67* 5.34*  GLUCOSE 97 96 143*  CALCIUM 9.4 8.7* 9.0    Recent Labs Lab 04/12/15 0545 04/13/15 1516 04/14/15 0607  WBC 8.3 7.5 7.8  HGB 10.5* 9.7* 9.6*  HCT 31.9* 29.2* 28.5*  PLT 137* 142* 126*    Recent Labs Lab 04/12/15 0545  INR 1.05    I/O last 3 completed shifts: In: 1080 [P.O.:1080] Out: 9292 [Urine:1550; Stool:1]  Imaging: No results found.  Assessment/Plan:  POD #2   LOS: 4 days  s/p Procedure(s): REVISION WITH INSERTION OF NEW 6 MM GORTEX GRAFT ,REMOVAL INFECTED SEGMENT  AV GORTEX GRAFT LEFT UPPER ARM  I will see patient in follow-up in 2 weeks in the office  [name], MD 04/14/2015 12:57 PM

## 2015-05-12 NOTE — Interval H&P Note (Signed)
History and Physical Interval Note:  05/12/2015 12:27 PM  Diane [name]  has presented today for surgery, with the diagnosis of Clotted left upper arm arteriovenous graft T82.868 A  The various methods of treatment have been discussed with the patient and family. After consideration of risks, benefits and other options for treatment, the patient has consented to  Procedure(s): THROMBECTOMY ARTERIOVENOUS GORE-TEX GRAFT (Left) as a surgical intervention .  The patient's history has been reviewed, patient examined, no change in status, stable for surgery.  I have reviewed the patient's chart and labs.  Questions were answered to the patient's satisfaction.     [name]

## 2015-05-12 NOTE — Op Note (Signed)
OPERATIVE REPORT  Date of Surgery: 05/12/2015  Surgeon: [name], MD  Assistant: Nurse  Pre-op Diagnosis:  Clotted left upper arm arteriovenous graft T82.868 A  Post-op Diagnosis: Clotted left upper arm arteriovenous graft T82.868 A  Procedure: Procedure(s): THROMBECTOMY ARTERIOVENOUS GORE-TEX GRAFT WITHREVISION VENOUS END LEFT UPPER ARM  Anesthesia: LMA  EBL: Minimal  Complications: None  Procedure Details: The patient was taken to the operating room placed in supine position at which time satisfactory general LMA anesthesia was administered. Left upper extremity was prepped Betadine scrub and solution draped in routine sterile manner. A short incision was made near the Gore-Tex to axillary vein anastomosis in the axilla carried down to subcutaneous tissue and the anastomosis dissected free. The anastomosis was in the side into the axillary vein. There were 2 large branches. All of these were ligated with 2-0 silk ties and divided. There was circumferential thickening at the anastomotic area and it was suspected that there was an wall hyperplasia causing the failure of the graft. No heparin was given. Graft was transected just proximal to the anastomosis and it was in fact very thickened and stenotic at this area. The old anastomotic area was excised and the vein proximal to this was 5 mm in size. Fogarty catheter could be passed centrally as far as possible into the superior vena cava with no stenosis. Graft itself was easily thrombectomized with excellent flow being reestablished. Graft was long enough to be reanastomosed to the proximal axillary vein since it was converted to an end and anastomosis. The vein was slightly spatulated and end-to-end anastomosis done with 60 proline clamps released there was excellent pulse and palpable thrill in the graft. No heparin or protamine was given. Adequate hemostasis achieved wound closed in layers with 5: Subcuticular fashion with Dermabond  patient taken to recovery in satisfactory condition   [name], MD 05/12/2015 2:25 PM

## 2015-05-12 NOTE — Transfer of Care (Signed)
Immediate Anesthesia Transfer of Care Note  Patient: Diane [name]  Procedure(s) Performed: Procedure(s): THROMBECTOMY ARTERIOVENOUS GORE-TEX GRAFT WITHREVISION VENOUS END LEFT UPPER ARM (Left)  Patient Location: PACU  Anesthesia Type:General  Level of Consciousness: awake, alert , oriented and patient cooperative  Airway & Oxygen Therapy: Patient Spontanous Breathing and Patient connected to nasal cannula oxygen  Post-op Assessment: Report given to RN, Post -op Vital signs reviewed and stable and Patient moving all extremities  Post vital signs: Reviewed and stable  Last Vitals:  Filed Vitals:   05/12/15 1150  BP: 132/63  Pulse: 85  Temp: 36.2 C  Resp: 16    Complications: No apparent anesthesia complications

## 2015-05-12 NOTE — Anesthesia Postprocedure Evaluation (Signed)
  Anesthesia Post-op Note  Patient: Diane [name]  Procedure(s) Performed: Procedure(s): THROMBECTOMY ARTERIOVENOUS GORE-TEX GRAFT WITHREVISION VENOUS END LEFT UPPER ARM (Left)  Patient Location: PACU  Anesthesia Type:General  Level of Consciousness: awake, alert  and oriented  Airway and Oxygen Therapy: Patient Spontanous Breathing  Post-op Pain: none  Post-op Assessment: Post-op Vital signs reviewed              Post-op Vital Signs: Reviewed  Last Vitals:  Filed Vitals:   05/12/15 1525  BP: 116/54  Pulse: 79  Temp: 36.5 C  Resp: 15    Complications: No apparent anesthesia complications

## 2015-05-15 ENCOUNTER — Encounter (HOSPITAL_COMMUNITY): Payer: Self-pay | Admitting: Vascular Surgery

## 2015-05-16 ENCOUNTER — Encounter (HOSPITAL_COMMUNITY): Payer: Self-pay | Admitting: *Deleted

## 2015-05-16 ENCOUNTER — Other Ambulatory Visit: Payer: Self-pay

## 2015-05-16 NOTE — Progress Notes (Signed)
During pre-op call pt stated that she does NOT want another graft or a dialysis catheter if this graft is unable to be fixed. Pt states she is tired and doesn't feel that dialysis has improved her quality of life. I encouraged her to talk with Dr. Bridgett Larsson in the AM to let him know her wishes. I asked her if she has talked with her son and she stated yes. Pt very tearful during pre-op call. She also mentioned that her left arm has been numb since her surgery last week.

## 2015-05-17 ENCOUNTER — Ambulatory Visit (HOSPITAL_COMMUNITY): Payer: Medicare Other | Admitting: Certified Registered Nurse Anesthetist

## 2015-05-17 ENCOUNTER — Encounter (HOSPITAL_COMMUNITY): Payer: Self-pay | Admitting: *Deleted

## 2015-05-17 ENCOUNTER — Encounter (HOSPITAL_COMMUNITY)
Admission: RE | Disposition: A | Discharge status: Home / Self Care | Payer: Medicare Other | Source: Ambulatory Visit | Attending: Vascular Surgery

## 2015-05-17 ENCOUNTER — Ambulatory Visit (HOSPITAL_COMMUNITY)
Admission: RE | Admit: 2015-05-17 | Discharge: 2015-05-17 | Disposition: A | Discharge status: Home / Self Care | Payer: Medicare Other | Source: Ambulatory Visit | Attending: Vascular Surgery | Admitting: Vascular Surgery

## 2015-05-17 DIAGNOSIS — Z951 Presence of aortocoronary bypass graft: Secondary | ICD-10-CM | POA: Diagnosis not present

## 2015-05-17 DIAGNOSIS — M1990 Unspecified osteoarthritis, unspecified site: Secondary | ICD-10-CM | POA: Insufficient documentation

## 2015-05-17 DIAGNOSIS — Z992 Dependence on renal dialysis: Secondary | ICD-10-CM | POA: Insufficient documentation

## 2015-05-17 DIAGNOSIS — Z79899 Other long term (current) drug therapy: Secondary | ICD-10-CM | POA: Insufficient documentation

## 2015-05-17 DIAGNOSIS — I120 Hypertensive chronic kidney disease with stage 5 chronic kidney disease or end stage renal disease: Secondary | ICD-10-CM | POA: Insufficient documentation

## 2015-05-17 DIAGNOSIS — F319 Bipolar disorder, unspecified: Secondary | ICD-10-CM | POA: Diagnosis not present

## 2015-05-17 DIAGNOSIS — T82858S Stenosis of vascular prosthetic devices, implants and grafts, sequela: Secondary | ICD-10-CM | POA: Diagnosis not present

## 2015-05-17 DIAGNOSIS — D631 Anemia in chronic kidney disease: Secondary | ICD-10-CM | POA: Diagnosis not present

## 2015-05-17 DIAGNOSIS — N2581 Secondary hyperparathyroidism of renal origin: Secondary | ICD-10-CM | POA: Diagnosis not present

## 2015-05-17 DIAGNOSIS — I2510 Atherosclerotic heart disease of native coronary artery without angina pectoris: Secondary | ICD-10-CM | POA: Diagnosis not present

## 2015-05-17 DIAGNOSIS — T82868A Thrombosis of vascular prosthetic devices, implants and grafts, initial encounter: Secondary | ICD-10-CM | POA: Insufficient documentation

## 2015-05-17 DIAGNOSIS — Z7982 Long term (current) use of aspirin: Secondary | ICD-10-CM | POA: Insufficient documentation

## 2015-05-17 DIAGNOSIS — N186 End stage renal disease: Secondary | ICD-10-CM | POA: Diagnosis not present

## 2015-05-17 DIAGNOSIS — G709 Myoneural disorder, unspecified: Secondary | ICD-10-CM | POA: Diagnosis not present

## 2015-05-17 DIAGNOSIS — G629 Polyneuropathy, unspecified: Secondary | ICD-10-CM | POA: Insufficient documentation

## 2015-05-17 HISTORY — PX: THROMBECTOMY W/ EMBOLECTOMY: SHX2507

## 2015-05-17 LAB — POCT I-STAT 4, (NA,K, GLUC, HGB,HCT)
Glucose, Bld: 132 mg/dL — ABNORMAL HIGH (ref 65–99)
HCT: 37 % (ref 36.0–46.0)
Hemoglobin: 12.6 g/dL (ref 12.0–15.0)
Potassium: 4.7 mmol/L (ref 3.5–5.1)
SODIUM: 146 mmol/L — AB (ref 135–145)

## 2015-05-17 SURGERY — THROMBECTOMY ARTERIOVENOUS GORE-TEX GRAFT
Anesthesia: General | Site: Arm Upper | Laterality: Left

## 2015-05-17 MED ORDER — FENTANYL CITRATE (PF) 100 MCG/2ML IJ SOLN
25.0000 ug | INTRAMUSCULAR | Status: DC | PRN
  Administered 2015-05-17: 25 ug via INTRAVENOUS

## 2015-05-17 MED ORDER — PROTAMINE SULFATE 10 MG/ML IV SOLN
INTRAVENOUS | Status: AC
  Filled 2015-05-17: qty 5

## 2015-05-17 MED ORDER — VANCOMYCIN HCL IN DEXTROSE 1-5 GM/200ML-% IV SOLN
INTRAVENOUS | Status: AC
  Filled 2015-05-17: qty 200

## 2015-05-17 MED ORDER — FENTANYL CITRATE (PF) 100 MCG/2ML IJ SOLN
INTRAMUSCULAR | Status: AC
  Administered 2015-05-17: 25 ug via INTRAVENOUS
  Filled 2015-05-17: qty 2

## 2015-05-17 MED ORDER — OXYCODONE HCL 5 MG PO TABS: 5.0000 mg | ORAL_TABLET | Freq: Once | ORAL | Status: DC

## 2015-05-17 MED ORDER — FENTANYL CITRATE (PF) 250 MCG/5ML IJ SOLN
INTRAMUSCULAR | Status: AC
  Filled 2015-05-17: qty 5

## 2015-05-17 MED ORDER — PHENYLEPHRINE HCL 10 MG/ML IJ SOLN
INTRAMUSCULAR | Status: DC | PRN
  Administered 2015-05-17: 80 ug via INTRAVENOUS
  Administered 2015-05-17: 40 ug via INTRAVENOUS
  Administered 2015-05-17: 80 ug via INTRAVENOUS
  Administered 2015-05-17: 40 ug via INTRAVENOUS
  Administered 2015-05-17: 80 ug via INTRAVENOUS

## 2015-05-17 MED ORDER — PHENYLEPHRINE 40 MCG/ML (10ML) SYRINGE FOR IV PUSH (FOR BLOOD PRESSURE SUPPORT)
PREFILLED_SYRINGE | INTRAVENOUS | Status: AC
  Filled 2015-05-17: qty 10

## 2015-05-17 MED ORDER — LIDOCAINE HCL (CARDIAC) 20 MG/ML IV SOLN
INTRAVENOUS | Status: DC | PRN
  Administered 2015-05-17: 90 mg via INTRAVENOUS

## 2015-05-17 MED ORDER — THROMBIN 20000 UNITS EX SOLR
CUTANEOUS | Status: AC
  Filled 2015-05-17: qty 20000

## 2015-05-17 MED ORDER — HEPARIN SODIUM (PORCINE) 1000 UNIT/ML IJ SOLN
INTRAMUSCULAR | Status: AC
  Filled 2015-05-17: qty 1

## 2015-05-17 MED ORDER — BUPIVACAINE HCL 0.5 % IJ SOLN
INTRAMUSCULAR | Status: AC
  Filled 2015-05-17: qty 1

## 2015-05-17 MED ORDER — PROPOFOL 10 MG/ML IV BOLUS
INTRAVENOUS | Status: DC | PRN
  Administered 2015-05-17: 90 mg via INTRAVENOUS

## 2015-05-17 MED ORDER — FENTANYL CITRATE (PF) 100 MCG/2ML IJ SOLN
INTRAMUSCULAR | Status: DC | PRN
  Administered 2015-05-17: 25 ug via INTRAVENOUS

## 2015-05-17 MED ORDER — VANCOMYCIN HCL IN DEXTROSE 1-5 GM/200ML-% IV SOLN
1000.0000 mg | INTRAVENOUS | Status: AC
  Administered 2015-05-17: 1000 mg via INTRAVENOUS

## 2015-05-17 MED ORDER — SODIUM CHLORIDE 0.9 % IV SOLN
INTRAVENOUS | Status: DC
  Administered 2015-05-17: 14:00:00 via INTRAVENOUS
  Administered 2015-05-17: 35 mL/h via INTRAVENOUS

## 2015-05-17 MED ORDER — HEPARIN SODIUM (PORCINE) 1000 UNIT/ML IJ SOLN
INTRAMUSCULAR | Status: DC | PRN
  Administered 2015-05-17: 5000 [IU] via INTRAVENOUS

## 2015-05-17 MED ORDER — CHLORHEXIDINE GLUCONATE CLOTH 2 % EX PADS: 6.0000 | MEDICATED_PAD | Freq: Once | CUTANEOUS | Status: DC

## 2015-05-17 MED ORDER — LIDOCAINE-EPINEPHRINE (PF) 1 %-1:200000 IJ SOLN
INTRAMUSCULAR | Status: AC
  Filled 2015-05-17: qty 10

## 2015-05-17 MED ORDER — 0.9 % SODIUM CHLORIDE (POUR BTL) OPTIME
TOPICAL | Status: DC | PRN
  Administered 2015-05-17: 1000 mL

## 2015-05-17 MED ORDER — LIDOCAINE HCL (CARDIAC) 20 MG/ML IV SOLN
INTRAVENOUS | Status: AC
  Filled 2015-05-17: qty 5

## 2015-05-17 MED ORDER — PROTAMINE SULFATE 10 MG/ML IV SOLN
INTRAVENOUS | Status: DC | PRN
  Administered 2015-05-17 (×3): 5 mg via INTRAVENOUS

## 2015-05-17 MED ORDER — SODIUM CHLORIDE 0.9 % IR SOLN
Status: DC | PRN
  Administered 2015-05-17: 500 mL

## 2015-05-17 MED ORDER — THROMBIN 20000 UNITS EX KIT
PACK | CUTANEOUS | Status: DC | PRN
  Administered 2015-05-17: 20 mL via TOPICAL

## 2015-05-17 MED ORDER — OXYCODONE HCL 5 MG PO TABS: 5.0000 mg | ORAL_TABLET | Freq: Four times a day (QID) | ORAL | Status: AC | PRN

## 2015-05-17 SURGICAL SUPPLY — 71 items
ARMBAND PINK RESTRICT EXTREMIT (MISCELLANEOUS) ×4 IMPLANT
BAG DECANTER FOR FLEXI CONT (MISCELLANEOUS) ×4 IMPLANT
BANDAGE ESMARK 6X9 LF (GAUZE/BANDAGES/DRESSINGS) ×1 IMPLANT
BIOPATCH RED 1 DISK 7.0 (GAUZE/BANDAGES/DRESSINGS) ×1 IMPLANT
BIOPATCH RED 1IN DISK 7.0MM (GAUZE/BANDAGES/DRESSINGS)
BLADE SURG 11 STRL SS (BLADE) ×3 IMPLANT
BNDG CMPR 9X6 STRL LF SNTH (GAUZE/BANDAGES/DRESSINGS) ×2
BNDG ESMARK 6X9 LF (GAUZE/BANDAGES/DRESSINGS) ×4
CANISTER SUCTION 2500CC (MISCELLANEOUS) ×4 IMPLANT
CATH CANNON HEMO 15F 50CM (CATHETERS) IMPLANT
CATH CANNON HEMO 15FR 19 (HEMODIALYSIS SUPPLIES) IMPLANT
CATH CANNON HEMO 15FR 23CM (HEMODIALYSIS SUPPLIES) IMPLANT
CATH CANNON HEMO 15FR 31CM (HEMODIALYSIS SUPPLIES) IMPLANT
CATH CANNON HEMO 15FR 32 (HEMODIALYSIS SUPPLIES) IMPLANT
CATH CANNON HEMO 15FR 32CM (HEMODIALYSIS SUPPLIES) IMPLANT
CATH EMB LATEX FREE 3FRX80CM (CATHETERS) ×4
CATH EMB LATEX FREE 4FRX80CM (CATHETERS) ×4
CATH EMB LF 3FRX80 (CATHETERS) ×1 IMPLANT
CATH EMB LF 4FRX80 (CATHETERS) ×1 IMPLANT
CATH STRAIGHT 5FR 65CM (CATHETERS) IMPLANT
CLIP TI MEDIUM 6 (CLIP) ×4 IMPLANT
CLIP TI WIDE RED SMALL 6 (CLIP) ×4 IMPLANT
COVER PROBE W GEL 5X96 (DRAPES) ×1 IMPLANT
CUFF TOURNIQUET SINGLE 18IN (TOURNIQUET CUFF) ×3 IMPLANT
DRAPE C-ARM 42X72 X-RAY (DRAPES) ×1 IMPLANT
DRAPE CHEST BREAST 15X10 FENES (DRAPES) ×1 IMPLANT
ELECT REM PT RETURN 9FT ADLT (ELECTROSURGICAL) ×4
ELECTRODE REM PT RTRN 9FT ADLT (ELECTROSURGICAL) ×2 IMPLANT
GAUZE SPONGE 2X2 8PLY STRL LF (GAUZE/BANDAGES/DRESSINGS) ×1 IMPLANT
GAUZE SPONGE 4X4 16PLY XRAY LF (GAUZE/BANDAGES/DRESSINGS) ×1 IMPLANT
GEL ULTRASOUND 20GR AQUASONIC (MISCELLANEOUS) ×3 IMPLANT
GLOVE BIOGEL PI IND STRL 6.5 (GLOVE) ×1 IMPLANT
GLOVE BIOGEL PI IND STRL 7.5 (GLOVE) ×2 IMPLANT
GLOVE BIOGEL PI INDICATOR 6.5 (GLOVE) ×2
GLOVE BIOGEL PI INDICATOR 7.5 (GLOVE) ×2
GLOVE SURG SS PI 6.5 STRL IVOR (GLOVE) ×9 IMPLANT
GLOVE SURG SS PI 7.0 STRL IVOR (GLOVE) ×6 IMPLANT
GLOVE SURG SS PI 8.0 STRL IVOR (GLOVE) ×9 IMPLANT
GOWN STRL REUS W/ TWL LRG LVL3 (GOWN DISPOSABLE) ×7 IMPLANT
GOWN STRL REUS W/ TWL XL LVL3 (GOWN DISPOSABLE) ×1 IMPLANT
GOWN STRL REUS W/TWL LRG LVL3 (GOWN DISPOSABLE) ×16
GOWN STRL REUS W/TWL XL LVL3 (GOWN DISPOSABLE) ×4
KIT BASIN OR (CUSTOM PROCEDURE TRAY) ×4 IMPLANT
KIT ROOM TURNOVER OR (KITS) ×4 IMPLANT
LIQUID BAND (GAUZE/BANDAGES/DRESSINGS) ×4 IMPLANT
NDL 18GX1X1/2 (RX/OR ONLY) (NEEDLE) ×1 IMPLANT
NDL HYPO 25GX1X1/2 BEV (NEEDLE) ×1 IMPLANT
NEEDLE 18GX1X1/2 (RX/OR ONLY) (NEEDLE) IMPLANT
NEEDLE HYPO 25GX1X1/2 BEV (NEEDLE) IMPLANT
NS IRRIG 1000ML POUR BTL (IV SOLUTION) ×4 IMPLANT
PACK CV ACCESS (CUSTOM PROCEDURE TRAY) ×4 IMPLANT
PAD ARMBOARD 7.5X6 YLW CONV (MISCELLANEOUS) ×8 IMPLANT
SET MICROPUNCTURE 5F STIFF (MISCELLANEOUS) IMPLANT
SOAP 2 % CHG 4 OZ (WOUND CARE) ×1 IMPLANT
SPONGE GAUZE 2X2 STER 10/PKG (GAUZE/BANDAGES/DRESSINGS)
SPONGE SURGIFOAM ABS GEL 100 (HEMOSTASIS) ×3 IMPLANT
SUT ETHILON 3 0 PS 1 (SUTURE) ×1 IMPLANT
SUT MNCRL AB 4-0 PS2 18 (SUTURE) ×7 IMPLANT
SUT PROLENE 5 0 C 1 24 (SUTURE) ×3 IMPLANT
SUT PROLENE 6 0 BV (SUTURE) ×4 IMPLANT
SUT PROLENE 7 0 BV175 8 (SUTURE) ×6 IMPLANT
SUT VIC AB 3-0 SH 27 (SUTURE) ×8
SUT VIC AB 3-0 SH 27X BRD (SUTURE) ×3 IMPLANT
SYR 20CC LL (SYRINGE) ×2 IMPLANT
SYR 3ML LL SCALE MARK (SYRINGE) ×4 IMPLANT
SYR 5ML LL (SYRINGE) ×1 IMPLANT
SYR CONTROL 10ML LL (SYRINGE) ×4 IMPLANT
SYRINGE 10CC LL (SYRINGE) ×1 IMPLANT
UNDERPAD 30X30 INCONTINENT (UNDERPADS AND DIAPERS) ×4 IMPLANT
WATER STERILE IRR 1000ML POUR (IV SOLUTION) ×4 IMPLANT
WIRE AMPLATZ SS-J .035X180CM (WIRE) IMPLANT

## 2015-05-17 NOTE — Anesthesia Procedure Notes (Signed)
Procedure Name: LMA Insertion Date/Time: 05/17/2015 12:23 PM Performed by: Suzy Bouchard Pre-anesthesia Checklist: Emergency Drugs available, Timeout performed, Patient identified, Suction available and Patient being monitored Patient Re-evaluated:Patient Re-evaluated prior to inductionOxygen Delivery Method: Circle system utilized Preoxygenation: Pre-oxygenation with 100% oxygen Intubation Type: IV induction Ventilation: Mask ventilation without difficulty LMA: LMA inserted LMA Size: 4.0 Number of attempts: 1 Placement Confirmation: positive ETCO2 and breath sounds checked- equal and bilateral Tube secured with: Tape

## 2015-05-17 NOTE — Transfer of Care (Signed)
Immediate Anesthesia Transfer of Care Note  Patient: Diane [name]  Procedure(s) Performed: Procedure(s): THROMBECTOMY & REVISION   OF LEFT UPPER ARM ARTERIOVENOUS GORE-TEX GRAFT (Left)  Patient Location: PACU  Anesthesia Type:General  Level of Consciousness: awake, alert  and oriented  Airway & Oxygen Therapy: Patient Spontanous Breathing and Patient connected to nasal cannula oxygen  Post-op Assessment: Report given to RN and Post -op Vital signs reviewed and stable  Post vital signs: Reviewed and stable  Last Vitals:  Filed Vitals:   05/17/15 1419  BP:   Pulse:   Temp: 36.5 C  Resp:     Complications: No apparent anesthesia complications

## 2015-05-17 NOTE — Anesthesia Preprocedure Evaluation (Addendum)
Anesthesia Evaluation  Patient identified by MRN, date of birth, ID band  Airway Mallampati: II     Mouth opening: Limited Mouth Opening  Dental  (+) Teeth Intact   Pulmonary neg pulmonary ROS,  breath sounds clear to auscultation        Cardiovascular + angina + CAD Rhythm:Regular Rate:Normal     Neuro/Psych peripheral neuropathy  Neuromuscular disease    GI/Hepatic   Endo/Other    Renal/GU ESRFRenal disease     Musculoskeletal  (+) Arthritis -,   Abdominal   Peds  Hematology  (+) anemia ,   Anesthesia Other Findings   Reproductive/Obstetrics                            Anesthesia Physical Anesthesia Plan  ASA: III  Anesthesia Plan: General   Post-op Pain Management:    Induction: Intravenous  Airway Management Planned: LMA  Additional Equipment:   Intra-op Plan:   Post-operative Plan: Extubation in OR  Informed Consent: I have reviewed the patients History and Physical, chart, labs and discussed the procedure including the risks, benefits and alternatives for the proposed anesthesia with the patient or authorized representative who has indicated his/her understanding and acceptance.     Plan Discussed with: CRNA and Surgeon  Anesthesia Plan Comments:         Anesthesia Quick Evaluation

## 2015-05-17 NOTE — H&P (Signed)
Brief History and Physical  History of Present Illness  CARLEIGH BUCCIERI is a 80 y.o. female who presents with chief complaint: end stage renal disease with rethrombosis.  This patient recently had a T&R on 05/12/15.  The patient presents today for TE LUA AVG, possible TDC placement.    Past Medical History  Diagnosis Date  . Anemia in chronic kidney disease(285.21)   . Bipolar disorder, unspecified   . Iron deficiency anemia, unspecified   . Secondary hyperparathyroidism (of renal origin)   . Restless legs syndrome (RLS)   . Unspecified hypertensive kidney disease with chronic kidney disease stage V or end stage renal disease   . 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 Personal history of colonic polyps   . Diverticulosis of colon (without mention of hemorrhage) 2006    Colonoscopy Dr. Oletta Lamas  . Coronary artery disease   . S/P CABG x 5, 08/15/12, LIMA-LAD;VG-diag;VG-OM; seq VG-PDA,PLA  08/18/2012  . Anxiety   . Depression   . Anginal pain     has ntg but has never used them  . Kidney stone   . Arthritis     right hip  . Cancer     melanoma - ankle (left)    Past Surgical History  Procedure Laterality Date  . Arteriovenous graft placement  11/2010    left upper - AVGG Dr. Donnetta Hutching  . Arteriovenous graft placement  12/2009    left lower - AV[facility] Dr. Donnetta Hutching  . Av fistula placement  07/2009    right upper AVF Dr. Donnetta Hutching  . Av fistula placement  01/2002    right lower Dr. Donnetta Hutching  . Cardiac catheterization  08/12/2012  . Abdominal hysterectomy    . Coronary artery bypass graft  08/14/2012    Procedure: CORONARY ARTERY BYPASS GRAFTING (CABG);  Surgeon: Gaye Pollack, MD;  Location: [place];  Service: Open Heart Surgery;  Laterality: N/A;  times five, using left internal mammary  . Endovein harvest of greater saphenous vein  08/14/2012    Procedure: ENDOVEIN HARVEST OF GREATER SAPHENOUS VEIN;  Surgeon: Gaye Pollack,  MD;  Location: [place];  Service: Open Heart Surgery;  Laterality: Bilateral;  . Eye surgery    . Left and right heart catheterization with coronary/graft angiogram  08/12/2012    Procedure: LEFT AND RIGHT HEART CATHETERIZATION WITH Beatrix Fetters;  Surgeon: Troy Sine, MD;  Location: [facility] CATH LAB;  Service: Cardiovascular;;  . Revision of arteriovenous goretex graft Left 02/27/2015    Procedure: EXCISION OF ULCER AND REVISION OF LEFT UPPER ARM ARTERIOVENOUS GORETEX GRAFT;  Surgeon: Rosetta Posner, MD;  Location: [place];  Service: Vascular;  Laterality: Left;  . Revision of arteriovenous goretex graft Left 04/12/2015    Procedure: REVISION WITH INSERTION OF NEW 6 MM GORTEX GRAFT ,REMOVAL INFECTED SEGMENT  AV GORTEX GRAFT LEFT UPPER ARM;  Surgeon: Mal Misty, MD;  Location: [place];  Service: Vascular;  Laterality: Left;  . Thrombectomy w/ embolectomy Left 05/12/2015    Procedure: THROMBECTOMY ARTERIOVENOUS GORE-TEX GRAFT WITHREVISION VENOUS END LEFT UPPER ARM;  Surgeon: Mal Misty, MD;  Location: [place];  Service: Vascular;  Laterality: Left;    History   Social History  . Marital Status: Married    Spouse Name: N/A  . Number of Children: N/A  . Years of Education: N/A   Occupational History  . Not on file.  Social History Main Topics  . Smoking status: Never Smoker   . Smokeless tobacco: Never Used  . Alcohol Use: No  . Drug Use: No  . Sexual Activity: No   Other Topics Concern  . Not on file   Social History Narrative    History reviewed. No pertinent family history.  No current facility-administered medications on file prior to encounter.   Current Outpatient Prescriptions on File Prior to Encounter  Medication Sig Dispense Refill  . aspirin 81 MG tablet Take 81 mg by mouth See admin instructions. Only take on mondays, wed, fridays, sundays    . calcium acetate (PHOSLO) 667 MG capsule Take 667 mg by mouth 3 (three) times daily with meals.     Marland Kitchen ceFAZolin  (ANCEF) 2-3 GM-% SOLR Inject 50 mLs (2 g total) into the vein Every Tuesday,Thursday,and Saturday with dialysis. 50 mL 0  . clorazepate (TRANXENE) 3.75 MG tablet Take 3.75 mg by mouth 2 (two) times daily.     . Darbepoetin Alfa (ARANESP) 60 MCG/0.3ML SOSY injection Inject 0.3 mLs (60 mcg total) into the vein every Saturday with hemodialysis. 4.2 mL 0  . gabapentin (NEURONTIN) 100 MG capsule Take 200 mg by mouth at bedtime. Takes 1 tablet after dialysis, then 2 tabs at bedtime    . hydrOXYzine (ATARAX/VISTARIL) 50 MG tablet Take 50 mg by mouth at bedtime.     . midodrine (PROAMATINE) 10 MG tablet Take 1 tablet (10 mg total) by mouth 2 (two) times daily with a meal. 60 tablet 0  . polyethylene glycol (MIRALAX / GLYCOLAX) packet Take 17 g by mouth daily as needed. For constipation    . Polyvinyl Alcohol-Povidone (REFRESH OP) Place 1 tablet into both eyes daily as needed. For dry eyes    . acetaminophen (TYLENOL) 500 MG tablet Take 500 mg by mouth every 6 (six) hours as needed.    Marland Kitchen NITROSTAT 0.4 MG SL tablet Place 0.4 mg under the tongue every 5 (five) minutes as needed.     Marland Kitchen oxyCODONE (ROXICODONE) 5 MG immediate release tablet Take 1 tablet (5 mg total) by mouth every 6 (six) hours as needed. 20 tablet 0    Allergies  Allergen Reactions  . Hydrocodone-Acetaminophen Itching  . Ambien [Zolpidem Tartrate]     Stays awake  . Amoxicillin Diarrhea  . Clavulanic Acid   . Sulfa Antibiotics Other (See Comments)    Migraine   . Latex Rash  . Tape Rash    Can only use paper tape     Review of Systems: As listed above, otherwise negative.  Physical Examination  Filed Vitals:   05/17/15 0902 05/17/15 0910  BP:  138/61  Pulse:  82  Temp:  97 F (36.1 C)  TempSrc:  Oral  Resp:  18  Height:  5\' 1"  (1.549 m)  Weight: 117 lb 1 oz (53.1 kg)   SpO2:  100%    General: A&O x 3, WDWN  Pulmonary: Sym exp, good air movt, CTAB, no rales, rhonchi, & wheezing  Cardiac: RRR, Nl S1, S2, no Murmurs,  rubs or gallops  Gastrointestinal: soft, NTND, -G/R, - HSM, - masses, - CVAT B  Musculoskeletal: M/S 5/5 throughout , Extremities without ischemic changes , multiple thrombosed LUA AVG  Laboratory See iStat  Medical Decision Making  BRELEIGH CARPINO is a 80 y.o. female who presents with: ESRD, thrombosed LUA AVG.   The patient is scheduled for: TE LUA AVG, possible TDC placement  Risk, benefits, and alternatives to  access surgery were discussed.  The patient is aware the risks include but are not limited to: bleeding, infection, steal syndrome, nerve damage, ischemic monomelic neuropathy, failure to mature, and need for additional procedures. The patient is aware the risks of tunneled dialysis catheter placement include but are not limited to: bleeding, infection, central venous injury, pneumothorax, possible venous stenosis, possible malpositioning in the venous system, and possible infections related to long-term catheter presence.   The patient is aware of the risks and agrees to proceed.  Adele Barthel, MD [facility] Office: [phone] Pager: [phone]  05/17/2015, 12:03 PM

## 2015-05-17 NOTE — Op Note (Signed)
OPERATIVE NOTE   PROCEDURE: 1. Thrombectomy and revision of left upper arm graft  PRE-OPERATIVE DIAGNOSIS: thrombosed left upper arm arteriovenous graft   POST-OPERATIVE DIAGNOSIS: same as above   SURGEON: Adele Barthel, MD  ASSISTANT(S): Silva Bandy, PAC   ANESTHESIA: general  ESTIMATED BLOOD LOSS: 50 cc  FINDING(S): 1.  Widely patent arterial anastomosis without neointimal hyperplasia 2.  Two valves just distal to venous anastomosis 3.  Likely central venous stenosis based on resistance on thrombectomy and pulsatile character on continuous doppler at end of case 4.  Palpable thrill at end of case with pulsatile character on doppler  SPECIMEN(S):  none  INDICATIONS:   Diane [name] is a 80 y.o. female who presents with recently thrombosed left upper arm arteriovenous graft after recent revision and thrombectomy.  We discussed also placement of tunneled dialysis catheter in the event of failure to thrombectomize this graft. Risk, benefits, and alternatives to access surgery were discussed.  The patient is aware the risks include but are not limited to: bleeding, infection, steal syndrome, nerve damage, ischemic monomelic neuropathy, failure to mature, need for additional procedures, death and stroke.  The patient agrees to proceed forward with the procedure.   DESCRIPTION: After obtaining full informed written consent, the patient was brought back to the operating room and placed supine upon the operating table.  The patient received IV antibiotics prior to induction.  After obtaining adequate anesthesia, the patient was prepped and draped in the standard fashion for: left arm access procedure.  At Dr. Evelena Leyden suggestion, I made and incision over the arterial anastomosis and dissected out the graft onto its anastomosis.  The prior forearm graft, however, obscured the distal brachial artery extent as it was still anastomosed to the brachial artery.  I gave the patient 5000 units  of Heparin intravenously which was a therapeutic bolus.  I placed a sterile tourniquet around the upper arm.  After waiting 3 minutes, I exsanguinated the left arm with an Esmark tourniquet.  I inflated the tourniquet at 250 mm Hg.  I made a horizontal graftotomy 1 cm distal to the arterial anastomosis.  I extract thrombus from the proximal graft with a 3 Fogarty balloon and then passed it distally to remove thrombus from the distal graft.  There was no thrombus in the artery and the arterial anastomosis was widely patent.  I then clamped the distal graft and then dropped the tourniquet.  I passed the 4 Fogarty distally, extracting acute thrombus.  The catheter only advanced to the level of the venous anastomosis.  I tried also with a 3 Fogarty also, again stopping at the level of the anastomosis.  I flushed the graft with heparinized saline.  I repaired the grafotomy with a running suture of 7-0 Prolene.  I packed thrombin and gelfoam into this exposure.    I turned my attention to the axilla.  I opened the recent axillary exposure and transected prior sutures.  I washed out the axilla.  There was a strong pulse in the graft at the level of the anastomosis, so I clamped the graft slightly distal to the anastomosis.  I then transected the prior suture line.  It became evident immediately there was minimal thrombus proximal to the anastomosis.  I passed the 4 Fogarty proximally, extracting minimal thrombus.  There was obvious resistance at least twice proximally.  This was reproducible on subsequent passes.  There was return of some minimal backbleeding.  I completely took down the suture line.  I became evident two valves that might have accounted for the inability to pass the venous anastomosis.  I sharply resected both valves.  I resewed the graft to the vein in an end-to-end fashion, sewing the anastomosis ~5 mm more proximal in an aneurysmorrhapy fashion to avoid incorporating the prior sclerotic vein edges.   Prior to completing this anastomosis, I backbled the vein and the same limited backbleeding was present.  The antegrade flow from the arterial side was pulsatile.  Neither end had thrombus.  I completed the anastomosis in the usual fashion.  I back thrombin and gelfoam into this exposure also and then released all clamps.  With time there was return of a thrill in the graft but there was a pulsatile character in the doppler signal consistent with a more proximal stenosis.  I gave 15 mg of Protamine to reverse anticoagulation.  I washed out both incisions.  The arterial exposure had stopped bleeding but the venous exposure required additional thombin and gelfoam before the bleeding from the prior dissection stopped.  I washed out both incisions and no further active bleeding was present.  Each exposure was repaired with a layer of 3-0 Vicryl and then a running subcuticular of 4-0 Monocryl.  The skin was cleaned, dried, and reinforced with Dermabond at both incisions.    At the end of this case, there was palpable thrill in the left upper arm arteriovenous graft.   COMPLICATIONS: none  CONDITION: stable   Adele Barthel, MD [facility] Office: [phone] Pager: [phone]  05/17/2015, 2:02 PM

## 2015-05-17 NOTE — Anesthesia Postprocedure Evaluation (Signed)
  Anesthesia Post-op Note  Patient: Diane [name]  Procedure(s) Performed: Procedure(s): THROMBECTOMY & REVISION   OF LEFT UPPER ARM ARTERIOVENOUS GORE-TEX GRAFT (Left)  Patient Location: PACU  Anesthesia Type:General  Level of Consciousness: awake and alert   Airway and Oxygen Therapy: Patient Spontanous Breathing  Post-op Pain: mild  Post-op Assessment: Post-op Vital signs reviewed and Patient's Cardiovascular Status Stable              Post-op Vital Signs: stable  Last Vitals:  Filed Vitals:   05/17/15 1535  BP: 120/49  Pulse: 75  Temp:   Resp: 15    Complications: No apparent anesthesia complications

## 2015-05-18 ENCOUNTER — Telehealth: Payer: Self-pay | Admitting: Vascular Surgery

## 2015-05-18 ENCOUNTER — Encounter (HOSPITAL_COMMUNITY): Payer: Self-pay | Admitting: Vascular Surgery

## 2015-05-18 MED FILL — Thrombin For Soln 20000 Unit: CUTANEOUS | Qty: 1 | Status: AC

## 2015-05-18 NOTE — Telephone Encounter (Signed)
-----   Message from Mena Goes, RN sent at 05/17/2015  3:07 PM EDT ----- Regarding: Schedule   ----- Message -----    From: Conrad [place], MD    Sent: 05/17/2015   2:20 PM      To: [facility] [address] Diane [name] [id number] [birth year]  PROCEDURE: Thrombectomy and revision of left upper arm graft  Follow-up: 2 weeks with Dr. Kellie Simmering

## 2015-05-18 NOTE — Telephone Encounter (Signed)
Unable to reach pt by phone, mailed letter, dm

## 2015-05-21 ENCOUNTER — Encounter (HOSPITAL_COMMUNITY)
Admission: RE | Disposition: A | Discharge status: Home / Self Care | Payer: Self-pay | Source: Ambulatory Visit | Attending: Vascular Surgery

## 2015-05-21 ENCOUNTER — Ambulatory Visit (HOSPITAL_COMMUNITY)
Admission: RE | Admit: 2015-05-21 | Discharge: 2015-05-21 | Disposition: A | Discharge status: Home / Self Care | Payer: Medicare Other | Source: Ambulatory Visit | Attending: Vascular Surgery | Admitting: Vascular Surgery

## 2015-05-21 ENCOUNTER — Ambulatory Visit (HOSPITAL_COMMUNITY): Payer: Medicare Other | Admitting: Anesthesiology

## 2015-05-21 ENCOUNTER — Ambulatory Visit (HOSPITAL_COMMUNITY): Payer: Medicare Other

## 2015-05-21 DIAGNOSIS — Z885 Allergy status to narcotic agent status: Secondary | ICD-10-CM | POA: Insufficient documentation

## 2015-05-21 DIAGNOSIS — Z419 Encounter for procedure for purposes other than remedying health state, unspecified: Secondary | ICD-10-CM

## 2015-05-21 DIAGNOSIS — Z951 Presence of aortocoronary bypass graft: Secondary | ICD-10-CM | POA: Diagnosis not present

## 2015-05-21 DIAGNOSIS — N2581 Secondary hyperparathyroidism of renal origin: Secondary | ICD-10-CM | POA: Insufficient documentation

## 2015-05-21 DIAGNOSIS — I2510 Atherosclerotic heart disease of native coronary artery without angina pectoris: Secondary | ICD-10-CM | POA: Insufficient documentation

## 2015-05-21 DIAGNOSIS — N185 Chronic kidney disease, stage 5: Secondary | ICD-10-CM | POA: Diagnosis not present

## 2015-05-21 DIAGNOSIS — N186 End stage renal disease: Secondary | ICD-10-CM | POA: Insufficient documentation

## 2015-05-21 DIAGNOSIS — Z992 Dependence on renal dialysis: Secondary | ICD-10-CM | POA: Diagnosis not present

## 2015-05-21 HISTORY — PX: INSERTION OF DIALYSIS CATHETER: SHX1324

## 2015-05-21 LAB — POCT I-STAT 4, (NA,K, GLUC, HGB,HCT)
GLUCOSE: 115 mg/dL — AB (ref 65–99)
HCT: 32 % — ABNORMAL LOW (ref 36.0–46.0)
Hemoglobin: 10.9 g/dL — ABNORMAL LOW (ref 12.0–15.0)
POTASSIUM: 5.4 mmol/L — AB (ref 3.5–5.1)
Sodium: 143 mmol/L (ref 135–145)

## 2015-05-21 SURGERY — INSERTION OF DIALYSIS CATHETER
Anesthesia: Monitor Anesthesia Care | Laterality: Right

## 2015-05-21 MED ORDER — MEPERIDINE HCL 25 MG/ML IJ SOLN: 6.2500 mg | INTRAMUSCULAR | Status: DC | PRN

## 2015-05-21 MED ORDER — MIDAZOLAM HCL 2 MG/2ML IJ SOLN
INTRAMUSCULAR | Status: AC
  Filled 2015-05-21: qty 2

## 2015-05-21 MED ORDER — HEPARIN SODIUM (PORCINE) 1000 UNIT/ML IJ SOLN
INTRAMUSCULAR | Status: DC | PRN
  Administered 2015-05-21: 4.6 mL

## 2015-05-21 MED ORDER — PROPOFOL 10 MG/ML IV BOLUS
INTRAVENOUS | Status: AC
  Filled 2015-05-21: qty 20

## 2015-05-21 MED ORDER — LIDOCAINE HCL (PF) 1 % IJ SOLN
INTRAMUSCULAR | Status: AC
  Filled 2015-05-21: qty 30

## 2015-05-21 MED ORDER — PROPOFOL 10 MG/ML IV BOLUS
INTRAVENOUS | Status: DC | PRN
  Administered 2015-05-21 (×4): 10 mg via INTRAVENOUS
  Administered 2015-05-21: 20 mg via INTRAVENOUS
  Administered 2015-05-21 (×6): 10 mg via INTRAVENOUS

## 2015-05-21 MED ORDER — HEPARIN SODIUM (PORCINE) 1000 UNIT/ML IJ SOLN
INTRAMUSCULAR | Status: AC
Start: 2015-05-21 — End: 2015-05-21
  Filled 2015-05-21: qty 1

## 2015-05-21 MED ORDER — 0.9 % SODIUM CHLORIDE (POUR BTL) OPTIME
TOPICAL | Status: DC | PRN
  Administered 2015-05-21: 1000 mL

## 2015-05-21 MED ORDER — MIDAZOLAM HCL 2 MG/2ML IJ SOLN
INTRAMUSCULAR | Status: DC | PRN
  Administered 2015-05-21: 2 mg via INTRAVENOUS

## 2015-05-21 MED ORDER — LIDOCAINE HCL (CARDIAC) 20 MG/ML IV SOLN
INTRAVENOUS | Status: DC | PRN
  Administered 2015-05-21: 40 mg via INTRAVENOUS

## 2015-05-21 MED ORDER — CEFAZOLIN SODIUM-DEXTROSE 2-3 GM-% IV SOLR
INTRAVENOUS | Status: DC | PRN
  Administered 2015-05-21: 2 g via INTRAVENOUS

## 2015-05-21 MED ORDER — SODIUM CHLORIDE 0.9 % IR SOLN
Status: DC | PRN
  Administered 2015-05-21: 500 mL

## 2015-05-21 MED ORDER — CEFAZOLIN SODIUM-DEXTROSE 2-3 GM-% IV SOLR
INTRAVENOUS | Status: AC
  Filled 2015-05-21: qty 50

## 2015-05-21 MED ORDER — SODIUM CHLORIDE 0.9 % IV SOLN
INTRAVENOUS | Status: DC | PRN
  Administered 2015-05-21: 08:00:00 via INTRAVENOUS

## 2015-05-21 MED ORDER — LIDOCAINE-EPINEPHRINE (PF) 1 %-1:200000 IJ SOLN
INTRAMUSCULAR | Status: DC | PRN
  Administered 2015-05-21: 20 mL

## 2015-05-21 MED ORDER — FENTANYL CITRATE (PF) 100 MCG/2ML IJ SOLN: 25.0000 ug | INTRAMUSCULAR | Status: DC | PRN

## 2015-05-21 MED ORDER — LIDOCAINE-EPINEPHRINE (PF) 1 %-1:200000 IJ SOLN
INTRAMUSCULAR | Status: AC
  Filled 2015-05-21: qty 10

## 2015-05-21 SURGICAL SUPPLY — 48 items
BAG DECANTER FOR FLEXI CONT (MISCELLANEOUS) ×3 IMPLANT
BIOPATCH BLUE 3/4IN DISK W/1.5 (GAUZE/BANDAGES/DRESSINGS) ×2 IMPLANT
BIOPATCH RED 1 DISK 7.0 (GAUZE/BANDAGES/DRESSINGS) ×1 IMPLANT
BIOPATCH RED 1IN DISK 7.0MM (GAUZE/BANDAGES/DRESSINGS)
CATH CANNON HEMO 15F 50CM (CATHETERS) IMPLANT
CATH CANNON HEMO 15FR 19 (HEMODIALYSIS SUPPLIES) IMPLANT
CATH CANNON HEMO 15FR 23CM (HEMODIALYSIS SUPPLIES) ×2 IMPLANT
CATH CANNON HEMO 15FR 31CM (HEMODIALYSIS SUPPLIES) IMPLANT
CATH CANNON HEMO 15FR 32 (HEMODIALYSIS SUPPLIES) IMPLANT
CATH CANNON HEMO 15FR 32CM (HEMODIALYSIS SUPPLIES) IMPLANT
CATH STRAIGHT 5FR 65CM (CATHETERS) IMPLANT
COVER PROBE W GEL 5X96 (DRAPES) ×3 IMPLANT
DRAPE C-ARM 42X72 X-RAY (DRAPES) ×3 IMPLANT
DRAPE CHEST BREAST 15X10 FENES (DRAPES) ×3 IMPLANT
GAUZE SPONGE 2X2 8PLY STRL LF (GAUZE/BANDAGES/DRESSINGS) ×1 IMPLANT
GAUZE SPONGE 4X4 16PLY XRAY LF (GAUZE/BANDAGES/DRESSINGS) ×3 IMPLANT
GLOVE BIO SURGEON STRL SZ7 (GLOVE) ×1 IMPLANT
GLOVE BIOGEL PI IND STRL 6.5 (GLOVE) IMPLANT
GLOVE BIOGEL PI IND STRL 7.5 (GLOVE) ×1 IMPLANT
GLOVE BIOGEL PI INDICATOR 6.5 (GLOVE) ×2
GLOVE BIOGEL PI INDICATOR 7.5 (GLOVE) ×4
GLOVE SURG SS PI 7.0 STRL IVOR (GLOVE) ×2 IMPLANT
GLOVE SURG SS PI 7.5 STRL IVOR (GLOVE) ×2 IMPLANT
GOWN STRL REUS W/ TWL LRG LVL3 (GOWN DISPOSABLE) ×2 IMPLANT
GOWN STRL REUS W/TWL LRG LVL3 (GOWN DISPOSABLE) ×6
KIT BASIN OR (CUSTOM PROCEDURE TRAY) ×3 IMPLANT
KIT ROOM TURNOVER OR (KITS) ×3 IMPLANT
LIQUID BAND (GAUZE/BANDAGES/DRESSINGS) IMPLANT
NDL 18GX1X1/2 (RX/OR ONLY) (NEEDLE) ×1 IMPLANT
NDL HYPO 25GX1X1/2 BEV (NEEDLE) ×1 IMPLANT
NEEDLE 18GX1X1/2 (RX/OR ONLY) (NEEDLE) ×3 IMPLANT
NEEDLE HYPO 25GX1X1/2 BEV (NEEDLE) ×3 IMPLANT
NS IRRIG 1000ML POUR BTL (IV SOLUTION) ×3 IMPLANT
PACK SURGICAL SETUP 50X90 (CUSTOM PROCEDURE TRAY) ×3 IMPLANT
PAD ARMBOARD 7.5X6 YLW CONV (MISCELLANEOUS) ×6 IMPLANT
SET MICROPUNCTURE 5F STIFF (MISCELLANEOUS) IMPLANT
SOAP 2 % CHG 4 OZ (WOUND CARE) ×3 IMPLANT
SPONGE GAUZE 2X2 STER 10/PKG (GAUZE/BANDAGES/DRESSINGS) ×2
SUT ETHILON 3 0 PS 1 (SUTURE) ×3 IMPLANT
SUT MNCRL AB 4-0 PS2 18 (SUTURE) ×3 IMPLANT
SYR 20CC LL (SYRINGE) ×6 IMPLANT
SYR 3ML LL SCALE MARK (SYRINGE) ×3 IMPLANT
SYR 5ML LL (SYRINGE) ×3 IMPLANT
SYR CONTROL 10ML LL (SYRINGE) ×3 IMPLANT
SYRINGE 10CC LL (SYRINGE) ×3 IMPLANT
TAPE PAPER 2X10 WHT MICROPORE (GAUZE/BANDAGES/DRESSINGS) ×2 IMPLANT
WATER STERILE IRR 1000ML POUR (IV SOLUTION) ×3 IMPLANT
WIRE AMPLATZ SS-J .035X180CM (WIRE) IMPLANT

## 2015-05-21 NOTE — Anesthesia Postprocedure Evaluation (Signed)
  Anesthesia Post-op Note  Patient: Diane [name]  Procedure(s) Performed: Procedure(s): INSERTION OF DIALYSIS CATHETER Right Internal Jugular (Right)  Patient Location: PACU  Anesthesia Type: MAC   Level of Consciousness: awake, alert  and oriented  Airway and Oxygen Therapy: Patient Spontanous Breathing  Post-op Pain: mild  Post-op Assessment: Post-op Vital signs reviewed  Post-op Vital Signs: Reviewed  Last Vitals:  Filed Vitals:   05/21/15 0924  BP: 128/60  Pulse: 76  Temp:   Resp: 10    Complications: No apparent anesthesia complications

## 2015-05-21 NOTE — Interval H&P Note (Signed)
History and Physical Interval Note:  05/21/2015 7:35 AM  Diane [name]  has presented today for surgery, with the diagnosis of /  The various methods of treatment have been discussed with the patient and family. After consideration of risks, benefits and other options for treatment, the patient has consented to  Procedure(s): INSERTION OF DIALYSIS CATHETER (N/A) as a surgical intervention .  The patient's history has been reviewed, patient examined, no change in status, stable for surgery.  I have reviewed the patient's chart and labs.  Questions were answered to the patient's satisfaction.     Adele Barthel

## 2015-05-21 NOTE — Op Note (Signed)
    OPERATIVE NOTE  PROCEDURE: 1. Right internal jugular vein tunneled dialysis catheter placement 2. Right internal jugular vein cannulation under ultrasound guidance  PRE-OPERATIVE DIAGNOSIS: end-stage renal failure  POST-OPERATIVE DIAGNOSIS: same as above  SURGEON: Adele Barthel, MD  ANESTHESIA: local and MAC  ESTIMATED BLOOD LOSS: 30 cc  FINDING(S): 1.  Tips of the catheter in the right atrium on fluoroscopy 2.  No obvious pneumothorax on fluoroscopy  SPECIMEN(S):  none  INDICATIONS:   Diane [name] is a 80 y.o. female who presents with end stage renal disease.  The patient presents for tunneled dialysis catheter placement.  The patient is aware the risks of tunneled dialysis catheter placement include but are not limited to: bleeding, infection, central venous injury, pneumothorax, possible venous stenosis, possible malpositioning in the venous system, and possible infections related to long-term catheter presence.  The patient was aware of these risks and agreed to proceed.  DESCRIPTION: After written full informed consent was obtained from the patient, the patient was taken back to the operating room.  Prior to induction, the patient was given IV antibiotics.  After obtaining adequate sedation, the patient was prepped and draped in the standard fashion for a chest or neck tunneled dialysis catheter placement.   The cannulation site, the catheter exit site, and tract for the subcutaneous tunnel were then anesthestized with a total of 20 cc of a 1:1 mixture of 0.5% Marcaine without epinepherine and 1% Lidocaine with epinepherine.  Under ultrasound guidance, the right internal jugular vein was cannulated with the 18 gauge needle.  A J-wire was then placed down into the inferior vena cava under fluoroscopic guidance.  The wire was then secured in place with a clamp to the drapes.  I then made stab incisions at the neck and exit sites.   I dissected from the exit site to the  cannulation site with a metal tunneler.   The subcutaneous tunnel was dilated by passing a plastic dilator over the metal dissector. The wire was then unclamped and I removed the needle.  The skin tract and venotomy was dilated serially with dilators.  Finally, the dilator-sheath was placed under fluoroscopic guidance into the superior vena cava.  The dilator and wire were removed.  A 23 cm Diatek catheter was placed under fluoroscopic guidance down into the right atrium.  The sheath was broken and peeled away while holding the catheter cuff at the level of the skin.  The back end of this catheter was transected, revealing the two lumens of this catheter.  The ports were docked onto these two lumens.  The catheter hub was then screwed into place.  Each port was tested by aspirating and flushing.  No resistance was noted.  Each port was then thoroughly flushed with heparinized saline.  The catheter was secured in placed with two interrupted stitches of 3-0 Nylon tied to the catheter.  The neck incision was closed with a U-stitch of 4-0 Monocryl.  The neck and chest incision were cleaned and sterile bandages applied.  Each port was then loaded with concentrated heparin (1000 Units/mL) at the manufacturer recommended volumes to each port.  Sterile caps were applied to each port.  On completion fluoroscopy, the tips of the catheter were in the right atrium, and there was no evidence of pneumothorax.   COMPLICATIONS: none  CONDITION: stable   Adele Barthel, MD [facility] Office: [phone] Pager: [phone]  05/21/2015, 8:54 AM

## 2015-05-21 NOTE — Anesthesia Preprocedure Evaluation (Addendum)
Anesthesia Evaluation  Patient identified by MRN, date of birth, ID band Patient awake    Reviewed: Allergy & Precautions, NPO status , Patient's Chart, lab work & pertinent test results  Airway Mallampati: II  TM Distance: >3 FB Neck ROM: Full    Dental  (+) Teeth Intact, Dental Advisory Given   Pulmonary  breath sounds clear to auscultation        Cardiovascular + CAD Rhythm:Regular Rate:Normal     Neuro/Psych    GI/Hepatic   Endo/Other    Renal/GU DialysisRenal disease     Musculoskeletal   Abdominal   Peds  Hematology   Anesthesia Other Findings   Reproductive/Obstetrics                             Anesthesia Physical Anesthesia Plan  ASA: III  Anesthesia Plan: MAC   Post-op Pain Management:    Induction: Intravenous  Airway Management Planned: Simple Face Mask  Additional Equipment:   Intra-op Plan:   Post-operative Plan:   Informed Consent: I have reviewed the patients History and Physical, chart, labs and discussed the procedure including the risks, benefits and alternatives for the proposed anesthesia with the patient or authorized representative who has indicated his/her understanding and acceptance.   Dental advisory given  Plan Discussed with: CRNA, Anesthesiologist and Surgeon  Anesthesia Plan Comments:         Anesthesia Quick Evaluation

## 2015-05-21 NOTE — H&P (View-Only) (Signed)
Brief History and Physical  History of Present Illness  Diane [name] is a 80 y.o. female who presents with chief complaint: end stage renal disease with rethrombosis.  This patient recently had a T&R on 05/12/15.  The patient presents today for TE LUA AVG, possible TDC placement.    Past Medical History  Diagnosis Date  . Anemia in chronic kidney disease(285.21)   . Bipolar disorder, unspecified   . Iron deficiency anemia, unspecified   . Secondary hyperparathyroidism (of renal origin)   . Restless legs syndrome (RLS)   . Unspecified hypertensive kidney disease with chronic kidney disease stage V or end stage renal disease   . 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 Personal history of colonic polyps   . Diverticulosis of colon (without mention of hemorrhage) 2006    Colonoscopy Dr. Oletta Lamas  . Coronary artery disease   . S/P CABG x 5, 08/15/12, LIMA-LAD;VG-diag;VG-OM; seq VG-PDA,PLA  08/18/2012  . Anxiety   . Depression   . Anginal pain     has ntg but has never used them  . Kidney stone   . Arthritis     right hip  . Cancer     melanoma - ankle (left)    Past Surgical History  Procedure Laterality Date  . Arteriovenous graft placement  11/2010    left upper - AVGG Dr. Donnetta Hutching  . Arteriovenous graft placement  12/2009    left lower - AVGG Dr. Donnetta Hutching  . Av fistula placement  07/2009    right upper AVF Dr. Donnetta Hutching  . Av fistula placement  01/2002    right lower Dr. Donnetta Hutching  . Cardiac catheterization  08/12/2012  . Abdominal hysterectomy    . Coronary artery bypass graft  08/14/2012    Procedure: CORONARY ARTERY BYPASS GRAFTING (CABG);  Surgeon: Gaye Pollack, MD;  Location: [facility];  Service: Open Heart Surgery;  Laterality: N/A;  times five, using left internal mammary  . Endovein harvest of greater saphenous vein  08/14/2012    Procedure: ENDOVEIN HARVEST OF GREATER SAPHENOUS VEIN;  Surgeon: Gaye Pollack,  MD;  Location: [place];  Service: Open Heart Surgery;  Laterality: Bilateral;  . Eye surgery    . Left and right heart catheterization with coronary/graft angiogram  08/12/2012    Procedure: LEFT AND RIGHT HEART CATHETERIZATION WITH Beatrix Fetters;  Surgeon: Troy Sine, MD;  Location: [facility] CATH LAB;  Service: Cardiovascular;;  . Revision of arteriovenous goretex graft Left 02/27/2015    Procedure: EXCISION OF ULCER AND REVISION OF LEFT UPPER ARM ARTERIOVENOUS GORETEX GRAFT;  Surgeon: Rosetta Posner, MD;  Location: [place];  Service: Vascular;  Laterality: Left;  . Revision of arteriovenous goretex graft Left 04/12/2015    Procedure: REVISION WITH INSERTION OF NEW 6 MM GORTEX GRAFT ,REMOVAL INFECTED SEGMENT  AV GORTEX GRAFT LEFT UPPER ARM;  Surgeon: Mal Misty, MD;  Location: [place];  Service: Vascular;  Laterality: Left;  . Thrombectomy w/ embolectomy Left 05/12/2015    Procedure: THROMBECTOMY ARTERIOVENOUS GORE-TEX GRAFT WITHREVISION VENOUS END LEFT UPPER ARM;  Surgeon: Mal Misty, MD;  Location: [place];  Service: Vascular;  Laterality: Left;    History   Social History  . Marital Status: Married    Spouse Name: N/A  . Number of Children: N/A  . Years of Education: N/A   Occupational History  . Not on file.  Social History Main Topics  . Smoking status: Never Smoker   . Smokeless tobacco: Never Used  . Alcohol Use: No  . Drug Use: No  . Sexual Activity: No   Other Topics Concern  . Not on file   Social History Narrative    History reviewed. No pertinent family history.  No current facility-administered medications on file prior to encounter.   Current Outpatient Prescriptions on File Prior to Encounter  Medication Sig Dispense Refill  . aspirin 81 MG tablet Take 81 mg by mouth See admin instructions. Only take on mondays, wed, fridays, sundays    . calcium acetate (PHOSLO) 667 MG capsule Take 667 mg by mouth 3 (three) times daily with meals.     Marland Kitchen ceFAZolin  (ANCEF) 2-3 GM-% SOLR Inject 50 mLs (2 g total) into the vein Every Tuesday,Thursday,and Saturday with dialysis. 50 mL 0  . clorazepate (TRANXENE) 3.75 MG tablet Take 3.75 mg by mouth 2 (two) times daily.     . Darbepoetin Alfa (ARANESP) 60 MCG/0.3ML SOSY injection Inject 0.3 mLs (60 mcg total) into the vein every Saturday with hemodialysis. 4.2 mL 0  . gabapentin (NEURONTIN) 100 MG capsule Take 200 mg by mouth at bedtime. Takes 1 tablet after dialysis, then 2 tabs at bedtime    . hydrOXYzine (ATARAX/VISTARIL) 50 MG tablet Take 50 mg by mouth at bedtime.     . midodrine (PROAMATINE) 10 MG tablet Take 1 tablet (10 mg total) by mouth 2 (two) times daily with a meal. 60 tablet 0  . polyethylene glycol (MIRALAX / GLYCOLAX) packet Take 17 g by mouth daily as needed. For constipation    . Polyvinyl Alcohol-Povidone (REFRESH OP) Place 1 tablet into both eyes daily as needed. For dry eyes    . acetaminophen (TYLENOL) 500 MG tablet Take 500 mg by mouth every 6 (six) hours as needed.    Marland Kitchen NITROSTAT 0.4 MG SL tablet Place 0.4 mg under the tongue every 5 (five) minutes as needed.     Marland Kitchen oxyCODONE (ROXICODONE) 5 MG immediate release tablet Take 1 tablet (5 mg total) by mouth every 6 (six) hours as needed. 20 tablet 0    Allergies  Allergen Reactions  . Hydrocodone-Acetaminophen Itching  . Ambien [Zolpidem Tartrate]     Stays awake  . Amoxicillin Diarrhea  . Clavulanic Acid   . Sulfa Antibiotics Other (See Comments)    Migraine   . Latex Rash  . Tape Rash    Can only use paper tape     Review of Systems: As listed above, otherwise negative.  Physical Examination  Filed Vitals:   05/17/15 0902 05/17/15 0910  BP:  138/61  Pulse:  82  Temp:  97 F (36.1 C)  TempSrc:  Oral  Resp:  18  Height:  5\' 1"  (1.549 m)  Weight: 117 lb 1 oz (53.1 kg)   SpO2:  100%    General: A&O x 3, WDWN  Pulmonary: Sym exp, good air movt, CTAB, no rales, rhonchi, & wheezing  Cardiac: RRR, Nl S1, S2, no Murmurs,  rubs or gallops  Gastrointestinal: soft, NTND, -G/R, - HSM, - masses, - CVAT B  Musculoskeletal: M/S 5/5 throughout , Extremities without ischemic changes , multiple thrombosed LUA AVG  Laboratory See iStat  Medical Decision Making  Diane [name] is a 80 y.o. female who presents with: ESRD, thrombosed LUA AVG.   The patient is scheduled for: TE LUA AVG, possible TDC placement  Risk, benefits, and alternatives to  access surgery were discussed.  The patient is aware the risks include but are not limited to: bleeding, infection, steal syndrome, nerve damage, ischemic monomelic neuropathy, failure to mature, and need for additional procedures. The patient is aware the risks of tunneled dialysis catheter placement include but are not limited to: bleeding, infection, central venous injury, pneumothorax, possible venous stenosis, possible malpositioning in the venous system, and possible infections related to long-term catheter presence.   The patient is aware of the risks and agrees to proceed.  Adele Barthel, MD [facility] [place] [facility] Office: [phone] Pager: [phone]  05/17/2015, 12:03 PM

## 2015-05-21 NOTE — Transfer of Care (Signed)
Immediate Anesthesia Transfer of Care Note  Patient: Diane [name]  Procedure(s) Performed: Procedure(s): INSERTION OF DIALYSIS CATHETER Right Internal Jugular (Right)  Patient Location: PACU  Anesthesia Type:MAC  Level of Consciousness: awake  Airway & Oxygen Therapy: Patient Spontanous Breathing  Post-op Assessment: Report given to RN and Post -op Vital signs reviewed and stable  Post vital signs: Reviewed and stable  Last Vitals:  Filed Vitals:   05/21/15 0735  BP: 129/71  Pulse: 64  Temp: 56.7 C    Complications: No apparent anesthesia complications

## 2015-05-22 ENCOUNTER — Encounter (HOSPITAL_COMMUNITY): Payer: Self-pay | Admitting: Vascular Surgery

## 2015-05-22 ENCOUNTER — Other Ambulatory Visit: Payer: Self-pay | Admitting: *Deleted

## 2015-05-22 DIAGNOSIS — N186 End stage renal disease: Secondary | ICD-10-CM

## 2015-05-23 ENCOUNTER — Telehealth: Payer: Self-pay | Admitting: Vascular Surgery

## 2015-05-23 NOTE — Telephone Encounter (Signed)
LM for pt re appt, dpm °

## 2015-05-23 NOTE — Telephone Encounter (Signed)
-----   Message from Mena Goes, RN sent at 05/22/2015 10:54 AM EDT ----- Regarding: Schedule   ----- Message -----    From: Dario Ave    Sent: 05/22/2015   6:58 AM      To: [address], [facility] Pool Subject: Vicente Masson log                                         ----- Message -----    From: Conrad [place], MD    Sent: 05/21/2015   8:44 AM      To: [address] SAMMANTHA MEHLHAFF [phone] 1934/10/22   PROCEDURE: 1. Right internal jugular vein tunneled dialysis catheter placement 2. Right internal jugular vein cannulation under ultrasound guidance  Follow-up: 2 weeks with next available provider  Orders(s) for follow-up:  1.  R arm vein mapping 2.  R arterial doppler (access evaluation)

## 2015-05-30 ENCOUNTER — Ambulatory Visit: Payer: Medicare Other | Admitting: Physician Assistant

## 2015-06-02 ENCOUNTER — Encounter (HOSPITAL_COMMUNITY): Payer: Medicare Other

## 2015-06-02 ENCOUNTER — Other Ambulatory Visit (HOSPITAL_COMMUNITY): Payer: Medicare Other

## 2015-06-02 NOTE — Progress Notes (Signed)
Rec'd phone call from pt's daughter on 06/01/15 to cancel appts for new dialysis access evaluation; reported pt. has decided not to continue dialysis treatment, and has had her dialysis catheter removed.  Notified Rayla @ [facility]; confirmed that pt. Signed-off of dialysis.  Rec'd information from [facility] that pt's dialysis cath. was removed on 05/29/15 per pt. request.

## 2015-06-06 ENCOUNTER — Encounter: Payer: Medicare Other | Admitting: Vascular Surgery

## 2015-07-22 DEATH — deceased

## 2022-03-04 NOTE — Telephone Encounter (Signed)
Closing note
